# Patient Record
Sex: Female | Born: 1955 | Race: White | Hispanic: No | Marital: Married | Smoking: Former smoker
Health system: Southern US, Community
[De-identification: ages and names within clinical notes are randomized; demographics above are authoritative.]

## PROBLEM LIST (undated history)

## (undated) DIAGNOSIS — E559 Vitamin D deficiency, unspecified: Secondary | ICD-10-CM

## (undated) DIAGNOSIS — E785 Hyperlipidemia, unspecified: Secondary | ICD-10-CM

## (undated) DIAGNOSIS — E0500 Thyrotoxicosis with diffuse goiter without thyrotoxic crisis or storm: Secondary | ICD-10-CM

## (undated) DIAGNOSIS — M7070 Other bursitis of hip, unspecified hip: Secondary | ICD-10-CM

## (undated) DIAGNOSIS — M25562 Pain in left knee: Secondary | ICD-10-CM

## (undated) DIAGNOSIS — I739 Peripheral vascular disease, unspecified: Secondary | ICD-10-CM

## (undated) DIAGNOSIS — I714 Abdominal aortic aneurysm, without rupture, unspecified: Secondary | ICD-10-CM

## (undated) DIAGNOSIS — I809 Phlebitis and thrombophlebitis of unspecified site: Secondary | ICD-10-CM

## (undated) DIAGNOSIS — Z86010 Personal history of colonic polyps: Secondary | ICD-10-CM

## (undated) DIAGNOSIS — E039 Hypothyroidism, unspecified: Secondary | ICD-10-CM

## (undated) DIAGNOSIS — M1990 Unspecified osteoarthritis, unspecified site: Secondary | ICD-10-CM

## (undated) DIAGNOSIS — Z0000 Encounter for general adult medical examination without abnormal findings: Secondary | ICD-10-CM

## (undated) DIAGNOSIS — I10 Essential (primary) hypertension: Secondary | ICD-10-CM

## (undated) DIAGNOSIS — R700 Elevated erythrocyte sedimentation rate: Secondary | ICD-10-CM

## (undated) DIAGNOSIS — I779 Disorder of arteries and arterioles, unspecified: Secondary | ICD-10-CM

## (undated) HISTORY — DX: Pain in left knee: M25.562

## (undated) HISTORY — DX: Encounter for general adult medical examination without abnormal findings: Z00.00

## (undated) HISTORY — DX: Disorder of arteries and arterioles, unspecified: I77.9

## (undated) HISTORY — DX: Thyrotoxicosis with diffuse goiter without thyrotoxic crisis or storm: E05.00

## (undated) HISTORY — DX: Personal history of colonic polyps: Z86.010

## (undated) HISTORY — PX: OOPHORECTOMY: SHX86

## (undated) HISTORY — DX: Abdominal aortic aneurysm, without rupture: I71.4

## (undated) HISTORY — DX: Unspecified osteoarthritis, unspecified site: M19.90

## (undated) HISTORY — DX: Phlebitis and thrombophlebitis of unspecified site: I80.9

## (undated) HISTORY — DX: Elevated erythrocyte sedimentation rate: R70.0

## (undated) HISTORY — DX: Essential (primary) hypertension: I10

## (undated) HISTORY — DX: Vitamin D deficiency, unspecified: E55.9

## (undated) HISTORY — DX: Other bursitis of hip, unspecified hip: M70.70

## (undated) HISTORY — DX: Abdominal aortic aneurysm, without rupture, unspecified: I71.40

## (undated) HISTORY — PX: COLONOSCOPY: SHX174

## (undated) HISTORY — DX: Hyperlipidemia, unspecified: E78.5

## (undated) HISTORY — DX: Peripheral vascular disease, unspecified: I73.9

## (undated) HISTORY — PX: POLYPECTOMY: SHX149

## (undated) HISTORY — DX: Hypothyroidism, unspecified: E03.9

---

## 1998-06-29 ENCOUNTER — Other Ambulatory Visit: Admission: RE | Admit: 1998-06-29 | Discharge: 1998-06-29 | Payer: Self-pay | Admitting: *Deleted

## 1998-11-30 ENCOUNTER — Other Ambulatory Visit: Admission: RE | Admit: 1998-11-30 | Discharge: 1998-11-30 | Payer: Self-pay | Admitting: *Deleted

## 1999-06-26 ENCOUNTER — Other Ambulatory Visit: Admission: RE | Admit: 1999-06-26 | Discharge: 1999-06-26 | Payer: Self-pay | Admitting: *Deleted

## 1999-07-10 ENCOUNTER — Ambulatory Visit (HOSPITAL_COMMUNITY): Admission: RE | Admit: 1999-07-10 | Discharge: 1999-07-10 | Payer: Self-pay | Admitting: *Deleted

## 1999-07-10 ENCOUNTER — Encounter: Payer: Self-pay | Admitting: *Deleted

## 1999-10-02 HISTORY — PX: OTHER SURGICAL HISTORY: SHX169

## 1999-12-13 ENCOUNTER — Other Ambulatory Visit: Admission: RE | Admit: 1999-12-13 | Discharge: 1999-12-13 | Payer: Self-pay | Admitting: *Deleted

## 2000-06-20 ENCOUNTER — Ambulatory Visit (HOSPITAL_BASED_OUTPATIENT_CLINIC_OR_DEPARTMENT_OTHER): Admission: RE | Admit: 2000-06-20 | Discharge: 2000-06-20 | Payer: Self-pay | Admitting: Oral Surgery

## 2000-07-05 ENCOUNTER — Other Ambulatory Visit: Admission: RE | Admit: 2000-07-05 | Discharge: 2000-07-05 | Payer: Self-pay | Admitting: *Deleted

## 2000-11-27 ENCOUNTER — Encounter: Payer: Self-pay | Admitting: *Deleted

## 2000-11-27 ENCOUNTER — Ambulatory Visit (HOSPITAL_COMMUNITY): Admission: RE | Admit: 2000-11-27 | Discharge: 2000-11-27 | Payer: Self-pay | Admitting: *Deleted

## 2001-02-12 ENCOUNTER — Other Ambulatory Visit: Admission: RE | Admit: 2001-02-12 | Discharge: 2001-02-12 | Payer: Self-pay | Admitting: *Deleted

## 2001-07-09 ENCOUNTER — Other Ambulatory Visit: Admission: RE | Admit: 2001-07-09 | Discharge: 2001-07-09 | Payer: Self-pay | Admitting: *Deleted

## 2002-09-03 ENCOUNTER — Other Ambulatory Visit: Admission: RE | Admit: 2002-09-03 | Discharge: 2002-09-03 | Payer: Self-pay | Admitting: *Deleted

## 2002-11-09 ENCOUNTER — Ambulatory Visit: Admission: RE | Admit: 2002-11-09 | Discharge: 2002-11-09 | Payer: Self-pay | Admitting: Internal Medicine

## 2002-11-11 ENCOUNTER — Ambulatory Visit: Admission: RE | Admit: 2002-11-11 | Discharge: 2002-11-11 | Payer: Self-pay | Admitting: Internal Medicine

## 2002-11-11 ENCOUNTER — Encounter: Payer: Self-pay | Admitting: Cardiology

## 2002-11-30 DIAGNOSIS — Z860100 Personal history of colon polyps, unspecified: Secondary | ICD-10-CM

## 2002-11-30 HISTORY — DX: Personal history of colonic polyps: Z86.010

## 2002-11-30 HISTORY — DX: Personal history of colon polyps, unspecified: Z86.0100

## 2003-04-14 ENCOUNTER — Encounter: Payer: Self-pay | Admitting: *Deleted

## 2003-04-14 ENCOUNTER — Encounter: Admission: RE | Admit: 2003-04-14 | Discharge: 2003-04-14 | Payer: Self-pay | Admitting: *Deleted

## 2003-07-05 ENCOUNTER — Ambulatory Visit (HOSPITAL_COMMUNITY): Admission: RE | Admit: 2003-07-05 | Discharge: 2003-07-05 | Payer: Self-pay | Admitting: Obstetrics and Gynecology

## 2003-07-05 ENCOUNTER — Ambulatory Visit (HOSPITAL_BASED_OUTPATIENT_CLINIC_OR_DEPARTMENT_OTHER): Admission: RE | Admit: 2003-07-05 | Discharge: 2003-07-05 | Payer: Self-pay | Admitting: Obstetrics and Gynecology

## 2003-09-16 ENCOUNTER — Other Ambulatory Visit: Admission: RE | Admit: 2003-09-16 | Discharge: 2003-09-16 | Payer: Self-pay | Admitting: *Deleted

## 2003-12-17 ENCOUNTER — Ambulatory Visit (HOSPITAL_COMMUNITY): Admission: RE | Admit: 2003-12-17 | Discharge: 2003-12-17 | Payer: Self-pay | Admitting: *Deleted

## 2004-01-21 ENCOUNTER — Ambulatory Visit (HOSPITAL_COMMUNITY): Admission: RE | Admit: 2004-01-21 | Discharge: 2004-01-21 | Payer: Self-pay | Admitting: Internal Medicine

## 2004-02-01 ENCOUNTER — Emergency Department (HOSPITAL_COMMUNITY): Admission: EM | Admit: 2004-02-01 | Discharge: 2004-02-01 | Payer: Self-pay | Admitting: Emergency Medicine

## 2004-06-13 ENCOUNTER — Ambulatory Visit (HOSPITAL_COMMUNITY): Admission: RE | Admit: 2004-06-13 | Discharge: 2004-06-13 | Payer: Self-pay | Admitting: Internal Medicine

## 2004-07-19 ENCOUNTER — Encounter: Admission: RE | Admit: 2004-07-19 | Discharge: 2004-07-19 | Payer: Self-pay | Admitting: Orthopedic Surgery

## 2004-09-19 ENCOUNTER — Other Ambulatory Visit: Admission: RE | Admit: 2004-09-19 | Discharge: 2004-09-19 | Payer: Self-pay | Admitting: *Deleted

## 2004-10-17 ENCOUNTER — Encounter: Admission: RE | Admit: 2004-10-17 | Discharge: 2004-10-17 | Payer: Self-pay | Admitting: *Deleted

## 2005-05-16 ENCOUNTER — Emergency Department (HOSPITAL_COMMUNITY): Admission: EM | Admit: 2005-05-16 | Discharge: 2005-05-16 | Payer: Self-pay | Admitting: Emergency Medicine

## 2005-09-18 ENCOUNTER — Other Ambulatory Visit: Admission: RE | Admit: 2005-09-18 | Discharge: 2005-09-18 | Payer: Self-pay | Admitting: Obstetrics and Gynecology

## 2005-10-08 ENCOUNTER — Encounter: Admission: RE | Admit: 2005-10-08 | Discharge: 2005-10-08 | Payer: Self-pay | Admitting: Obstetrics and Gynecology

## 2005-11-16 ENCOUNTER — Encounter (INDEPENDENT_AMBULATORY_CARE_PROVIDER_SITE_OTHER): Payer: Self-pay | Admitting: Specialist

## 2005-11-16 ENCOUNTER — Ambulatory Visit (HOSPITAL_COMMUNITY): Admission: RE | Admit: 2005-11-16 | Discharge: 2005-11-16 | Payer: Self-pay | Admitting: Obstetrics and Gynecology

## 2006-01-15 ENCOUNTER — Encounter (INDEPENDENT_AMBULATORY_CARE_PROVIDER_SITE_OTHER): Payer: Self-pay | Admitting: Specialist

## 2006-01-15 ENCOUNTER — Inpatient Hospital Stay (HOSPITAL_COMMUNITY): Admission: RE | Admit: 2006-01-15 | Discharge: 2006-01-16 | Payer: Self-pay | Admitting: Obstetrics and Gynecology

## 2006-03-29 ENCOUNTER — Ambulatory Visit (HOSPITAL_COMMUNITY): Admission: RE | Admit: 2006-03-29 | Discharge: 2006-03-29 | Payer: Self-pay | Admitting: Internal Medicine

## 2006-10-17 ENCOUNTER — Encounter: Admission: RE | Admit: 2006-10-17 | Discharge: 2006-10-17 | Payer: Self-pay | Admitting: Obstetrics and Gynecology

## 2007-09-10 ENCOUNTER — Ambulatory Visit: Payer: Self-pay | Admitting: Vascular Surgery

## 2007-10-02 HISTORY — PX: VAGINAL HYSTERECTOMY: SUR661

## 2007-12-09 ENCOUNTER — Encounter: Admission: RE | Admit: 2007-12-09 | Discharge: 2007-12-09 | Payer: Self-pay | Admitting: Internal Medicine

## 2008-06-22 ENCOUNTER — Ambulatory Visit (HOSPITAL_COMMUNITY): Admission: RE | Admit: 2008-06-22 | Discharge: 2008-06-22 | Payer: Self-pay | Admitting: Internal Medicine

## 2008-06-23 ENCOUNTER — Telehealth: Payer: Self-pay | Admitting: Internal Medicine

## 2008-06-30 ENCOUNTER — Ambulatory Visit: Payer: Self-pay | Admitting: Internal Medicine

## 2008-07-09 ENCOUNTER — Ambulatory Visit: Payer: Self-pay | Admitting: Internal Medicine

## 2008-09-21 ENCOUNTER — Ambulatory Visit: Payer: Self-pay | Admitting: Vascular Surgery

## 2009-01-06 DIAGNOSIS — E039 Hypothyroidism, unspecified: Secondary | ICD-10-CM

## 2009-01-07 ENCOUNTER — Encounter: Payer: Self-pay | Admitting: Cardiovascular Disease

## 2009-01-07 ENCOUNTER — Ambulatory Visit: Payer: Self-pay | Admitting: Cardiovascular Disease

## 2009-01-11 ENCOUNTER — Encounter: Admission: RE | Admit: 2009-01-11 | Discharge: 2009-01-11 | Payer: Self-pay | Admitting: Internal Medicine

## 2009-01-20 ENCOUNTER — Ambulatory Visit: Payer: Self-pay

## 2009-01-27 ENCOUNTER — Telehealth: Payer: Self-pay | Admitting: Cardiovascular Disease

## 2009-04-11 ENCOUNTER — Ambulatory Visit: Payer: Self-pay | Admitting: Cardiovascular Disease

## 2009-04-11 DIAGNOSIS — R0989 Other specified symptoms and signs involving the circulatory and respiratory systems: Secondary | ICD-10-CM | POA: Insufficient documentation

## 2009-04-25 ENCOUNTER — Telehealth (INDEPENDENT_AMBULATORY_CARE_PROVIDER_SITE_OTHER): Payer: Self-pay

## 2009-04-26 ENCOUNTER — Encounter: Payer: Self-pay | Admitting: Cardiology

## 2009-04-26 ENCOUNTER — Encounter: Payer: Self-pay | Admitting: Cardiovascular Disease

## 2009-04-26 ENCOUNTER — Ambulatory Visit: Payer: Self-pay

## 2009-07-26 ENCOUNTER — Ambulatory Visit: Payer: Self-pay | Admitting: Cardiovascular Disease

## 2009-08-16 ENCOUNTER — Telehealth (INDEPENDENT_AMBULATORY_CARE_PROVIDER_SITE_OTHER): Payer: Self-pay | Admitting: *Deleted

## 2009-09-12 ENCOUNTER — Telehealth: Payer: Self-pay | Admitting: Cardiovascular Disease

## 2009-09-28 ENCOUNTER — Ambulatory Visit: Payer: Self-pay

## 2009-09-28 ENCOUNTER — Encounter: Payer: Self-pay | Admitting: Cardiovascular Disease

## 2010-01-12 ENCOUNTER — Ambulatory Visit: Payer: Self-pay | Admitting: Cardiovascular Disease

## 2010-06-06 ENCOUNTER — Encounter: Payer: Self-pay | Admitting: Cardiovascular Disease

## 2010-06-06 ENCOUNTER — Ambulatory Visit: Payer: Self-pay

## 2010-08-05 ENCOUNTER — Ambulatory Visit: Payer: Self-pay | Admitting: Diagnostic Radiology

## 2010-08-05 ENCOUNTER — Emergency Department (HOSPITAL_BASED_OUTPATIENT_CLINIC_OR_DEPARTMENT_OTHER): Admission: EM | Admit: 2010-08-05 | Discharge: 2010-08-05 | Payer: Self-pay | Admitting: Emergency Medicine

## 2010-10-22 ENCOUNTER — Encounter: Payer: Self-pay | Admitting: Internal Medicine

## 2010-10-31 NOTE — Assessment & Plan Note (Signed)
Summary: per check out/sf   Visit Type:  Follow-up  CC:  No cardiac complains.  History of Present Illness: Lindsay Hodges is seen today for F/U of carotid disease , HTN and elevated lipids  She has 40-59% RICA and 60-79% left ICA stenosis and needs a F/U duplex in June.  She has had no TIA symptoms.  The losartan has brought her BP down well.  She thinks her thyroid is low again as he skin is dry and she feels tired.  She just had her labs checked at Dr Hardie Pulley office.  She denies SSCP, palpitations, edema or dyspnea  Current Problems (verified): 1)  Essential Hypertension, Benign  (ICD-401.1) 2)  Carotid Bruit, Left  (ICD-785.9) 3)  Leg Edema  (ICD-782.3) 4)  Chest Pain Unspecified  (ICD-786.50) 5)  Hematometra  (ICD-621.4) 6)  Menorrhagia  (ICD-626.2) 7)  Hypothyroidism  (ICD-244.9)  Current Medications (verified): 1)  Synthroid 75 Mcg Tabs (Levothyroxine Sodium) .Marland Kitchen.. 1 Tab By Mouth Once Daily Except Sat 50 Mcg 2)  Lipitor 10 Mg Tabs (Atorvastatin Calcium) .Marland Kitchen.. 1 Tab By Mouth Once Daily 3)  Estrace 0.5 Mg Tabs (Estradiol) .Marland Kitchen.. 1 Tab By Mouth Once Daily 4)  Aspirin 81 Mg  Tabs (Aspirin) .Marland Kitchen.. 1 Tab By Mouth Once Daily 5)  Losartan Potassium 50 Mg Tabs (Losartan Potassium) .... One Tablet By Mouth Once Daily  Allergies: 1)  ! Penicillin  Past History:  Past Medical History: Last updated: 04/11/2009 HEMATOMETRA (ICD-621.4) MENORRHAGIA (ICD-626.2) HYPOTHYROIDISM (ICD-244.9) Carotid Bruit Edema  Past Surgical History: Last updated: 01/06/2009 Jaw surgery.  Family History: Last updated: 04/11/2009 non-contributory  Social History: Last updated: 04/11/2009 Tobacco Use - Yes.  Alcohol Use - no Drug Use - no Works at [employer]  Review of Systems       Denies fever, malais, weight loss, blurry vision, decreased visual acuity, cough, sputum, SOB, hemoptysis, pleuritic pain, palpitaitons, heartburn, abdominal pain, melena, lower extremity edema, claudication, or  rash.   Vital Signs:  Patient profile:   55 year old female Height:      64 inches Weight:      163.31 pounds BMI:     28.13 Pulse rate:   75 / minute Pulse rhythm:   regular Resp:     18 per minute BP sitting:   146 / 64  (left arm) Cuff size:   large  Vitals Entered By: Kem Parkinson (January 12, 2010 4:43 PM)  Physical Exam  General:  Affect appropriate Healthy:  appears stated age HEENT: normal Neck supple with no adenopathy JVP normal no bruits no thyromegaly Lungs clear with no wheezing and good diaphragmatic motion Heart:  S1/S2 no murmur,rub, gallop or click PMI normal Abdomen: benighn, BS positve, no tenderness, no AAA no bruit.  No HSM or HJR Distal pulses intact with no bruits No edema Neuro non-focal Skin warm and dry    Impression & Recommendations:  Problem # 1:  ESSENTIAL HYPERTENSION, BENIGN (ICD-401.1) Improved on ARB Her updated medication list for this problem includes:    Aspirin 81 Mg Tabs (Aspirin) .Marland Kitchen... 1 tab by mouth once daily    Losartan Potassium 50 Mg Tabs (Losartan potassium) ..... One tablet by mouth once daily  Problem # 2:  CAROTID BRUIT, LEFT (ICD-785.9) F/U duplex in June Continue antiplatlet Rx Orders: Carotid Duplex (Carotid Duplex)  Problem # 3:  HYPOTHYROIDISM (ICD-244.9) F/U Dr Elisabeth Most  Improved TSH will help lipid profile Her updated medication list for this problem includes:    Synthroid  75 Mcg Tabs (Levothyroxine sodium) .Marland Kitchen... 1 tab by mouth once daily except sat 50 mcg  Problem # 4:  MIXED HYPERLIPIDEMIA (ICD-272.2) Continue statin.  Labs per Dr Elisabeth Most.  Target LDL less than 70 Her updated medication list for this problem includes:    Lipitor 10 Mg Tabs (Atorvastatin calcium) .Marland Kitchen... 1 tab by mouth once daily  Patient Instructions: 1)  Your physician recommends that you schedule a follow-up appointment in: ONE YEAR 2)  Your physician has requested that you have a carotid duplex. This test is an ultrasound of  the carotid arteries in your neck. It looks at blood flow through these arteries that supply the brain with blood. Allow one hour for this exam. There are no restrictions or special instructions.

## 2010-12-22 ENCOUNTER — Encounter: Payer: Self-pay | Admitting: *Deleted

## 2011-01-03 ENCOUNTER — Ambulatory Visit (INDEPENDENT_AMBULATORY_CARE_PROVIDER_SITE_OTHER): Payer: BC Managed Care – PPO | Admitting: Cardiovascular Disease

## 2011-01-03 ENCOUNTER — Encounter: Payer: Self-pay | Admitting: Cardiovascular Disease

## 2011-01-03 DIAGNOSIS — E782 Mixed hyperlipidemia: Secondary | ICD-10-CM

## 2011-01-03 DIAGNOSIS — R079 Chest pain, unspecified: Secondary | ICD-10-CM

## 2011-01-03 DIAGNOSIS — R0989 Other specified symptoms and signs involving the circulatory and respiratory systems: Secondary | ICD-10-CM

## 2011-01-03 DIAGNOSIS — I10 Essential (primary) hypertension: Secondary | ICD-10-CM

## 2011-01-03 NOTE — Assessment & Plan Note (Signed)
Labs with new primary Target LDL 100 or less

## 2011-01-03 NOTE — Assessment & Plan Note (Signed)
Continue home monitoring and meds.

## 2011-01-03 NOTE — Progress Notes (Signed)
Lindsay Hodges is seen today for F/U of carotid disease , HTN and elevated lipids  She has 40-59% RICA and 60-79% left ICA stenosis and needs a F/U duplex  now  She has had no TIA symptoms.  The losartan has brought her BP down well.  She thinks her thyroid is low again as he skin is dry and she feels tired.  She just had her labs checked at Dr Hardie Pulley office.  She denies SSCP, palpitations, edema or dyspnea  She has a white coat component but BP's good at home  Instructed on low sodium diet  Now seeing Dr Sherlyn Lick at [facility] as primary.  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  Encouraged her to get lab work with new primary next month  ROS: Denies fever, malais, weight loss, blurry vision, decreased visual acuity, cough, sputum, SOB, hemoptysis, pleuritic pain, palpitaitons, heartburn, abdominal pain, melena, lower extremity edema, claudication, or rash.   General: Affect appropriate Healthy:  appears stated age HEENT: normal Neck supple with no adenopathy JVP normal right  bruits no thyromegaly Lungs clear with no wheezing and good diaphragmatic motion Heart:  S1/S2 no murmur,rub, gallop or click PMI normal Abdomen: benighn, BS positve, no tenderness, no AAA no bruit.  No HSM or HJR Distal pulses intact with no bruits No edema Neuro non-focal Skin warm and dry No muscular weakness   Current Outpatient Prescriptions  Medication Sig Dispense Refill  . aspirin 81 MG tablet Take 81 mg by mouth daily.        Marland Kitchen atorvastatin (LIPITOR) 10 MG tablet Take 10 mg by mouth daily.        Marland Kitchen estradiol (ESTRACE) 0.5 MG tablet Take 1 mg by mouth daily.       Marland Kitchen levothyroxine (SYNTHROID, LEVOTHROID) 75 MCG tablet Take 75 mcg by mouth daily. Except for Saturday take 50 mcg.       . losartan (COZAAR) 50 MG tablet Take 50 mg by mouth daily.          Allergies  Penicillins  Electrocardiogram:  Assessment and Plan

## 2011-01-03 NOTE — Assessment & Plan Note (Signed)
No neuro symptoms  ASA  F/U duplex

## 2011-01-03 NOTE — Patient Instructions (Signed)
Your physician has requested that you have a carotid duplex. This test is an ultrasound of the carotid arteries in your neck. It looks at blood flow through these arteries that supply the brain with blood. Allow one hour for this exam. There are no restrictions or special instructions.  F/U Dr Eden Emms 1 year.

## 2011-01-15 ENCOUNTER — Other Ambulatory Visit: Payer: Self-pay | Admitting: Cardiology

## 2011-01-15 DIAGNOSIS — I6529 Occlusion and stenosis of unspecified carotid artery: Secondary | ICD-10-CM

## 2011-01-16 ENCOUNTER — Encounter (INDEPENDENT_AMBULATORY_CARE_PROVIDER_SITE_OTHER): Payer: BC Managed Care – PPO | Admitting: *Deleted

## 2011-01-16 ENCOUNTER — Other Ambulatory Visit: Payer: Self-pay | Admitting: *Deleted

## 2011-01-16 DIAGNOSIS — I6529 Occlusion and stenosis of unspecified carotid artery: Secondary | ICD-10-CM

## 2011-01-18 ENCOUNTER — Encounter: Payer: Self-pay | Admitting: Cardiovascular Disease

## 2011-02-13 NOTE — Procedures (Signed)
CAROTID DUPLEX EXAM   INDICATION:  Follow-up evaluation of known carotid artery disease.  Previous study performed on September 04, 2006 revealed a 1-39% right ICA  stenosis and a 60-79% left ICA stenosis.   HISTORY:  Diabetes:  No.  Cardiac:  No.  Hypertension:  No.  Smoking:  Quit in 2007.  Previous Surgery:  No.  CV History:  Patient has a history of left eye visual disturbance in  April, 2005.  Recently, she has complained of black spots in her right  eye approximately once a week, which last for an hour at a time.  Amaurosis Fugax No, Paresthesias No, Hemiparesis No                                       RIGHT             LEFT  Brachial systolic pressure:         128               128  Brachial Doppler waveforms:         Triphasic         Triphasic  Vertebral direction of flow:        Antegrade         Antegrade  DUPLEX VELOCITIES (cm/sec)  CCA peak systolic                   111               99  ECA peak systolic                   158               133  ICA peak systolic                   104               235  ICA end diastolic                   30                70  PLAQUE MORPHOLOGY:                  Mixed             Mixed  PLAQUE AMOUNT:                      Mild              Moderate  PLAQUE LOCATION:                    Proximal ICA      Proximal ICA   IMPRESSION:  1. 20-39% right internal carotid artery stenosis.  2. 60-79% left internal carotid artery stenosis.  3. No significant change from previous study performed on September 04, 2006.   ___________________________________________  Di Kindle. Edilia Bo, M.D.   MC/MEDQ  D:  09/10/2007  T:  09/11/2007  Job:  [id number]

## 2011-02-13 NOTE — Procedures (Signed)
CAROTID DUPLEX EXAM   INDICATION:  Follow-up known carotid artery disease.   HISTORY:  Diabetes:  No.  Cardiac:  No.  Hypertension:  No.  Smoking:  Quit.  Previous Surgery:  None.  CV History:  Amaurosis Fugax No, Paresthesias No, Hemiparesis No.                                       RIGHT             LEFT  Brachial systolic pressure:         168               138  Brachial Doppler waveforms:         Biphasic          Biphasic  Vertebral direction of flow:        Antegrade         Antegrade  DUPLEX VELOCITIES (cm/sec)  CCA peak systolic                   100               83  ECA peak systolic                   143               132  ICA peak systolic                   140               270  ICA end diastolic                   44                79  PLAQUE MORPHOLOGY:                  Heterogeneous     Heterogeneous  PLAQUE AMOUNT:                      Moderate          Moderate to severe  PLAQUE LOCATION:                    ICA and ECA       ICA and ECA   IMPRESSION:  1. A 60-79% stenosis noted in the left ICA.  2. A 40-59% stenosis noted in the right ICA.  3. Antegrade bilateral vertebral arteries.   ___________________________________________  Di Kindle. Edilia Bo, M.D.   MG/MEDQ  D:  09/21/2008  T:  09/21/2008  Job:  [id number]   cc:   Di Kindle. Edilia Bo, M.D.

## 2011-02-13 NOTE — Assessment & Plan Note (Signed)
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 Elisabeth Most for left  arm heaviness.  She has known vascular disease.  She has 60-79% left ICA  stenosis that is followed by Dr. Edilia Bo, this has been for about 4  years.  She describes having a vasculitity about 4 years ago with a sed  rate of about 280.  Apparently, she was not placed on prednisone.  I  suspect she had some sort of arteritis, but she says it resolved  spontaneously.  She lost a lot of weight at the time.   It bothers me a little bit, there was no unifying diagnosis.   Her left arm heaviness has been going on for a few months, it seems to  be getting worse.  It is not always exertional.  It can occur with  sitting, it is not particularly positional.  There is an aching  sensation that does radiate to her chest sometimes.  There is no right-  sided radiation.  It is not associated with shortness of breath or  diaphoresis.  Looking back through Dr. Adele Dan records, he does not  indicate any evidence of left subclavian steal.  The patient also had an  MRI and MRA on November 27, 2006, which I reviewed and there was no  evidence of intracerebral flow signal abnormalities.   Her last duplex that I have from Dr. Adele Dan office is 2007 and  vertebral flow was antegrade bilaterally.   REVIEW OF SYSTEMS:  Otherwise negative.   PAST MEDICAL HISTORY:  Remarkable for previous jaw surgery, previous  hysterectomy.  This episode of elevated sed rate, weight loss likely a  vasculitity.  Carotid bruits.  She has no documented coronary artery  disease.   ALLERGIES:  She is allergic to PENICILLIN.   SOCIAL HISTORY:   She tries to walk daily 30-45 minutes and bikes 3 times  a week.  She is happily married with 3 older children including a set of  twins.  She does not smoke or drink.  The patient is indicated as a  Associate Professor.   FAMILY HISTORY:  Father died at age 35, question heart related.  Mother  is still alive.   MEDICATIONS:  1. Synthroid 75 mcg a day.  2. Lipitor 10 a day.  3. Estrace.   PHYSICAL EXAMINATION:  GENERAL:  Remarkable for healthy-appearing female  in no distress.  VITAL SIGNS:  Blood pressure is 120/70 in each arm, pulse is 57 and  regular.  There is no pulse discrepancy, faint left carotid bruit,  lymphadenopathy, or JVP elevation.  LUNGS:  Clear.  Good diaphragmatic motion.  No wheezing.  S1 and S2.  CARDIAC:  Normal heart sounds.  PMI normal.  ABDOMEN:  Benign.  Bowel sounds positive.  No AAA, no tenderness, no  bruit.  No hepatosplenomegaly or hepatojugular reflux EXTREMITIES:  Distal pulses are intact.  No edema.  NEUROLOGIC:  Nonfocal.  SKIN:  Warm and dry.  MUSCULOSKELETAL:  No muscular weakness.   EKG shows sinus rhythm with nonspecific T-wave changes in lead III.   IMPRESSION:  1. Arm heaviness in the patient with known vascular disease,      nonspecific inferior EKG changes.  Followup exercise stress      Myoview.  Would recommend baby aspirin.  2. Hypercholesterolemia in the setting of carotid disease.  Continue      Lipitor.  Lipid and liver profile in 6 months.  LDL goal 100 or      less.  3. Hypothyroidism.  Continue Synthroid.  TSH and T4 per primary care      MD.  4. Postmenopausal.  Continue Estrace.   While the patient is here, we will check upper extremity arterial  Doppler, just to make sure there is no evidence of subclavian steal  progression.     Noralyn Pick. Eden Emms, MD, [facility]  Electronically Signed    PCN/MedQ  DD: 01/07/2009  DT: 01/07/2009  Job #: [id number]   cc:   Lovenia Kim, D.O.

## 2011-02-16 NOTE — Op Note (Signed)
   NAME:  Lindsay Hodges, Lindsay Hodges [facility]                         ACCOUNT NO.:  [id number]   MEDICAL RECORD NO.:  [id number]                   PATIENT TYPE:  AMB   LOCATION:  [facility]                                 FACILITY:  [facility]   PHYSICIAN:  Cynthia P. Romine, M.D.             DATE OF BIRTH:  [birth year]   DATE OF PROCEDURE:  07/05/2003  DATE OF DISCHARGE:                                 OPERATIVE REPORT   PREOPERATIVE DIAGNOSIS:  Menorrhagia.   POSTOPERATIVE DIAGNOSIS:  Menorrhagia.   PROCEDURE:  Endometrial ablation using the HydroThermAblator.   SURGEON:  Cynthia P. Romine, M.D.   ANESTHESIA:  General by LMA.   ESTIMATED BLOOD LOSS:  Minimal.   COMPLICATIONS:  None.   DESCRIPTION OF PROCEDURE:  The patient was taken to the operating room and  after the induction of adequate general anesthesia was placed in the dorsal  lithotomy position and prepped and draped in the usual fashion.  The bladder  was drained with a red rubber catheter.  The cervix was grasped on its  anterior lip with a single-tooth tenaculum.  The uterus sounded to 7 cm.  The cervix was dilated to a #25 Shawnie Pons.  The hysteroscope could get into the  endocervical canal but could not be passed easily.  It was felt due to the  angle of the canal.  A second single-tooth tenaculum was placed on the  posterior lip of the cervix to straighten out the canal, and the scope could  then be introduced easily.  Photographic documentation was taken to document  proper placement of the scope inside the endometrium.  Endometrial ablation  was then carried out using the HydroThermAblator according to the  [facility] specifications.  No complications occurred.  Postoperative  photographic documentation was taken of the endometrium after the ablation.  The instruments were removed from the vagina and the procedure was  terminated.  The patient tolerated it well, went in satisfactory condition  to postanesthesia recovery.                                   Cynthia P. Romine, M.D.    CPR/MEDQ  D:  07/05/2003  T:  07/05/2003  Job:  [id number]

## 2011-02-16 NOTE — Op Note (Signed)
[place]  Patient:    Lindsay Hodges, FORONDA [facility]                      MRN: [id number] Proc. Date: 06/20/00 Adm. Date:  [id number] Attending:  Leonie Man                           Operative Report  PREOPERATIVE DIAGNOSES: 1. 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 2. Mandibular sagittal deficiency with functional deformity.  POSTOPERATIVE DIAGNOSES: 1. 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 2. Mandibular sagittal deficiency with functional deformity.  PROCEDURE: 1. LeFort I maxillary osteotomy with superior repositioning. 2. Bilateral sagittal osteotomy of the mandible.  ANESTHESIA:  General.  SURGEON:  Dora Sims, M.D. and Hinton Dyer, D.D.S.  ESTIMATED BLOOD LOSS:  300 cc.  CONDITION AFTER SURGERY:  Good.  DESCRIPTION OF PROCEDURE:  Following preoperative medication, the patient was brought to the operating room in the supine position, in which she remained throughout the whole procedure.  She was intubated by a right nasal endotracheal tube and then turned 90 degrees to the anesthesia cart.  She was then prepped and draped in the usual fashion for an orthognathic procedure, and the mouth was suctioned out and packed open with a moist open 4 x 4 gauze around the endotracheal tube.  A round bur and copious irrigation were used to do an enameloplasty on two maxillary teeth.  Once this was completed, the split was checked for fit, and was found to fit well.  Xylocaine 2% with 1:100,000 epinephrine was infiltrated in the area of the right and left maxilla, a total of 6 Carpules were used 1.5 cc per Carpule.  A #15 blade was then used to make an incision from the distal of the first molar on the patients left side to the canine area on the right side.  The incision was then tied to the mucobuccal fold.  A full thickness mucoperiosteal flap was then elevated, exposing the lateral maxillary  wall.  Both nasal apertures were exposed and the anterior nasal spine was then fully exposed.  Two bur holes were made 10.0 mm apart in the first molar area on the left side.  Attention was then turned to the right side and a #15 blade was used to make an incision from the distal left first molar to the original incision.  Again a full thickness mucoperiosteal flap was elevated using a periosteal elevator. The incision was carried down to the pterygo-plate area bilaterally.  Then now going 3.0 mm above the roots of the teeth, the 10.0 mm reference points were placed in the first molar area on the right side, and once completed with a retractor in place, a reciprocating saw was used to make a cut from the pterygo-plate area to the nasal aperature on the right side, and then a second cut was made 3.5 mm above the first one, tapering down to the nasal aperture area, so that there was just a thin bur cut in the anterior region.  Attention was then turned to the left side and the same thing was done, doing a 3.5 mm cut above the original cut, tapering down to the saw cut in the anterior region.  Both areas of bone were removed with pickups, and the nasal septum was then cut through using a guarded osteotome.  Both pterygo-plates were then cut using a posterior plate osteotome, and  1.5 cc was infiltrated into the area of each pterygomaxillary canal, a total of 3 cc infiltrated. The soft tissues of the nose were elevated off of the floor of the nose in a lateral area when doing the saw cuts, and this was extended.  Now the maxilla was being pushed in an inferior direction, and was found to easily be brought down, suctioning out the clots as it went down.  The soft tissues of the nose were intact and did not need to be repaired.  Once the maxilla was fully freed up, the maxilla was then trimmed back with a side-cutting rongeur to remove any bony interferences, and any sharp areas.  Once completed,  the maxilla was placed in its predetermined position, and using the measured holes, they were measured so that the maxilla went up 3.5 mm posterior, right and left sides. A #24 gauge stainless steel wire was passed both holes on the right side, and both holes on the left side, and then tacked down, holding the maxilla in place.  In the anterior region, two L-shaped plates were placed bent to fit passively, and then four screws were placed on either side, holding the maxilla in place.  These holes were drilled to either a 5.0 or a 7.0 mm depth. All eight screws were found to be extremely stable, and the maxilla firmly in place.  Prior to downfracturing the maxilla, a split was made that was fabricated to th maxillary arch, placed, and wired to the maxillary arch wire using #26 gauge stainless steel wire.  A second overlay splint went on top of that as a check for the occlusion, and with the occlusion stabilized and the patient in intermaxillary fixation, the above maxillary cuts had been performed.  Now the intermaxillary fixation wires were cut, and the maxilla and mandible were articulated to make sure that the midlines were on, and that the maxilla was in the proper position.  With everything in good position, the soft tissues were closed primarily with #3-0 chromic sutures, beginning on the left side, ending in the midline on both sides.  A V-Y closure was also accomplished prior to doing this.  Attention was then turned to the mandible, and again 3 cc of 2% Xylocaine and 1:100,000 epinephrine was infiltrated on each side of the mandible in the ramus area on the medial aspect, along the ascending ramus.  The #15 blade was used to make an incision along the ascending ramus and a full thickness mucoperiosteal flap was elevated in the buccal lingual direction.  The medial aspect of the mandible was reflected so that the perialveolar nerve could be seen entering the mandible, and with a  Selden retractor protecting the soft tissues, a reciprocating saw was used to make a cut horizontal to the occlusal plane at a 45-degree angle.  Once this cut was complete, the same saw was used  to make a cut down the ascending ramus to the first and second molar region. The dissection was then carried down to allow a channel retractor to go around the inferior border, and again a cut was made from the inferior border up to the original osteotomy site.  Once completed, a spatula osteotome was tapped into position, and the mandible began splaying in a buccal lingual direction. Once the cut was finished using a Smith spread and a Market researcher, the nerve was found to be intact in the segment containing the teeth, now referred to as a distal segment.  The area  was irrigated out and packed off gently, and attention turned to the left side where the same procedure was done.  Again, the #15 blade was used to make an incision over the ascending ramus, and a full thickness mucoperiosteal flap was elevated, exposing the buccal and lingual aspect of the mandible.  Again the medial aspect was reflected so as to show the inferior alveolar nerve, and with the Selden in place, the reciprocating saw was used to make a 45-degree cut parallel to the occlusal plane halfway through the mandible.  Then the reciprocating saw was used to make a cut down the ascending ramus, and then to the area of the first and second molar region.  A vertical osteotomy was created with the same reciprocating saw after the placement of the channel retractor on the inferior border.  Once completed, spatula osteotomies were tapped along the incision until the mandible began splaying.  Again, the [employer] and a Market researcher were used to finish the split, and the nerve was found to be intact in the distal segment.  Once completed, the area was copiously irrigated out bilaterally, and the patients proximal segment was  pushed superior and posterior using a digital pressure extraorally and a gauge directed intraorally.  An Allis clamp was then used to clamp the two segments together on the right side, and now the area was checked for screw placement.  The patient was in intermaxillary fixation during this time using #24 gauge stainless steel wires.  Because a good right angle screw could not be placed, because of the soft tissues, a small stab incision was made on the skin, and blunt dissection was used to carry the dissection down to the osteotomy site. With the bur guard in place, a hole was drilled and then a 13.0 mm screw was screwed into place.  Two other holes were drilled distal to the first one, and all were tested, to make sure that they were stable.  Once the screws were screwed down into position, the attention was then turned to the left side, where a retractor was positioned, and three screws were placed using two 13.0 mm and one 11.0 mm screw into position.  Once these screws were screwed down, all six screws appeared to be firm.  Both osteotomy sites were tested for any mobility, and stability of the screws.  Then the soft tissues were closed with #3-0 chromic sutures.  The intermaxillary fixation wires were cut, and the occlusion was checked for stability.  The patient rotated nicely into her class one occlusion, and then once completed, the patient was extubated on the table, after the removal of the throat pack. A pressure dressing was placed around the patients face, and she was returned to the recovery room in good condition. DD:  06/20/00 TD:  06/20/00 Job: [id number]

## 2011-02-16 NOTE — Op Note (Signed)
NAME:  Lindsay Hodges, PINKUS NO.:  [id number]   MEDICAL RECORD NO.:  [id number]          PATIENT TYPE:  AMB   LOCATION:  SDC                           FACILITY:  [facility]   PHYSICIAN:  Zelphia Cairo, MD    DATE OF BIRTH:  16-Dec-1955   DATE OF PROCEDURE:  11/16/2005  DATE OF DISCHARGE:                                 OPERATIVE REPORT   PREOPERATIVE DIAGNOSES:  1.  Myometria.  2.  Right ovarian cyst.   POSTOPERATIVE DIAGNOSES:  1.  Myometria.  2.  Right ovarian cyst.   OPERATION/PROCEDURE:  1.  Hysteroscopy.  2.  Laparoscopic right salpingo-oophorectomy.   SURGEON:  Zelphia Cairo, M.D.   ANESTHESIA:  General.   SPECIMENS:  Right tube and ovary.   ESTIMATED BLOOD LOSS:  Minimal.   COMPLICATIONS:  Obstructed endometrial cavity most likely by anterior  fibroids.  Unable to perform D&C.   CONDITION:  The patient extubated and taken to the recovery room in stable  condition.   DESCRIPTION OF PROCEDURE:  The patient was taken to the operating room where  general anesthesia was obtained.  She was placed in the dorsal lithotomy  position using Allen stirrups.  She was prepped and draped in the sterile  fashion and the bladder was emptied with a red rubber catheter.  A Graves  speculum was placed in the uterus and a single-tooth tenaculum placed on the  anterior lip of the cervix.  Upon attempt to sound the uterus, it was  evident that there were an obstruction to the endometrial cavity.  I was  unable to sound the uterus.  I was also unable to dilate the cavity.  I  tried using the hysteroscope to enter the endometrial cavity under direct  visualization.  However, due to stenosis was unable to perform the procedure  safely.  The hysteroscope was then removed and the uterine manipulator was  placed in the uterus.  A single-tooth tenaculum and speculum were removed  from the vagina and our attention was then turned to the abdomen.   A small infraumbilical skin  incision was made with the scalpel and a Veress  needle was inserted without difficulty.  Pneumoperitoneum was achieved and  the Veress needle was removed from the pelvis.  The 11 mm trocar was then  inserted into the abdomen and the laparoscope was used to visualize the  pelvis.  Attention was placed just below entry to insure there was an  atraumatic entry and no injuries were noted.  Next, a small suprapubic  incision was made with the scalpel and a 5 mm trocar was placed under direct  visualization.  A blunt probe was used and I removed the bowel from the cul-  de-sac.  The patient was placed in Trendelenburg position.  At the time of  laparoscopy it was noted that the patient had two anterior fibroids.  This  could be the reason for the obstructed cervical canal.  Multiple pictures  were taken.  The left tube and ovary appeared normal.  The right tube and  ovary were visualized.  A small right  ovarian cyst was noted.  Once the  ureter was identified and insured to be out of the field of surgery, the  Gyrus was used to remove the right tube and ovary.  The right utero-ovarian  ligament was cauterized and cut without difficulty.  The Gyrus was then used  to cauterize and cut the along the right mesosalpinx.  Once the right tube  and ovary were free, they were placed in the anterior cul-de-sac.  Excellent  hemostasis was noted at the site.  The 10 mm scope was then removed from the  port and a 5 mm straight scope was inserted into the suprapubic port.  A  EndoCatch bag was placed through the 10 mm port and used to scope up the  right tube and ovary.  The adnexa was then removed without difficulty.  Again, under visualization, there were no areas of bleeding.  Excellent  hemostasis was noted.  All ports were removed from the abdomen and the gas  was released.  The fascia in the infraumbilical skin incision was closed  with 0 Vicryl.  The skin was closed with 3-0 Vicryl.  Marcaine 0.25%  was  used to provide local anesthesia at the point of skin incision.  The patient  uterine manipulator was removed from the cervix.  The patient tolerated the  procedure well.  The patient was extubated and taken to the recovery room in  stable condition.      Zelphia Cairo, MD  Electronically Signed     GA/MEDQ  D:  11/16/2005  T:  11/16/2005  Job:  [id number]

## 2011-02-16 NOTE — H&P (Signed)
NAME:  Lindsay Hodges, Lindsay Hodges NO.:  [id number]   MEDICAL RECORD NO.:  [id number]           PATIENT TYPE:   LOCATION:                                FACILITY:  [facility]   PHYSICIAN:  Zelphia Cairo, MD         DATE OF BIRTH:   DATE OF ADMISSION:  DATE OF DISCHARGE:                                HISTORY & PHYSICAL   HISTORY OF PRESENT ILLNESS:  This is a 55 year old white female who  initially presented for an annual exam in December of 2006, at which time an  ultrasound was performed which showed bilateral fluid collections within the  uterine cavity as well as a 5 x 10 mm complex cyst on the right adnexa.  The  patient was taken to the operating room in January for a hysteroscopy/D&C  and diagnostic laparoscopy with left ovarian cystectomy.  At the time of  surgery the uterine cavity was not able to be cannulized, most likely  secondary to a prior ablation and the hysteroscopy/D&C was not completed.  A  diagnostic laparoscopy was performed which confirmed a right complex ovarian  cyst and a laparoscopic right salpingo-oophorectomy was performed without  difficulty.   On follow-up in the office results and findings at the time of surgery were  discussed with the patient and we discussed about the possibility of  hysterectomy because of the inability to evaluate the endometrial cavity.  Our concern is that the fluid cavities within the endometrial cavity could  represent an abnormality which we are unable to evaluate secondary to her  stenosis and ablated cavity.  Of note at the timing of the diagnostic  laparoscopy two anterior uterine fibroids were noted which could also be the  reason for inability to perform hysteroscopy/D&C.  Risks, benefits, and  alternatives for this plan were discussed with the patient and informed  consent was obtained.   PAST MEDICAL HISTORY:  1.  Hypothyroidism, status post ablation.  2.  Menorrhagia, status post endometrial ablation.  3.  Jaw  surgery.  4.  Hematometra, status post drainage.   SOCIAL HISTORY:  Positive for tobacco use, negative for alcohol and drug  use.   ALLERGIES:  PENICILLIN.   MEDICATIONS:  Synthroid, Lipitor.   OBSTETRICAL HISTORY:  One spontaneous vaginal delivery at term, one preterm  vaginal delivery, and one stillborn infant at 43 weeks.   GYN HISTORY:  Significant for a history of abnormal Pap smears, status post  a LEEP procedure in the past.  Pap smears have been normal since.  Her last  Pap smear was performed in our office in February of 2007 and was  satisfactory and within normal limits.   PHYSICAL EXAMINATION:  HEART:  Regular rate and rhythm.  LUNGS:  Clear bilaterally.  ABDOMEN:  Soft, nontender, nondistended.  PELVIC:  Reveals normal external female genitalia.  Vagina, cervix appear  normal without lesions.  Uterus is mobile and nontender.   ASSESSMENT/PLAN:  Possible hematometra with inability to perform  hysteroscopy/D&C for drainage and evaluate the cavity.  Plan to perform a  laparoscopic assisted vaginal hysterectomy with  possible LSO.  Risks,  benefits, and alternatives were explained with the patient including the  possibility for laparotomy and informed consent was obtained.      Zelphia Cairo, MD  Electronically Signed     GA/MEDQ  D:  01/14/2006  T:  01/14/2006  Job:  [id number]

## 2011-02-16 NOTE — Op Note (Signed)
[facility]  Patient:    Lindsay Hodges, ACORD [facility]                      MRN: [id number] Proc. Date: 07/11/00 Adm. Date:  [id number] Disc. Date: [id number] Attending:  Leonie Man                           Operative Report  PREOPERATIVE DIAGNOSES:  Skeletal deformity, apertognathia, maxillary hyperplasia, mandibular hypoplasia.  POSTOPERATIVE DIAGNOSES:  Skeletal deformity, apertognathia, maxillary hyperplasia, mandibular hypoplasia.  OPERATIVE PROCEDURE:  LeFort I osteotomy impaction, bilateral sagittal split osteotomy, mandibular advancement.  SURGEON:  Simmie Davies, D.D.S., M.D.  ASSISTANT SURGEON:  Felton Clinton, M.D.  ANESTHESIA:  General endotracheal tube anesthesia.  BRIEF HISTORY:  This is a 55 year old female who was referred by her orthodontist for evaluation of orthognathic surgery.  On evaluation it was found that the patient had a skeletal deformity consisting of apertognathia, as well as mandibular hypoplasia.  She was occluding on only 2 posterior molars, decision was made to bring the patient for a LeFort I posterior impaction and advancement as well as bilateral sagittal split osteotomy mandibular advancement.  After appropriate consents had been reviewed with the patient the patient was brought to the operating room.  The patient was maintained n.p.o. the night before surgery and brought to the operating room and placed in the supine position.  All anesthesia monitors were found to be working appropriately.  All pressure points were appropriately padded.  The patient was nasotracheally intubated with minimal difficulty.  Once the tube was secured the patient was prepped and draped in a normal sterile fashion.  A throat pack was placed and the pre surgically fabricated acrylic splint was then wired to the patients maxillary dentition.  This is the final splint. Local anesthetic was then infiltrated into the maxillary vestibule  bilaterally as well as in the anterior and nasal floor.  Once adequate time for vasoconstriction was allowed a 15 bard Parker scalpel was used to make a mucosal incision from the posterior buttress area on one side to the contralateral side.  A 9 periosteal elevator was used to make a full thickness mucoperiosteal flap elevation exposing the anterior maxillae.  The piriform rim was identified and nasal floor and lateral walls were stripped of the nasal mucosa as well.  Once this was done and adequate exposure was made a reciprocating saw was used to make cuts from the pterygoid plate area to the anterior portion at the piriform rim above all tooth roots.  At this time reference bur holes were placed to allow for evaluation of the removal of bone.  Once this was accomplished a 3.5 mm wedge of bone was removed from the posterior maxillae tapering down to 0 mm in the anterior. The reciprocating saw was used to cut this portion of bone and remove it. This was done bilaterally.  The bone was placed in saline and saved later for a bone graft.  The nasal floor was reevaluated and the nasal mucosa was stripped.  A vomer osteotome was then used to disarticulate the nasal septum from the nasal floor.  The maxillae was then downfractured and mobilized. Once this was done interferences were removed from the posterior maxillae removing all sharp and bony areas on the lateral walls as well as at the posterior maxillae.  Once all interferences were removed the interpositional splint was placed on the acrylic splint  and the patient was placed into maxillomandibular fixation.  The patient was hinge articulated at the condyles superiorly, and ensured that no interferences were holding up good bony contact throughout both sides of the osteotomy cut.  Once this was established, bur holes were placed in the posterior maxillae at the buttress area.  Then 24 gauge wires were placed through these holes and held  in place with hemostats.  The maxillae was then evaluated for adequate impaction as well as advancement.  Once this was found to be in good relationship the patients mandible was held into appropriate position and the posterior wires and the buttress area were tightened down.  Two KLS L plates, one placed on either side of the piriform rim were then adapted to the advancement and new architecture at the piriform rim.  Four drill holes were drilled using copious amounts of normal saline irrigation and rigid fixation was accomplished at the anterior piriform rim.  ADDENDUM:  Prior to downfracture of the maxillae, the ptyergoid osteotomes were used to disarticulate the ptyergoid plates from the posterior maxillae and then the maxillae was down fractured.  Once this was done the maxillomandibular fixation was released.  The occlusion was checked and found to be passively articulating into the splint.  Local was injected bilaterally to the mandibular ramus area as well as the buccal cortex area after adequate time for vasoconstriction was allowed.  A 15 bard Jimmey Ralph was used to make a full thickness mucoperiosteal flap exposing the anterior ramus as well as buckle cortex.  The periosteum was elevated on the mesioaspect of the ramus exposing the inferior alveolar nerve which was identified and protected.  A reciprocating saw was then used under copious amounts of normal saline irrigation.  Superior to the inferior alveolar nerve was turned down to make the sagittal cut along the buccal side of the mandibular molars.  It was carried forward to approximately the distal of the first molar, the ______ cut was made in the same fashion with reciprocating saw under copious amounts of normal saline irrigation.  Small osteotomes were used to ensure adequate osteotomies on the superior sagittal and more inferior ______ cut osteotomies.  The [facility] elevator was then placed in to the buccal cortex and Smith  spreaders were placed into the superior aspect of the sagittal osteotomy cut.  The osteotomy was then separated and found to have a favorable fracture.  The nerve was identified and protected.  This area was  then packed with a normal saline soaked gauze and attention was focused on the contralateral side.  A similar process was then done in order to completely mobilize the mandible. Once the mandible was completely mobilized and both nerves had been identified the patient was wired into the final splint in maxillomandibular fixation with the 24 gauge wire.  Once this was done both sites were irrigated with copious normal saline and a stab incision was made on the right side of the patients cheek area for perpendicular access to the 2 mm KLS screws that were placed transcutaneously into the proximal and distal segments once the condyles had been seated into the condylar fossa.  Once three screws were placed on one side, a similar process was accomplished on the contralateral side and this was done transorally, and was also found to be in good bony contact and rigidly fixed.  Once this was done both thighs were irrigated with copious amounts of normal saline irrigation and the wounds were closed with 3-0 chromic suture  in a running fashion.  Once this was done the patient was released from fixation and the occlusion was checked.  There was a slight premature contact in the splint, however, this was deemed to be acceptable. The patient was cleaned, the throat pack was removed and the patient was allowed to wake from her general anesthesia.  Approximately 250 comfort care of blood was lost, none was administered.  No drains were placed and nothing was sent for pathology.  The patient will be maintained on a full liquid diet.  She will be kept at the day surgery center and it was anticipated that she would be discharged that afternoon which she was.  She will be kept at the day surgery  center and it was anticipated that she would be discharged that afternoon which she was. She will be followed in my office until complete healing of her osteotomy sites and will also be placed on p.o. antibiotics.  The patient was not wired shut in maxillomandibular fixation at the time of extubation, however, may be placed in elastics at a future date. DD:  07/11/00 TD:  07/12/00 Job: [id number]

## 2011-02-16 NOTE — H&P (Signed)
NAME:  Lindsay Hodges, Lindsay Hodges NO.:  [id number]   MEDICAL RECORD NO.:  [id number]          PATIENT TYPE:  AMB   LOCATION:                                FACILITY:  [facility]   PHYSICIAN:  Zelphia Cairo, MD    DATE OF BIRTH:  Aug 08, 1956   DATE OF ADMISSION:  11/16/2005  DATE OF DISCHARGE:                                HISTORY & PHYSICAL   A 55 year old white female who presented for annual exam on September 18, 2005. On review of her history, she noted that she had had a history of  hematometra that had been followed up by several D&C procedures for draining  her uterus. 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 She  also of note had a 5 x 10 mm complex cyst on the right adnexa. The left  adnexa was normal on the ultrasound and there was no fluid in the cul-de-  sac. The patient denies any symptoms of pelvic pain. She does note some  perimenopausal symptoms.   PAST MEDICAL HISTORY:  1.  Hypothyroidism status post ablation.  2.  Menorrhagia, status post endometrial ablation.  3.  Jaw surgery.  4.  Hematometra status post drainage.   SOCIAL HISTORY:  Positive for tobacco use.   ALLERGIES:  PENICILLIN.   MEDICATIONS:  Synthroid, Lipitor.   OBSTETRICAL HISTORY:  One spontaneous vaginal delivery, one pre-term vaginal  delivery and one stillborn infant at 25 weeks.   GYNECOLOGIC HISTORY:  Menarche at age 71. She has irregular periods and her  last was October of 2006. She does have a history of abnormal Pap smears and  is status post a LEEP procedure in the past. Pap smears since have been  normal.   PHYSICAL EXAMINATION:  VITAL SIGNS: Height 5 foot 4. Weight 143 pounds.  Blood pressure 110/70.  HEENT: Normal.  HEART: Regular rate and rhythm.  LUNGS: Clear bilaterally.  ABDOMEN: Soft, nontender, and nondistended.  BREASTS: Symmetrical without lesions or probable masses.  PELVIC: Normal external female genitalia. Vagina and cervix appear normal  without lesions. Uterus is small, mobile and nontender.  RECTAL: Negative for nodularity.   Transvaginal ultrasound was repeated in the office and follow-up of the  ovarian cyst and hematometra. 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 There was fluid seen within  the endometrium of both the right and left horns. There are also fibroids  measuring 2.5, 2.2, 1.2 and 1.1 cm. Right ovary continued to have a complex  cystic area measuring 13 x 6 x 12 mm. There was a calcification noted within  the cystic area representing a questionable dermoid. There was no free fluid  within the pelvis. Pap smear was performed which was negative and mammogram  was also benign.   ASSESSMENT/PLAN:  A 55 year old white female with hematometra and right  ovarian cyst. We discussed options to treat the findings on ultrasound and  the patient agreed to proceed with hysteroscopy and D&C to evaluate the  endometrial cavity  given the repeated hematometra despite having an  endometrial ablation in the past. We will also perform a laparoscopic right  salpingo-oophorectomy given the complex cyst on the right ovary which has  not resolved over a six-month period and is noted to have a calcification.  CA125 was checked and was found to be normal at 12.3.      Zelphia Cairo, MD  Electronically Signed     GA/MEDQ  D:  11/15/2005  T:  11/15/2005  Job:  [id number]

## 2011-02-16 NOTE — Op Note (Signed)
NAME:  Lindsay Hodges, BAHE NO.:  [id number]   MEDICAL RECORD NO.:  [id number]          PATIENT TYPE:  INP   LOCATION:  9304                          FACILITY:  [facility]   PHYSICIAN:  Zelphia Cairo, MD    DATE OF BIRTH:  [birth year]   DATE OF PROCEDURE:  01/15/2006  DATE OF DISCHARGE:                                 OPERATIVE REPORT   PREOPERATIVE DIAGNOSIS:  Possible hematometra.   POSTOPERATIVE DIAGNOSES:  1.  Possible hematometra.  2.  Uterine fibroids.   PROCEDURE:  Laparoscopic-assisted vaginal hysterectomy with left salpingo-  oophorectomy.   SURGEON:  Dr. Renaldo Fiddler   ASSISTANT:  Dr. Jennette Kettle.   ESTIMATED BLOOD LOSS:  150 mL.   COMPLICATIONS:  None.   SPECIMEN:  Uterus, cervix, left tube and ovary.   CONDITION:  Stable and extubated to recovery room.   INDICATIONS:  Lindsay Hodges is a 55 year old white female who presented for  her annual exam in December2006, at which time an ultrasound was performed.  Ultrasound revealed bilateral endometrial fluid collections and a 5 x 10 mm  complex cyst in the right adnexa.  An attempted hysteroscopy was performed  in January; however, due to prior ablation or uterine fibroids obstructing  the cavity, we were unable to evacuate the cavity in order to evaluate these  fluid collections.  On follow up examination in the office, we discussed  following with ultrasound versus hysterectomy, and the patient elected to  proceed with hysterectomy.  Risks, benefits, and alternatives were discussed  and consent was obtained.   PROCEDURE:  The patient was taken to the operating room where general  anesthesia was easily obtained.  She was placed in the dorsal lithotomy  position using Allen stirrups.  She was prepped and draped in sterile  fashion.  A red rubber catheter was used to empty the bladder.  A speculum  was placed in the vagina and a single tooth tenaculum was placed on the  anterior lip of the cervix.  Next, a uterine  manipulator was placed in the  uterus without difficulty and the speculum and tenaculum were removed.    Our attention was then turned to the abdomen.  A small infraumbilical skin  incision was made with a scalpel, and a Veress needle was inserted without  difficulty.  A syringe filled with saline was used to aspirate which  revealed clear air bubbles and intraperitoneal placement was confirmed with  a saline drop test.  CO2 gas was then attached to the Veress needle, and the  pelvis was insufflated.  The Veress needle was then removed and a blunt  trocar was used to enter the incision under direct visualization.  The site  directly beneath entry was observed, and no evidence of trauma was noted.  The small suprapubic skin incision was then made with the scalpel, and a 5  mm trocar was inserted under direct visualization.  The uterus was  manipulated up into the patient's right, and the left adnexa was grasped  with a blunt grasper to provide counter-tension.  The gyrus was then  inserted through the operative  port, and the infundibulopelvic ligament was  cauterized and cut.  This was extended down the mesosalpinx directly  adjacent to the fallopian tube and utero-ovarian ligament to the level of  the round ligament.  Excellent hemostasis was noted.  The gas and lights on  the laparoscopic equipment were then turned off, and our attention was then  turned to the vagina.    The uterine manipulator was removed.  A weighted speculum was placed in  the posterior vagina.  A Deaver was placed in the anterior vagina, and the  cervix was grasped with a double tooth tenaculum.  A circumferential  incision was made around the cervix using the Bovie.  The anterior cul-de-  sac was entered sharply using long curved Mayo scissors, and the Deaver  speculum was placed into the anterior cul-de-sac.  The uterosacral ligaments  were cauterized on both sides using the LigaSure, cut with curved Mayo   scissors.  At this point, the posterior cul-de-sac was entered sharply using  curved Mayo scissors.  The remaining uterosacral ligaments were cauterized  bilaterally using the LigaSure and cut using Mayo scissors.  Serial bites  with the LigaSure were taken up the cardinal ligament.  The uterus was then  delivered through the posterior cul-de-sac using a thyroid tenaculum.  The  remaining broad ligament attachments were grasped next to the uterus using a  curved Heaney clamp, cut with Mayo scissors, and suture ligated using  Vicryl.  This was done bilaterally.  Excellent hemostasis was noted, and the  specimen was handed off and sent to pathology.  Next, a wet lap sponge was  placed to help retract the bowel, and the posterior cuff of the vagina was  closed in a running locked fashion.  The moist lap was removed from the  vagina, and the anterior cuff was closed in a vertical fashion using figure-  of-eight sutures.  Hemostasis was noted.  All retractors were removed from  the vagina, and our attention was then returned to the abdomen.   The CO2 gas was used to reinsufflated the abdomen and pelvis, and the cuff  was visualized and noted to be free of any bleeders.  Suction irrigator was  used to irrigate the cuff and the pedicles to observe for any bleeding at  which time the field was noted to be hemostatic.  All trocars and ports were  removed from the abdomen, and the fascia of the infraumbilical skin incision  was closed with 0 Vicryl.  The skin was closed with 3-0 Vicryl and  approximated with Dermabond.  The patient tolerated the procedure well.  Sponge, lap, needle, and instrument counts were correct x2.  She did receive  antibiotics prior to skin incision and was taken to the recovery room  extubated and in stable condition.      Zelphia Cairo, MD  Electronically Signed     GA/MEDQ  D:  01/15/2006  T:  01/15/2006  Job:  [id number]

## 2011-05-07 ENCOUNTER — Ambulatory Visit (INDEPENDENT_AMBULATORY_CARE_PROVIDER_SITE_OTHER): Payer: BC Managed Care – PPO | Admitting: Family Medicine

## 2011-05-07 ENCOUNTER — Encounter: Payer: Self-pay | Admitting: Family Medicine

## 2011-05-07 VITALS — BP 137/78 | HR 85 | Temp 97.5°F | Ht 65.0 in | Wt 158.0 lb

## 2011-05-07 DIAGNOSIS — S99929A Unspecified injury of unspecified foot, initial encounter: Secondary | ICD-10-CM

## 2011-05-07 NOTE — Patient Instructions (Signed)
The treatment for a 3rd toe fracture that is not angulated or malrotated (yours isn't based on your clinical exam) is conservative and does not involve surgery. X-rays are not absolutely necessary for a probable 3rd toe fracture because it's unlikely to change the management which involves: Walking boot or hard soled shoe to help with walking up to 6 weeks. Icing 15 minutes at a time 3-4 times a day. Elevation to help with swelling. Aleve or tylenol as needed for pain.  Start the exercises and stretches for your left heel. If this doesn't continue to improve over the next 4 weeks as expected, return to see me and there are some other things we can discuss.

## 2011-05-08 ENCOUNTER — Encounter: Payer: Self-pay | Admitting: Family Medicine

## 2011-05-08 DIAGNOSIS — S99929A Unspecified injury of unspecified foot, initial encounter: Secondary | ICD-10-CM | POA: Insufficient documentation

## 2011-05-08 NOTE — Progress Notes (Signed)
Subjective:    Patient ID: Lindsay Hodges, female    DOB: 1955/10/04, 55 y.o.   MRN: [id number]  PCP: Thereasa Solo [place]  HPI 55 yo F here for right 3rd toe injury.  Patient reports 6 days ago on 7/31 she accidentally kicked wall with her right foot causing injury to 3rd toe. + swelling and bruising. Difficulty walking following this. Has been taking aleve, elevating. Not icing. Pain has improved since then but still severe under toenail. Has buddy taped to 2nd toe for support.  Past Medical History  Diagnosis Date  . Hematometra   . Excessive or frequent menstruation   . Unspecified hypothyroidism   . Carotid bruit   . Edema   . Hyperlipidemia   . Hypertension     Current Outpatient Prescriptions on File Prior to Visit  Medication Sig Dispense Refill  . aspirin 81 MG tablet Take 81 mg by mouth daily.        Marland Kitchen atorvastatin (LIPITOR) 10 MG tablet Take 10 mg by mouth daily.        Marland Kitchen estradiol (ESTRACE) 0.5 MG tablet Take 1 mg by mouth daily.       Marland Kitchen levothyroxine (SYNTHROID, LEVOTHROID) 75 MCG tablet Take 75 mcg by mouth daily. Except for Saturday take 50 mcg.       . losartan (COZAAR) 50 MG tablet Take 50 mg by mouth daily.          Past Surgical History  Procedure Date  . Mouth surgery     jaw surgery    Allergies  Allergen Reactions  . Penicillins     REACTION: swelling/dyspnea    History   Social History  . Marital Status: Married    Spouse Name: N/A    Number of Children: N/A  . Years of Education: N/A   Occupational History  .      [employer]   Social History Main Topics  . Smoking status: Former Games developer  . Smokeless tobacco: Not on file  . Alcohol Use: No  . Drug Use: No  . Sexually Active: Not on file   Other Topics Concern  . Not on file   Social History Narrative  . No narrative on file    Family History  Problem Relation Age of Onset  . Hypertension Mother   . Diabetes Neg Hx   . Heart attack Neg Hx   .  Hyperlipidemia Neg Hx   . Sudden death Neg Hx     BP 137/78  Pulse 85  Temp(Src) 97.5 F (36.4 C) (Oral)  Ht 5\' 5"  (1.651 m)  Wt 158 lb (71.668 kg)  BMI 26.29 kg/m2  Review of Systems See HPI above.    Objective:   Physical Exam Gen: NAD  R foot: Swelling, bruising 3rd toe throughout.  No redness, breaks in skin, subungual hematoma.  No angulation or malrotation TTP middle phalanx distally, greatest over distal phalanx 3rd digit.  No other TTP about foot or digits. FROM ankle.  Difficulty moving 3rd toe 2/2 pain. Strength 5/5 all ankle motions. Cap refill < 2 sec 3rd digit.  2+ dp pulse.     Assessment & Plan:  1. Probable R 3rd toe fracture - discussed with patient that clinically with this being non-angulated and not malrotated, treatment will not change even if we confirm fracture by x-rays.  The only thing that will change is time to improvement - if bruised or sprained should improve over next couple weeks.  If fractured, can take up to 6 weeks.  Buddy tape, cam walker (has one at home already), icing, elevation, tylenol/nsaids as needed for pain.  Discussed a possible flexor/extensor tendon rupture of fingers or great toe would necessitate either splinting or possible surgery but this is generally not necessary for lesser toes due to decreased necessary function of these digits (though may lead to deformity).  Will continue to monitor.  See instructions for further.

## 2011-05-08 NOTE — Assessment & Plan Note (Signed)
Probable R 3rd toe fracture - discussed with patient that clinically with this being non-angulated and not malrotated, treatment will not change even if we confirm fracture by x-rays.  The only thing that will change is time to improvement - if bruised or sprained should improve over next couple weeks.  If fractured, can take up to 6 weeks.  Buddy tape, cam walker (has one at home already), icing, elevation, tylenol/nsaids as needed for pain.  Discussed a possible flexor/extensor tendon rupture of fingers or great toe would necessitate either splinting or possible surgery but this is generally not necessary for lesser toes due to decreased necessary function of these digits (though may lead to deformity).  Will continue to monitor.  See instructions for further.  I did offer x-rays to patient after discussion about not changing management and she declined.

## 2011-09-08 ENCOUNTER — Other Ambulatory Visit: Payer: Self-pay | Admitting: Cardiovascular Disease

## 2011-10-04 ENCOUNTER — Telehealth: Payer: Self-pay | Admitting: Cardiovascular Disease

## 2011-10-04 NOTE — Telephone Encounter (Signed)
New msg Pt said she has been having pain in the upper left part of the chest for about a week. The pain comes and goes. No sob. Please call her back

## 2011-10-04 NOTE — Telephone Encounter (Signed)
SPOKE WITH PT RE MESSAGE . PT C/O CHEST PAIN ALWAYS THERE, NO WORSE WITH MOVEMENT  NO SOB OR NAUSEA   ALSO C/O LEFT ARM HEAVINESS  NEW S/S. PT NOT SURE OF FAMILY HISTORY ON FATHER'S SIDE APPT  MADE WITH LORI GERHARDT NP FOR 10-09-11 AT 8:15  AM . INSTRUCTED PT IF S/S WORSEN TO GO TO ER FOR EVALUATION AND TREATMENT .VERBALIZED UNDERSTANDING .Zack Seal

## 2011-10-09 ENCOUNTER — Encounter: Payer: Self-pay | Admitting: Nurse Practitioner

## 2011-10-09 ENCOUNTER — Telehealth: Payer: Self-pay | Admitting: Cardiovascular Disease

## 2011-10-09 ENCOUNTER — Ambulatory Visit (INDEPENDENT_AMBULATORY_CARE_PROVIDER_SITE_OTHER): Payer: BC Managed Care – PPO | Admitting: Nurse Practitioner

## 2011-10-09 DIAGNOSIS — E782 Mixed hyperlipidemia: Secondary | ICD-10-CM

## 2011-10-09 DIAGNOSIS — R079 Chest pain, unspecified: Secondary | ICD-10-CM

## 2011-10-09 LAB — CBC WITH DIFFERENTIAL/PLATELET
Basophils Absolute: 0 10*3/uL (ref 0.0–0.1)
Basophils Relative: 0.5 % (ref 0.0–3.0)
Eosinophils Absolute: 0.2 10*3/uL (ref 0.0–0.7)
Eosinophils Relative: 3.2 % (ref 0.0–5.0)
HCT: 37.4 % (ref 36.0–46.0)
Hemoglobin: 12.5 g/dL (ref 12.0–15.0)
Lymphocytes Relative: 24.8 % (ref 12.0–46.0)
Lymphs Abs: 1.5 10*3/uL (ref 0.7–4.0)
MCHC: 33.3 g/dL (ref 30.0–36.0)
MCV: 91.3 fl (ref 78.0–100.0)
Monocytes Absolute: 0.6 10*3/uL (ref 0.1–1.0)
Monocytes Relative: 9.5 % (ref 3.0–12.0)
Neutro Abs: 3.8 10*3/uL (ref 1.4–7.7)
Neutrophils Relative %: 62 % (ref 43.0–77.0)
Platelets: 209 10*3/uL (ref 150.0–400.0)
RBC: 4.1 Mil/uL (ref 3.87–5.11)
RDW: 14.3 % (ref 11.5–14.6)
WBC: 6.1 10*3/uL (ref 4.5–10.5)

## 2011-10-09 LAB — BASIC METABOLIC PANEL
BUN: 15 mg/dL (ref 6–23)
CO2: 30 mEq/L (ref 19–32)
Calcium: 9.1 mg/dL (ref 8.4–10.5)
Chloride: 106 mEq/L (ref 96–112)
Creatinine, Ser: 0.7 mg/dL (ref 0.4–1.2)
GFR: 94.53 mL/min (ref >60.00)
Glucose, Bld: 84 mg/dL (ref 70–99)
Potassium: 4.8 mEq/L (ref 3.5–5.1)
Sodium: 141 mEq/L (ref 135–145)

## 2011-10-09 LAB — TSH: TSH: 2.35 u[IU]/mL (ref 0.35–5.50)

## 2011-10-09 LAB — TROPONIN I: Troponin I: <0.3 ng/mL (ref <0.30)

## 2011-10-09 NOTE — Telephone Encounter (Signed)
LAB REPORTED TROPONIN TO BE LESS THAN 0.30 ./CY

## 2011-10-09 NOTE — Telephone Encounter (Signed)
Fu call °Patient returning your call °

## 2011-10-09 NOTE — Patient Instructions (Signed)
We are going to check some labs today and arrange for a stress test.  Stay on your current medicines.  Call the [facility] office at [phone] if you have any questions, problems or concerns.

## 2011-10-09 NOTE — Telephone Encounter (Signed)
New Msg: [facility] calling to report lab work. Please return call to discuss further.

## 2011-10-09 NOTE — Assessment & Plan Note (Signed)
Presents with chest pain. Has been going on since Christmas. Sounds atypical but has multiple risk factors for CV disease. She has known carotid disease, hyperlipidemia, and HTN. No family history of CAD. Did smoke in the remote past. We will check some labs today and will update her stress test this week. For now, I have left her on her current medical regimen. Patient is agreeable to this plan and will call if any problems develop in the interim.

## 2011-10-09 NOTE — Telephone Encounter (Signed)
PT AWARE OF LAB RESULTS./CY 

## 2011-10-09 NOTE — Progress Notes (Signed)
   Lindsay Hodges Date of Birth: [birth year] Medical Record #[id number]  History of Present Illness: Lindsay Hodges is seen today for a work in visit. She is seen for Dr. Eden Emms. She notes that she had some extensive dental surgery right before Christmas. She was not compliant with her post op antibiotics. About a week later, she began to have this dull feeling in the midsternal chest region. It would just come and go. Never really lasted long. Was not exertional in nature. Over the past week or so has also noted that her left arm felt a little heavier. This also comes and goes. There is nothing she can do to bring the symptoms on or relieve them. She has no other associated symptoms. She has felt more fatigued. Was walking 3 to 5 miles but has really slacked off due to no energy. Last stress test was in July of 2010. She has multiple CV risk factors.   Current Outpatient Prescriptions on File Prior to Visit  Medication Sig Dispense Refill  . aspirin 81 MG tablet Take 81 mg by mouth daily.        Marland Kitchen atorvastatin (LIPITOR) 10 MG tablet Take 10 mg by mouth daily.        Marland Kitchen levothyroxine (SYNTHROID, LEVOTHROID) 75 MCG tablet Take 75 mcg by mouth daily. Except for Saturday take 50 mcg.       . losartan (COZAAR) 50 MG tablet TAKE 1 TABLET EVERY DAY  90 tablet  3    Allergies  Allergen Reactions  . Penicillins     REACTION: swelling/dyspnea    Past Medical History  Diagnosis Date  . Hematometra   . Excessive or frequent menstruation   . Unspecified hypothyroidism   . Carotid bruit   . Edema   . Hyperlipidemia   . Hypertension     Past Surgical History  Procedure Date  . Mouth surgery     jaw surgery    History  Smoking status  . Former Smoker  Smokeless tobacco  . Not on file    History  Alcohol Use No    Family History  Problem Relation Age of Onset  . Hypertension Mother   . Diabetes Neg Hx   . Heart attack Neg Hx   . Hyperlipidemia Neg Hx   . Sudden death Neg Hx      Review of Systems: The review of systems is per the HPI.  Was a little dizzy yesterday that is unusual for her. All other systems were reviewed and are negative.  Physical Exam: Ht 5\' 5"  (1.651 m)  Wt 165 lb 9.6 oz (75.116 kg)  BMI 27.56 kg/m2 Patient is very pleasant and in no acute distress. She is overweight. Skin is warm and dry. Color is normal.  HEENT is unremarkable. Normocephalic/atraumatic. PERRL. Sclera are nonicteric. Neck is supple. No masses. No JVD. Lungs are clear. Cardiac exam shows a regular rate and rhythm. Abdomen is soft. Extremities are without edema. Gait and ROM are intact. No gross neurologic deficits noted.  LABORATORY DATA: EKG today shows sinus rhythm and is normal.    Assessment / Plan:

## 2011-10-09 NOTE — Assessment & Plan Note (Signed)
She reports that her cholesterol levels remain at goal. Labs are checked by her PCP.

## 2011-10-15 ENCOUNTER — Ambulatory Visit (HOSPITAL_COMMUNITY): Payer: BC Managed Care – PPO | Attending: Nurse Practitioner | Admitting: Radiology

## 2011-10-15 DIAGNOSIS — E785 Hyperlipidemia, unspecified: Secondary | ICD-10-CM | POA: Insufficient documentation

## 2011-10-15 DIAGNOSIS — I10 Essential (primary) hypertension: Secondary | ICD-10-CM | POA: Insufficient documentation

## 2011-10-15 DIAGNOSIS — R079 Chest pain, unspecified: Secondary | ICD-10-CM | POA: Insufficient documentation

## 2011-10-15 DIAGNOSIS — R5383 Other fatigue: Secondary | ICD-10-CM | POA: Insufficient documentation

## 2011-10-15 DIAGNOSIS — R42 Dizziness and giddiness: Secondary | ICD-10-CM | POA: Insufficient documentation

## 2011-10-15 DIAGNOSIS — R5381 Other malaise: Secondary | ICD-10-CM | POA: Insufficient documentation

## 2011-10-15 DIAGNOSIS — Z87891 Personal history of nicotine dependence: Secondary | ICD-10-CM | POA: Insufficient documentation

## 2011-10-15 DIAGNOSIS — I779 Disorder of arteries and arterioles, unspecified: Secondary | ICD-10-CM | POA: Insufficient documentation

## 2011-10-15 DIAGNOSIS — R002 Palpitations: Secondary | ICD-10-CM | POA: Insufficient documentation

## 2011-10-15 MED ORDER — TECHNETIUM TC 99M TETROFOSMIN IV KIT
30.0000 | PACK | Freq: Once | INTRAVENOUS | Status: AC | PRN
  Administered 2011-10-15: 30 via INTRAVENOUS

## 2011-10-15 MED ORDER — TECHNETIUM TC 99M TETROFOSMIN IV KIT
10.0000 | PACK | Freq: Once | INTRAVENOUS | Status: AC | PRN
  Administered 2011-10-15: 10 via INTRAVENOUS

## 2011-10-15 NOTE — Progress Notes (Signed)
[facility] SITE 3 NUCLEAR MED [address] [place] [address] [phone]  Cardiology Nuclear Med Study  Lindsay Hodges is a 56 y.o. female [id number] January 18, 1956   Nuclear Med Background Indication for Stress Test:  Evaluation for Ischemia History:  '04 Echo: EF=55-65%, 7/10 Myocardial Perfusion Study:Normal, EF=62% Cardiac Risk Factors: Carotid Disease, History of Smoking, Hypertension and Lipids  Symptoms: Chest pain without exertion ( last occurrence was yesterday),  Dizziness, Fatigue and Palpitations   Nuclear Pre-Procedure Caffeine/Decaff Intake:  None NPO After: 5:00pm   Lungs:  Clear. IV 0.9% NS with Angio Cath:  20g  IV Site: R Antecubital x 1, tolerated well IV Started by:  Irean Hong, RN  Chest Size (in):  36 Cup Size: D  Height: 5\' 5"  (1.651 m)  Weight:  165 lb (74.844 kg)  BMI:  Body mass index is 27.46 kg/(m^2). Tech Comments:  n/a    Nuclear Med Study 1 or 2 day study: 1 day  Stress Test Type:  Stress  Reading MD: Dietrich Pates, MD  Order Authorizing Provider:  Charlton Haws, MD, Norma Fredrickson, NP  Resting Radionuclide: Technetium 22m Tetrofosmin  Resting Radionuclide Dose: 11.0 mCi   Stress Radionuclide:  Technetium 26m Tetrofosmin  Stress Radionuclide Dose: 33.0 mCi           Stress Protocol Rest HR: 61 Stress HR: 166  Rest BP: Sitting 169/74   Standing 177/71 Stress BP: 213/59  Exercise Time (min): 4:00 METS: 5.2   Predicted Max HR: 163 bpm % Max HR: 101.84 bpm Rate Pressure Product: 29518   Dose of Adenosine (mg):  n/a Dose of Lexiscan: n/a mg  Dose of Atropine (mg): n/a Dose of Dobutamine: n/a mcg/kg/min (at max HR)  Stress Test Technologist: Smiley Houseman, CMA-N  Nuclear Technologist:  Doyne Keel, CNMT     Rest Procedure:  Myocardial perfusion imaging was performed at rest 45 minutes following the intravenous administration of Technetium 53m Tetrofosmin.  Rest ECG: No acute changes.  Stress Procedure:  The patient  exercised for four minutes on the treadmill utilizing the Bruce protocol.  The patient stopped due to fatigue.  She denied any chest pain, but did c/o nausea.  There were marked ST-T wave changes with a hypertensive response, 213/59.  Technetium 66m Tetrofosmin was injected at peak exercise and myocardial perfusion imaging was performed after a brief delay.  Stress EKG's and images were reviewed with  Dr. Patty Sermons, DOD, and it is OK to discharge patient.   Stress ECG: 2 mm ST depression in III, AVF, V4 to V6 at near peak exercise.  Near normalized by 1 minute of recovery.  QPS Raw Data Images:  Soft tissue (diaphragm, breast) surround heart. Stress Images:  Normal homogeneous uptake in all areas of the myocardium. Rest Images:  Normal homogeneous uptake in all areas of the myocardium. Subtraction (SDS):  No evidence of ischemia. Transient Ischemic Dilatation (Normal <1.22):  1.04 Lung/Heart Ratio (Normal <0.45):  0.27  Quantitative Gated Spect Images QGS EDV:  82 ml QGS ESV:  35 ml QGS cine images:  NL LV Function; NL Wall Motion QGS EF: 57%  Impression Exercise Capacity:  Poor exercise capacity. BP Response:  Normal blood pressure response. Clinical Symptoms:  No chest pain. ECG Impression:  Significant ST abnormalities consistent with ischemia. Comparison with Prior Nuclear Study: No change in myoview.  EKG changes were also present on previous scan as well.  Overall Impression:  Electrically positive for ischemia (present on previous stress test)  Myoview with normal perfusion. Dietrich Pates

## 2011-10-16 ENCOUNTER — Other Ambulatory Visit: Payer: Self-pay | Admitting: *Deleted

## 2011-10-16 ENCOUNTER — Telehealth: Payer: Self-pay | Admitting: *Deleted

## 2011-10-16 DIAGNOSIS — I6529 Occlusion and stenosis of unspecified carotid artery: Secondary | ICD-10-CM

## 2011-10-16 NOTE — Telephone Encounter (Signed)
CALLED PT  RE MYOVIEW RESULTS ,WHILE  REVIEWING CHART  NOTED PT WAS OVERDUE FOR CAROTID DUPLEX TRANSFERRED  PT TO SCHEDULERS TO SET  UP CAROTID .Zack Seal

## 2011-11-02 ENCOUNTER — Encounter (INDEPENDENT_AMBULATORY_CARE_PROVIDER_SITE_OTHER): Payer: BC Managed Care – PPO | Admitting: *Deleted

## 2011-11-02 DIAGNOSIS — I6529 Occlusion and stenosis of unspecified carotid artery: Secondary | ICD-10-CM

## 2011-12-26 ENCOUNTER — Ambulatory Visit: Payer: BC Managed Care – PPO | Admitting: Internal Medicine

## 2012-01-30 ENCOUNTER — Ambulatory Visit (INDEPENDENT_AMBULATORY_CARE_PROVIDER_SITE_OTHER): Payer: BC Managed Care – PPO | Admitting: Internal Medicine

## 2012-01-30 ENCOUNTER — Encounter: Payer: Self-pay | Admitting: Internal Medicine

## 2012-01-30 ENCOUNTER — Telehealth: Payer: Self-pay | Admitting: Cardiovascular Disease

## 2012-01-30 VITALS — BP 128/70 | HR 85 | Temp 97.0°F | Resp 17 | Ht 65.0 in | Wt 166.0 lb

## 2012-01-30 DIAGNOSIS — M1990 Unspecified osteoarthritis, unspecified site: Secondary | ICD-10-CM | POA: Insufficient documentation

## 2012-01-30 DIAGNOSIS — Z139 Encounter for screening, unspecified: Secondary | ICD-10-CM

## 2012-01-30 DIAGNOSIS — E039 Hypothyroidism, unspecified: Secondary | ICD-10-CM

## 2012-01-30 DIAGNOSIS — N951 Menopausal and female climacteric states: Secondary | ICD-10-CM

## 2012-01-30 DIAGNOSIS — Z1231 Encounter for screening mammogram for malignant neoplasm of breast: Secondary | ICD-10-CM

## 2012-01-30 DIAGNOSIS — E0500 Thyrotoxicosis with diffuse goiter without thyrotoxic crisis or storm: Secondary | ICD-10-CM | POA: Insufficient documentation

## 2012-01-30 DIAGNOSIS — I10 Essential (primary) hypertension: Secondary | ICD-10-CM

## 2012-01-30 DIAGNOSIS — E785 Hyperlipidemia, unspecified: Secondary | ICD-10-CM | POA: Insufficient documentation

## 2012-01-30 LAB — CBC WITH DIFFERENTIAL/PLATELET
Basophils Relative: 1 % (ref 0–1)
Eosinophils Absolute: 0.3 10*3/uL (ref 0.0–0.7)
Eosinophils Relative: 3 % (ref 0–5)
HCT: 41.1 % (ref 36.0–46.0)
Hemoglobin: 13.1 g/dL (ref 12.0–15.0)
Lymphs Abs: 2 10*3/uL (ref 0.7–4.0)
MCH: 28.9 pg (ref 26.0–34.0)
MCHC: 31.9 g/dL (ref 30.0–36.0)
MCV: 90.7 fL (ref 78.0–100.0)
Monocytes Absolute: 0.8 10*3/uL (ref 0.1–1.0)
Monocytes Relative: 10 % (ref 3–12)
Neutrophils Relative %: 60 % (ref 43–77)
RBC: 4.53 MIL/uL (ref 3.87–5.11)

## 2012-01-30 NOTE — Patient Instructions (Signed)
Schedule CPe with me  Labs will be maileed to you

## 2012-01-30 NOTE — Progress Notes (Signed)
Subjective:    Patient ID: Lindsay Hodges, female    DOB: [birth year], 56 y.o.   MRN: [id number]  HPI New pt here for first visit.  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  Former primary care Dr. Marisue [place] and [place].  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  Shakeera also gives history of remote anemia with hgb of around 6 backin 2005.  Extensive workup with GI and rheumatology consultation revealed no etiology that she is aware.     She is S/P Hysterectomy  Sayana is having symptomatic menopausal symptoms.  She has night vasomotor flushes that wake her up.  She reports not too many during the day.  She was on Estrace in the past by her GYn for around 2 years.  Denies vaginal dryness or dyspareunia.    Sister has breast cancer dx age 38, CVA , HTN, and Afib in mother, no other GYN cancers.  No personal or FH of DVT or PE.  No colon CA  Allergies  Allergen Reactions  . Penicillins     REACTION: swelling/dyspnea   Past Medical History  Diagnosis Date  . Excessive or frequent menstruation   . Carotid disease, bilateral   . Edema   . Elevated sed rate     remote history of myelosuppressive disorder  . Hyperlipidemia   . Hypertension   . Grave's disease   . Arthritis    Past Surgical History  Procedure Date  . Mouth surgery     jaw surgery   History   Social History  . Marital Status: Married    Spouse Name: N/A    Number of Children: N/A  . Years of Education: N/A   Occupational History  .      [employer]   Social History Main Topics  . Smoking status: Former Smoker -- 0.5 packs/day for 12 years  . Smokeless tobacco: Not on file  . Alcohol Use: No  . Drug Use: No  . Sexually Active: Not on file   Other Topics Concern  . Not on file   Social History Narrative  . No narrative on file   Family History  Problem Relation Age of Onset  . Hypertension Mother   . Diabetes Neg Hx     . Heart attack Neg Hx   . Hyperlipidemia Neg Hx   . Sudden death Neg Hx    Patient Active Problem List  Diagnoses  . HYPOTHYROIDISM  . Mixed hyperlipidemia  . ESSENTIAL HYPERTENSION, BENIGN  . HEMATOMETRA  . MENORRHAGIA  . LEG EDEMA  . CAROTID BRUIT, LEFT  . CHEST PAIN UNSPECIFIED  . Toe injury  . Hyperlipidemia  . Hypertension  . Grave's disease  . Arthritis  . Carotid disease, bilateral   Current Outpatient Prescriptions on File Prior to Visit  Medication Sig Dispense Refill  . aspirin 81 MG tablet Take 81 mg by mouth daily.        Marland Kitchen atorvastatin (LIPITOR) 10 MG tablet Take 10 mg by mouth daily.        Marland Kitchen levothyroxine (SYNTHROID, LEVOTHROID) 75 MCG tablet Take 75 mcg by mouth daily. Except for Saturday take 50 mcg.       . losartan (COZAAR) 50 MG tablet TAKE 1 TABLET EVERY DAY  90 tablet  3       Review of Systems See HPI    Objective:   Physical Exam Physical Exam  Nursing note  and vitals reviewed.  Constitutional: She is oriented to person, place, and time. She appears well-developed and well-nourished.  HENT:  Head: Normocephalic and atraumatic.  Cardiovascular: Normal rate and regular rhythm. Exam reveals no gallop and no friction rub.  No murmur heard.  Pulmonary/Chest: Breath sounds normal. She has no wheezes. She has no rales.  Neurological: She is alert and oriented to person, place, and time.  Skin: Skin is warm and dry.  Psychiatric: She has a normal mood and affect. Her behavior is normal.  Ext no edema     Assessment & Plan:  1)  Vasomotor flushes.  Extensively reviewed risk/benefit of HT.  Pt  Given risk/benefit sheet to review at home.  Counseled to have sister or pt to get BRCA testing.  Her risk is increased with pre-menopausal breast cancer in her sister.  Pt wishes to think about this.  I counseled my first choice is non-hormone options for her.  Will get UTD mammogram and Cpe with pelvic next visit.  2)  Htn  Well controlled 3)   Hyperlipidemia  Check today 40 Hypothyroidis  Check today  Schedule CPE

## 2012-01-30 NOTE — Telephone Encounter (Signed)
New Problem:     I called the patient and was unable to reach them. I left a message on their voicemail with my name, the reason I called, the name of his physician, and a number to call back to schedule their appointment. 

## 2012-01-31 ENCOUNTER — Telehealth: Payer: Self-pay | Admitting: Internal Medicine

## 2012-01-31 LAB — COMPLETE METABOLIC PANEL WITH GFR
ALT: 13 U/L (ref 0–35)
Alkaline Phosphatase: 88 U/L (ref 39–117)
CO2: 26 mEq/L (ref 19–32)
Creat: 0.62 mg/dL (ref 0.50–1.10)
GFR, Est African American: >89 mL/min
Sodium: 138 mEq/L (ref 135–145)
Total Bilirubin: 0.3 mg/dL (ref 0.3–1.2)
Total Protein: 7.1 g/dL (ref 6.0–8.3)

## 2012-01-31 LAB — VITAMIN D 25 HYDROXY (VIT D DEFICIENCY, FRACTURES): Vit D, 25-Hydroxy: 16 ng/mL — ABNORMAL LOW (ref 30–89)

## 2012-01-31 LAB — TSH: TSH: 2.261 u[IU]/mL (ref 0.350–4.500)

## 2012-01-31 LAB — LIPID PANEL: Total CHOL/HDL Ratio: 2.9 Ratio

## 2012-01-31 MED ORDER — VITAMIN D3 1.25 MG (50000 UT) PO CAPS: 1.0000 | ORAL_CAPSULE | ORAL | Status: DC

## 2012-01-31 NOTE — Telephone Encounter (Signed)
Call pt and let her know her Vitamin D is very low and she needs to take a prescription strength 50,000 units once a week for four weeks then 1000 units fdaily  Will send e-script to her pharmacy

## 2012-02-06 ENCOUNTER — Other Ambulatory Visit (INDEPENDENT_AMBULATORY_CARE_PROVIDER_SITE_OTHER): Payer: BC Managed Care – PPO

## 2012-02-06 DIAGNOSIS — R319 Hematuria, unspecified: Secondary | ICD-10-CM

## 2012-02-06 DIAGNOSIS — R300 Dysuria: Secondary | ICD-10-CM

## 2012-02-06 LAB — POCT URINALYSIS DIPSTICK
Glucose, UA: NEGATIVE
Nitrite, UA: NEGATIVE
Spec Grav, UA: 1.025
Urobilinogen, UA: 0.2

## 2012-02-06 MED ORDER — CIPROFLOXACIN HCL 500 MG PO TABS: 500.0000 mg | ORAL_TABLET | Freq: Two times a day (BID) | ORAL | Status: AC

## 2012-02-06 NOTE — Progress Notes (Signed)
C/o hematuria, pressure, pain with urination. Uses [facility]

## 2012-02-07 ENCOUNTER — Encounter: Payer: Self-pay | Admitting: Family Medicine

## 2012-02-07 ENCOUNTER — Ambulatory Visit (INDEPENDENT_AMBULATORY_CARE_PROVIDER_SITE_OTHER): Payer: BC Managed Care – PPO | Admitting: Family Medicine

## 2012-02-07 ENCOUNTER — Other Ambulatory Visit: Payer: Self-pay | Admitting: Internal Medicine

## 2012-02-07 VITALS — BP 125/75 | HR 69 | Temp 98.1°F | Ht 65.0 in | Wt 163.0 lb

## 2012-02-07 DIAGNOSIS — M25559 Pain in unspecified hip: Secondary | ICD-10-CM

## 2012-02-07 DIAGNOSIS — M25552 Pain in left hip: Secondary | ICD-10-CM

## 2012-02-07 MED ORDER — MELOXICAM 15 MG PO TABS: 15.0000 mg | ORAL_TABLET | Freq: Every day | ORAL | Status: DC

## 2012-02-07 NOTE — Assessment & Plan Note (Signed)
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  Shown home stretches, handout provided.  Hip abduction strengthening exercise shown as well.  Icing, tylenol, meloxicam for pain.  Discussed formal PT with iontophoresis, cortisone injection as possibilities - would like to start with conservative treatment first.  F/u in 1 month.

## 2012-02-07 NOTE — Patient Instructions (Signed)
You have trochanteric bursitis which is related to IT band syndrome Avoid painful activities as much as possible (lying on this side) except when doing home exercises and stretches. Ice over area of pain 3-4 times a day for 15 minutes at a time Hip abduction exercise 3 x 15 once a day - add weights if this becomes too easy. Stretches - pick 2 and hold for 20-30 seconds x 3 - do once or twice a day. Meloxicam 15mg  daily with food for pain and inflammation. If not improving, can consider formal physical therapy and/or steroid injection. Follow up with me in 1 month.

## 2012-02-07 NOTE — Progress Notes (Signed)
Subjective:    Patient ID: Lindsay Hodges, female    DOB: 26-May-1956, 56 y.o.   MRN: [id number]  PCP: Dr Constance Goltz  HPI 56 yo F here for left hip pain.  Patient denies known injury. She states several years ago she had similar pain in left hip that improved with a cortisone injection. Reports over past several months has had worsening lateral left hip pain. Worse when crossing legs and lying on left side. No back pain. No groin pain. No numbness or tingling. No radiation into left leg. Has not tried PT. Has taken occasional aleve.  Past Medical History  Diagnosis Date  . Excessive or frequent menstruation   . Carotid disease, bilateral   . Edema   . Elevated sed rate     remote history of myelosuppressive disorder  . Hyperlipidemia   . Hypertension   . Grave's disease   . Arthritis     Current Outpatient Prescriptions on File Prior to Visit  Medication Sig Dispense Refill  . aspirin 81 MG tablet Take 81 mg by mouth daily.        Marland Kitchen atorvastatin (LIPITOR) 10 MG tablet Take 10 mg by mouth daily.        . Cholecalciferol (VITAMIN D3) 50000 UNITS CAPS Take 1 capsule by mouth once a week.  4 capsule  0  . ciprofloxacin (CIPRO) 500 MG tablet Take 1 tablet (500 mg total) by mouth 2 (two) times daily.  10 tablet  0  . levothyroxine (SYNTHROID, LEVOTHROID) 75 MCG tablet Take 75 mcg by mouth daily. Except for Saturday take 50 mcg.       . losartan (COZAAR) 50 MG tablet TAKE 1 TABLET EVERY DAY  90 tablet  3    Past Surgical History  Procedure Date  . Mouth surgery     jaw surgery    Allergies  Allergen Reactions  . Penicillins     REACTION: swelling/dyspnea    History   Social History  . Marital Status: Married    Spouse Name: N/A    Number of Children: N/A  . Years of Education: N/A   Occupational History  .      [employer]   Social History Main Topics  . Smoking status: Former Smoker -- 0.5 packs/day for 12 years  . Smokeless tobacco: Not on file   . Alcohol Use: No  . Drug Use: No  . Sexually Active: Not on file   Other Topics Concern  . Not on file   Social History Narrative  . No narrative on file    Family History  Problem Relation Age of Onset  . Hypertension Mother   . Diabetes Neg Hx   . Heart attack Neg Hx   . Hyperlipidemia Neg Hx   . Sudden death Neg Hx     BP 125/75  Pulse 69  Temp(Src) 98.1 F (36.7 C) (Oral)  Ht 5\' 5"  (1.651 m)  Wt 163 lb (73.936 kg)  BMI 27.12 kg/m2  Review of Systems See HPI above.    Objective:   Physical Exam Gen: NAD  Back/L hip: No gross deformity, scoliosis. No paraspinal TTP.  No midline or bony TTP.  Tender left greater trochanter and less so posterior to this. FROM. Strength 5-/5 left hip abduction, 5/5 all other BLE muscle groups.   1+ MSRs in patellar and achilles tendons, equal bilaterally. Negative SLRs. Sensation intact to light touch bilaterally. Negative logroll bilateral hips Fabers causes lateral hip pain on  left - no pain at SI joint.  Negative right.  Negative piriformis stretches bilaterally.    Assessment & Plan:  1. 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  Shown home stretches, handout provided.  Hip abduction strengthening exercise shown as well.  Icing, tylenol, meloxicam for pain.  Discussed formal PT with iontophoresis, cortisone injection as possibilities - would like to start with conservative treatment first.  F/u in 1 month.

## 2012-02-09 LAB — URINE CULTURE: Colony Count: >=100000

## 2012-02-12 ENCOUNTER — Ambulatory Visit (HOSPITAL_COMMUNITY): Admission: RE | Admit: 2012-02-12 | Payer: BC Managed Care – PPO | Source: Ambulatory Visit

## 2012-02-14 ENCOUNTER — Encounter: Payer: Self-pay | Admitting: Internal Medicine

## 2012-02-14 ENCOUNTER — Encounter: Payer: BC Managed Care – PPO | Admitting: Internal Medicine

## 2012-02-14 ENCOUNTER — Ambulatory Visit (INDEPENDENT_AMBULATORY_CARE_PROVIDER_SITE_OTHER): Payer: BC Managed Care – PPO | Admitting: Internal Medicine

## 2012-02-14 VITALS — BP 130/66 | HR 73 | Temp 97.3°F | Resp 16 | Ht 65.0 in | Wt 165.0 lb

## 2012-02-14 DIAGNOSIS — E039 Hypothyroidism, unspecified: Secondary | ICD-10-CM

## 2012-02-14 DIAGNOSIS — I10 Essential (primary) hypertension: Secondary | ICD-10-CM

## 2012-02-14 DIAGNOSIS — E559 Vitamin D deficiency, unspecified: Secondary | ICD-10-CM

## 2012-02-14 DIAGNOSIS — Z90710 Acquired absence of both cervix and uterus: Secondary | ICD-10-CM

## 2012-02-14 DIAGNOSIS — E782 Mixed hyperlipidemia: Secondary | ICD-10-CM

## 2012-02-14 DIAGNOSIS — IMO0002 Reserved for concepts with insufficient information to code with codable children: Secondary | ICD-10-CM

## 2012-02-14 DIAGNOSIS — M129 Arthropathy, unspecified: Secondary | ICD-10-CM

## 2012-02-14 DIAGNOSIS — M1990 Unspecified osteoarthritis, unspecified site: Secondary | ICD-10-CM

## 2012-02-14 DIAGNOSIS — Z87440 Personal history of urinary (tract) infections: Secondary | ICD-10-CM

## 2012-02-14 LAB — POCT URINALYSIS DIPSTICK
Protein, UA: NEGATIVE
Spec Grav, UA: 1.015
Urobilinogen, UA: NEGATIVE
pH, UA: 7

## 2012-02-14 NOTE — Progress Notes (Signed)
Subjective:    Patient ID: Lindsay Hodges, female    DOB: Jul 23, 1956, 56 y.o.   MRN: [id number]  HPI Lindsay Hodges is here for comprehensive eval.  Overall doing well.  Occasional arthralgias in hands.  She is taking weekly vitamin D.    See Tg's  She is on Lipitor  Allergies  Allergen Reactions  . Penicillins     REACTION: swelling/dyspnea   Past Medical History  Diagnosis Date  . Excessive or frequent menstruation   . Carotid disease, bilateral   . Edema   . Elevated sed rate     remote history of myelosuppressive disorder  . Hyperlipidemia   . Hypertension   . Grave's disease   . Arthritis    Past Surgical History  Procedure Date  . Mouth surgery     jaw surgery   History   Social History  . Marital Status: Married    Spouse Name: N/A    Number of Children: N/A  . Years of Education: N/A   Occupational History  .      [employer]   Social History Main Topics  . Smoking status: Former Smoker -- 0.5 packs/day for 12 years  . Smokeless tobacco: Not on file  . Alcohol Use: No  . Drug Use: No  . Sexually Active: Not on file   Other Topics Concern  . Not on file   Social History Narrative  . No narrative on file   Family History  Problem Relation Age of Onset  . Hypertension Mother   . Diabetes Neg Hx   . Heart attack Neg Hx   . Hyperlipidemia Neg Hx   . Sudden death Neg Hx    Patient Active Problem List  Diagnoses  . HYPOTHYROIDISM  . Mixed hyperlipidemia  . ESSENTIAL HYPERTENSION, BENIGN  . HEMATOMETRA  . MENORRHAGIA  . LEG EDEMA  . CAROTID BRUIT, LEFT  . CHEST PAIN UNSPECIFIED  . Toe injury  . Hyperlipidemia  . Hypertension  . Grave's disease  . Arthritis  . Carotid disease, bilateral  . Left hip pain  . Vitamin d deficiency   Current Outpatient Prescriptions on File Prior to Visit  Medication Sig Dispense Refill  . aspirin 81 MG tablet Take 81 mg by mouth daily.        Marland Kitchen atorvastatin (LIPITOR) 10 MG tablet Take 10 mg by mouth  daily.        . Cholecalciferol (VITAMIN D3) 50000 UNITS CAPS Take 1 capsule by mouth once a week.  4 capsule  0  . ciprofloxacin (CIPRO) 500 MG tablet Take 1 tablet (500 mg total) by mouth 2 (two) times daily.  10 tablet  0  . levothyroxine (SYNTHROID, LEVOTHROID) 75 MCG tablet Take 75 mcg by mouth daily. Except for Saturday take 50 mcg.       . losartan (COZAAR) 50 MG tablet TAKE 1 TABLET EVERY DAY  90 tablet  3  . meloxicam (MOBIC) 15 MG tablet Take 1 tablet (15 mg total) by mouth daily. With food.  30 tablet  1       Review of Systems  Constitutional: Negative.   HENT: Negative.   Eyes: Negative.   Respiratory: Negative.   Cardiovascular: Negative.   Gastrointestinal: Negative.   Genitourinary: Negative.   Musculoskeletal: Positive for arthralgias.  Skin: Negative.   Neurological: Negative.   Hematological: Negative.   Psychiatric/Behavioral: Negative.       Objective:   Physical Exam Physical Exam  Nursing note  and vitals reviewed.  Constitutional: She is oriented to person, place, and time. She appears well-developed and well-nourished.  HENT:  Head: Normocephalic and atraumatic.  Right Ear: Tympanic membrane and ear canal normal. No drainage. Tympanic membrane is not injected and not erythematous.  Left Ear: Tympanic membrane and ear canal normal. No drainage. Tympanic membrane is not injected and not erythematous.  Nose: Nose normal. Right sinus exhibits no maxillary sinus tenderness and no frontal sinus tenderness. Left sinus exhibits no maxillary sinus tenderness and no frontal sinus tenderness.  Mouth/Throat: Oropharynx is clear and moist. No oral lesions. No oropharyngeal exudate.  Eyes: Conjunctivae and EOM are normal. Pupils are equal, round, and reactive to light.  Neck: Normal range of motion. Neck supple. No JVD present. Carotid bruit is not present. No mass and no thyromegaly present.  Cardiovascular: Normal rate, regular rhythm, S1 normal, S2 normal and  intact distal pulses. Exam reveals no gallop and no friction rub.  No murmur heard.  Pulses:  Carotid pulses are 2+ on the right side, and 2+ on the left side.  Dorsalis pedis pulses are 2+ on the right side, and 2+ on the left side.  No carotid bruit. No LE edema  Pulmonary/Chest: Breath sounds normal. She has no wheezes. She has no rales. She exhibits no tenderness.  Abdominal: Soft. Bowel sounds are normal. She exhibits no distension and no mass. There is no hepatosplenomegaly. There is no tenderness. There is no CVA tenderness.  Musculoskeletal: Normal range of motion.  No active synovitis to joints.  Lymphadenopathy:  She has no cervical adenopathy.  She has no axillary adenopathy.  Right: No inguinal and no supraclavicular adenopathy present.  Left: No inguinal and no supraclavicular adenopathy present.  Neurological: She is alert and oriented to person, place, and time. She has normal strength and normal reflexes. She displays no tremor. No cranial nerve deficit or sensory deficit. Coordination and gait normal.  Skin: Skin is warm and dry. No rash noted. No cyanosis. Nails show no clubbing.  Psychiatric: She has a normal mood and affect. Her speech is normal and behavior is normal. Cognition and memory are normal.        Assessment & Plan:  1)  Health Maintenance  See scanned HM sheet  Mm is pending for June 2)  Hypertriglyceridemia  Ok to take fish oil 2 gms daily  She is on a statin  See me for fasting levels in 6 months 3)  HTN well controlled 4)  DJD 5)  Vit D deficiency  50,000 units for 4 weeks then counseled 2000 units daily 6)  Hyporthyroidism  TSH at goal

## 2012-02-14 NOTE — Patient Instructions (Signed)
See me in 6 months   Call if needed

## 2012-03-03 ENCOUNTER — Ambulatory Visit (INDEPENDENT_AMBULATORY_CARE_PROVIDER_SITE_OTHER): Payer: Self-pay | Admitting: Family Medicine

## 2012-03-03 ENCOUNTER — Encounter: Payer: Self-pay | Admitting: Family Medicine

## 2012-03-03 VITALS — BP 132/78 | HR 81 | Temp 97.7°F | Ht 65.0 in | Wt 162.0 lb

## 2012-03-03 DIAGNOSIS — M25559 Pain in unspecified hip: Secondary | ICD-10-CM

## 2012-03-03 DIAGNOSIS — M25552 Pain in left hip: Secondary | ICD-10-CM

## 2012-03-04 ENCOUNTER — Encounter: Payer: Self-pay | Admitting: Family Medicine

## 2012-03-04 NOTE — Progress Notes (Signed)
Subjective:    Patient ID: Lindsay Hodges, female    DOB: Apr 13, 1956, 56 y.o.   MRN: [id number]  PCP: Dr Constance Goltz  Hip Pain    56 yo F here for f/u left hip pain.  5/8: Patient denies known injury. She states several years ago she had similar pain in left hip that improved with a cortisone injection. Reports over past several months has had worsening lateral left hip pain. Worse when crossing legs and lying on left side. No back pain. No groin pain. No numbness or tingling. No radiation into left leg. Has not tried PT. Has taken occasional aleve.  6/3: Patient reports her pain has persisted lateral left hip. She has been compliant with home stretches and exercises. Using aleve instead of meloxicam. Icing some. No new complaints and no changes from prior visit. Would like to try an injection.  Past Medical History  Diagnosis Date  . Excessive or frequent menstruation   . Carotid disease, bilateral   . Edema   . Elevated sed rate     remote history of myelosuppressive disorder  . Hyperlipidemia   . Hypertension   . Grave's disease   . Arthritis     Current Outpatient Prescriptions on File Prior to Visit  Medication Sig Dispense Refill  . aspirin 81 MG tablet Take 81 mg by mouth daily.        Marland Kitchen atorvastatin (LIPITOR) 10 MG tablet Take 10 mg by mouth daily.        . Cholecalciferol (VITAMIN D3) 50000 UNITS CAPS Take 1 capsule by mouth once a week.  4 capsule  0  . levothyroxine (SYNTHROID, LEVOTHROID) 75 MCG tablet Take 75 mcg by mouth daily. Except for Saturday take 50 mcg.       . losartan (COZAAR) 50 MG tablet TAKE 1 TABLET EVERY DAY  90 tablet  3  . meloxicam (MOBIC) 15 MG tablet Take 1 tablet (15 mg total) by mouth daily. With food.  30 tablet  1    Past Surgical History  Procedure Date  . Mouth surgery     jaw surgery    Allergies  Allergen Reactions  . Penicillins     REACTION: swelling/dyspnea    History   Social History  . Marital Status:  Married    Spouse Name: N/A    Number of Children: N/A  . Years of Education: N/A   Occupational History  .      [employer]   Social History Main Topics  . Smoking status: Former Smoker -- 0.5 packs/day for 12 years  . Smokeless tobacco: Not on file  . Alcohol Use: No  . Drug Use: No  . Sexually Active: Not on file   Other Topics Concern  . Not on file   Social History Narrative  . No narrative on file    Family History  Problem Relation Age of Onset  . Hypertension Mother   . Diabetes Neg Hx   . Heart attack Neg Hx   . Hyperlipidemia Neg Hx   . Sudden death Neg Hx     BP 132/78  Pulse 81  Temp(Src) 97.7 F (36.5 C) (Oral)  Ht 5\' 5"  (1.651 m)  Wt 162 lb (73.483 kg)  BMI 26.96 kg/m2  Review of Systems  See HPI above.    Objective:   Physical Exam  Gen: NAD  Back/L hip: No gross deformity, scoliosis. No paraspinal TTP.  No midline or bony TTP.  Tender left  greater trochanter. FROM. Negative SLRs. Sensation intact to light touch bilaterally.     Assessment & Plan:  1. 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  Given trochanteric bursa injection today.  Declined PT for now.  Continue home stretches and exercises.  Icing, tylenol, aleve.  F/u in 1 month.  If not improving consider repeat injection +/- formal PT.  After informed written consent patient was lying on right side on exam table.  Area of maximal pain over greater trochanter located.  Area prepped with alcohol swab then greater trochanteric bursa injected with 6:2 marcaine: depomedrol.  Patient tolerated procedure well without immediate complications.

## 2012-03-04 NOTE — Assessment & Plan Note (Signed)
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  Given trochanteric bursa injection today.  Declined PT for now.  Continue home stretches and exercises.  Icing, tylenol, aleve.  F/u in 1 month.  If not improving consider repeat injection +/- formal PT.  After informed written consent patient was lying on right side on exam table.  Area of maximal pain over greater trochanter located.  Area prepped with alcohol swab then greater trochanteric bursa injected with 6:2 marcaine: depomedrol.  Patient tolerated procedure well without immediate complications.

## 2012-03-11 ENCOUNTER — Other Ambulatory Visit: Payer: Self-pay | Admitting: Internal Medicine

## 2012-03-11 ENCOUNTER — Ambulatory Visit (HOSPITAL_COMMUNITY): Admission: RE | Admit: 2012-03-11 | Payer: BC Managed Care – PPO | Source: Ambulatory Visit

## 2012-03-11 ENCOUNTER — Ambulatory Visit (HOSPITAL_COMMUNITY)
Admission: RE | Admit: 2012-03-11 | Discharge: 2012-03-11 | Disposition: A | Discharge status: Home / Self Care | Payer: BC Managed Care – PPO | Source: Ambulatory Visit | Attending: Internal Medicine | Admitting: Internal Medicine

## 2012-03-11 DIAGNOSIS — Z1231 Encounter for screening mammogram for malignant neoplasm of breast: Secondary | ICD-10-CM

## 2012-03-13 ENCOUNTER — Ambulatory Visit (INDEPENDENT_AMBULATORY_CARE_PROVIDER_SITE_OTHER): Payer: BC Managed Care – PPO | Admitting: Cardiovascular Disease

## 2012-03-13 ENCOUNTER — Telehealth: Payer: Self-pay | Admitting: *Deleted

## 2012-03-13 ENCOUNTER — Encounter: Payer: Self-pay | Admitting: Cardiovascular Disease

## 2012-03-13 VITALS — BP 136/65 | HR 78 | Ht 65.0 in | Wt 163.0 lb

## 2012-03-13 DIAGNOSIS — E782 Mixed hyperlipidemia: Secondary | ICD-10-CM

## 2012-03-13 DIAGNOSIS — R0989 Other specified symptoms and signs involving the circulatory and respiratory systems: Secondary | ICD-10-CM

## 2012-03-13 DIAGNOSIS — I10 Essential (primary) hypertension: Secondary | ICD-10-CM

## 2012-03-13 DIAGNOSIS — R079 Chest pain, unspecified: Secondary | ICD-10-CM

## 2012-03-13 NOTE — Patient Instructions (Addendum)
Your physician wants you to follow-up in: YEAR WITH DR Haywood Filler will receive a reminder letter in the mail two months in advance. If you don't receive a letter, please call our office to schedule the follow-up appointment. Your physician recommends that you continue on your current medications as directed. Please refer to the Current Medication list given to you today. Your physician has requested that you have a carotid duplex. This test is an ultrasound of the carotid arteries in your neck. It looks at blood flow through these arteries that supply the brain with blood. Allow one hour for this exam. There are no restrictions or special instructions. 11-15-11  DX BRUIT

## 2012-03-13 NOTE — Telephone Encounter (Signed)
LEFT MESSAGE FOR PT TO CALL BACK  HAVE AN EARLIER APPT FOR TODAY DID NOT KNOW IF COULD COME IN AT  2:00 PM INSTEAD OF 4:30 PM .Zack Seal

## 2012-03-13 NOTE — Progress Notes (Signed)
Patient ID: Lindsay Hodges, female   DOB: [birth year], 56 y.o.   MRN: [id number] F/U for carotid bruit and chest pain.   She notes that she had some extensive dental surgery right before Christmas. She was not compliant with her post op antibiotics. About a week later, she began to have this dull feeling in the midsternal chest region. It would just come and go. Never really lasted long. Was not exertional in nature.  Also noted that her left arm felt a little heavier. This also comes and goes. There is nothing she can do to bring the symptoms on or relieve them. She has no other associated symptoms. She has felt more fatigued. Was walking 3 to 5 miles but has really slacked off due to no energy.   She had a stress myouve in 2010 with positive ECG and normal images F/U myovue 10/15/11 with positive ECG but normal images also  Known right carotid bruit.  2/13 had 60-79% LICA and 40-59% RICA stable over last two years No TIA symptoms and on ASA  ROS: Denies fever, malais, weight loss, blurry vision, decreased visual acuity, cough, sputum, SOB, hemoptysis, pleuritic pain, palpitaitons, heartburn, abdominal pain, melena, lower extremity edema, claudication, or rash.  All other systems reviewed and negative  General: Affect appropriate Healthy:  appears stated age HEENT: normal Neck supple with no adenopathy JVP normal right  bruits no thyromegaly Lungs clear with no wheezing and good diaphragmatic motion Heart:  S1/S2 no murmur, no rub, gallop or click PMI normal Abdomen: benighn, BS positve, no tenderness, no AAA no bruit.  No HSM or HJR Distal pulses intact with no bruits No edema Neuro non-focal Skin warm and dry No muscular weakness   Current Outpatient Prescriptions  Medication Sig Dispense Refill  . aspirin 81 MG tablet Take 81 mg by mouth daily.        Marland Kitchen atorvastatin (LIPITOR) 10 MG tablet Take 10 mg by mouth daily.        Marland Kitchen levothyroxine (SYNTHROID, LEVOTHROID) 75 MCG tablet Take 75  mcg by mouth daily. Except for Saturday take 50 mcg.       . losartan (COZAAR) 50 MG tablet TAKE 1 TABLET EVERY DAY  90 tablet  3    Allergies  Penicillins  Electrocardiogram: 10/09/11 NSR rate 65 normal ECG  Assessment and Plan

## 2012-03-13 NOTE — Assessment & Plan Note (Signed)
F/U duplex 2/14 stable moderate bilateral disease 60-79% on left  ASA

## 2012-03-13 NOTE — Assessment & Plan Note (Signed)
Cholesterol is at goal.  Continue current dose of statin and diet Rx.  No myalgias or side effects.  F/U  LFT's in 6 months. Lab Results  Component Value Date   LDLCALC Comment:   Not calculated due to Triglyceride >400. Suggest ordering Direct LDL (Unit Code: 16109).   Total Cholesterol/HDL Ratio:CHD Risk                        Coronary Heart Disease Risk Table                                        Men       Women          1/2 Average Risk              3.4        3.3              Average Risk              5.0        4.4           2X Average Risk              9.6        7.1           3X Average Risk             23.4       11.0 Use the calculated Patient Ratio above and the CHD Risk table  to determine the patient's CHD Risk. ATP III Classification (LDL):       < 100        mg/dL         Optimal      604 - 129     mg/dL         Near or Above Optimal      130 - 159     mg/dL         Borderline High      160 - 189     mg/dL         High       > 540        mg/dL         Very High   06/08/1190

## 2012-03-13 NOTE — Assessment & Plan Note (Signed)
Resolved Known false positive ECG;s Normal nuclear images 1/13.  Would consdier cardiac CT in future if symptoms return since she has vascular disease in carotids

## 2012-03-13 NOTE — Assessment & Plan Note (Signed)
Well controlled.  Continue current medications and low sodium Dash type diet.    

## 2012-06-06 ENCOUNTER — Telehealth: Payer: Self-pay | Admitting: Cardiovascular Disease

## 2012-06-06 NOTE — Telephone Encounter (Signed)
NEW MESSAGE:  PT GOT A LETTER ABOUT SCHEDULING THE CAROTID, BUT IS UNSURE IF SHE NEEDS TO HAVE IT BECAUSE DR Eden Emms DID NOT MENTION IT AT THE VISIT.  PLEASE CHECK AND CALL PT. BACK AND ADVISE.  [phone]

## 2012-06-09 NOTE — Telephone Encounter (Signed)
LAST CAROTID  REPORT   STATED  PT  NEEDS REPEAT IN 6 MONTHS  CAROTID SCHEDULED FOR  06-17-12 AT 3:30 PM .Lindsay Hodges

## 2012-06-17 ENCOUNTER — Encounter (INDEPENDENT_AMBULATORY_CARE_PROVIDER_SITE_OTHER): Payer: BC Managed Care – PPO

## 2012-06-17 DIAGNOSIS — I6529 Occlusion and stenosis of unspecified carotid artery: Secondary | ICD-10-CM

## 2012-06-17 DIAGNOSIS — R0989 Other specified symptoms and signs involving the circulatory and respiratory systems: Secondary | ICD-10-CM

## 2012-08-11 ENCOUNTER — Ambulatory Visit: Payer: BC Managed Care – PPO | Admitting: *Deleted

## 2012-08-11 DIAGNOSIS — I10 Essential (primary) hypertension: Secondary | ICD-10-CM

## 2012-08-11 DIAGNOSIS — M1990 Unspecified osteoarthritis, unspecified site: Secondary | ICD-10-CM

## 2012-08-11 DIAGNOSIS — E039 Hypothyroidism, unspecified: Secondary | ICD-10-CM

## 2012-08-11 DIAGNOSIS — E782 Mixed hyperlipidemia: Secondary | ICD-10-CM

## 2012-08-11 DIAGNOSIS — Z139 Encounter for screening, unspecified: Secondary | ICD-10-CM

## 2012-08-11 LAB — CBC WITH DIFFERENTIAL/PLATELET
Basophils Absolute: 0 10*3/uL (ref 0.0–0.1)
Eosinophils Relative: 4 % (ref 0–5)
Lymphocytes Relative: 27 % (ref 12–46)
Lymphs Abs: 1.9 10*3/uL (ref 0.7–4.0)
MCV: 89.1 fL (ref 78.0–100.0)
Neutro Abs: 4.1 10*3/uL (ref 1.7–7.7)
Neutrophils Relative %: 58 % (ref 43–77)
Platelets: 232 10*3/uL (ref 150–400)
RBC: 4.48 MIL/uL (ref 3.87–5.11)
WBC: 6.9 10*3/uL (ref 4.0–10.5)

## 2012-08-11 NOTE — Patient Instructions (Addendum)
Check mychart for labs

## 2012-08-12 LAB — COMPREHENSIVE METABOLIC PANEL
ALT: 15 U/L (ref 0–35)
AST: 19 U/L (ref 0–37)
Albumin: 4.7 g/dL (ref 3.5–5.2)
CO2: 25 mEq/L (ref 19–32)
Calcium: 9.4 mg/dL (ref 8.4–10.5)
Chloride: 101 mEq/L (ref 96–112)
Potassium: 4.5 mEq/L (ref 3.5–5.3)
Sodium: 138 mEq/L (ref 135–145)
Total Protein: 7.4 g/dL (ref 6.0–8.3)

## 2012-08-12 LAB — LIPID PANEL
Cholesterol: 157 mg/dL (ref 0–200)
VLDL: 21 mg/dL (ref 0–40)

## 2012-08-12 LAB — ANA: Anti Nuclear Antibody(ANA): NEGATIVE

## 2012-08-12 LAB — VITAMIN D 25 HYDROXY (VIT D DEFICIENCY, FRACTURES): Vit D, 25-Hydroxy: 30 ng/mL (ref 30–89)

## 2012-08-21 ENCOUNTER — Ambulatory Visit (INDEPENDENT_AMBULATORY_CARE_PROVIDER_SITE_OTHER): Payer: BC Managed Care – PPO | Admitting: Internal Medicine

## 2012-08-21 ENCOUNTER — Encounter: Payer: Self-pay | Admitting: Internal Medicine

## 2012-08-21 VITALS — BP 123/62 | HR 69 | Temp 96.7°F | Resp 16 | Wt 163.3 lb

## 2012-08-21 DIAGNOSIS — M129 Arthropathy, unspecified: Secondary | ICD-10-CM

## 2012-08-21 DIAGNOSIS — E559 Vitamin D deficiency, unspecified: Secondary | ICD-10-CM

## 2012-08-21 DIAGNOSIS — I779 Disorder of arteries and arterioles, unspecified: Secondary | ICD-10-CM

## 2012-08-21 DIAGNOSIS — M1990 Unspecified osteoarthritis, unspecified site: Secondary | ICD-10-CM

## 2012-08-21 DIAGNOSIS — R42 Dizziness and giddiness: Secondary | ICD-10-CM

## 2012-08-21 MED ORDER — MECLIZINE HCL 25 MG PO TABS: 25.0000 mg | ORAL_TABLET | Freq: Three times a day (TID) | ORAL | Status: DC | PRN

## 2012-08-21 MED ORDER — DICLOFENAC SODIUM 1 % TD GEL: 2.0000 g | Freq: Two times a day (BID) | TRANSDERMAL | Status: DC

## 2012-08-21 NOTE — Patient Instructions (Addendum)
Call if any worsening

## 2012-08-21 NOTE — Progress Notes (Signed)
Subjective:    Patient ID: Lindsay Hodges, female    DOB: 17-May-1956, 56 y.o.   MRN: [id number]  HPI Lindsay Hodges is here to follow up on her arthralgias.  Primarily she has tenderness along both elbows, medial and lateral compartments.  She also has arthralgias in feet at times.  No swelling or redness to any joints  See labs  ANA, RF neg  She also has had dizziness in the last 6 weeks.  Described as spinning sensationl  Lasts seconds to minutes.  No nausea.  No symptoms when turning head.  She worries as she has 50-79% stenosis in L carotid that is followed q6-12 months by Dr. Eden Emms.  No visual, speech changes.  No paresthesias or motor weakness.    Allergies  Allergen Reactions  . Penicillins     REACTION: swelling/dyspnea   Past Medical History  Diagnosis Date  . Excessive or frequent menstruation   . Carotid disease, bilateral   . Edema   . Elevated sed rate     remote history of myelosuppressive disorder  . Hyperlipidemia   . Hypertension   . Grave's disease   . Arthritis    Past Surgical History  Procedure Date  . Mouth surgery     jaw surgery   History   Social History  . Marital Status: Married    Spouse Name: N/A    Number of Children: N/A  . Years of Education: N/A   Occupational History  .      [employer]   Social History Main Topics  . Smoking status: Former Smoker -- 0.5 packs/day for 12 years  . Smokeless tobacco: Not on file  . Alcohol Use: No  . Drug Use: No  . Sexually Active: Not on file   Other Topics Concern  . Not on file   Social History Narrative  . No narrative on file   Family History  Problem Relation Age of Onset  . Hypertension Mother   . Diabetes Neg Hx   . Heart attack Neg Hx   . Hyperlipidemia Neg Hx   . Sudden death Neg Hx    Patient Active Problem List  Diagnosis  . HYPOTHYROIDISM  . Mixed hyperlipidemia  . ESSENTIAL HYPERTENSION, BENIGN  . HEMATOMETRA  . MENORRHAGIA  . LEG EDEMA  . CAROTID BRUIT, LEFT    . CHEST PAIN UNSPECIFIED  . Toe injury  . Hyperlipidemia  . Hypertension  . Grave's disease  . Arthritis  . Carotid disease, bilateral  . Left hip pain  . Vitamin D deficiency  . H/O hysterectomy with oophorectomy  . Dizziness   Current Outpatient Prescriptions on File Prior to Visit  Medication Sig Dispense Refill  . aspirin 81 MG tablet Take 81 mg by mouth daily.        Marland Kitchen atorvastatin (LIPITOR) 10 MG tablet Take 10 mg by mouth daily.        Marland Kitchen levothyroxine (SYNTHROID, LEVOTHROID) 75 MCG tablet Take 75 mcg by mouth daily. Except for Saturday take 50 mcg.       . losartan (COZAAR) 50 MG tablet TAKE 1 TABLET EVERY DAY  90 tablet  3        Review of Systems See HPI    Objective:   Physical Exam Physical Exam  Nursing note and vitals reviewed.  Constitutional: She is oriented to person, place, and time. She appears well-developed and well-nourished.  HENT:  Tm's  Canals clear  Serous effusions bilaterally.  Head: Normocephalic and atraumatic.  Cardiovascular: Normal rate and regular rhythm. Exam reveals no gallop and no friction rub.  No murmur heard.  Pulmonary/Chest: Breath sounds normal. She has no wheezes. She has no rales.  Neurological: She is alert and oriented to person, place, and time.  Skin: Skin is warm and dry.  Psychiatric: She has a normal mood and affect. Her behavior is normal.  Neurologic  CN II-Xii intact       Assessment & Plan:  Arthralgias:   Ok to try Voltaren gel  b-tid to small joints.  May be tendinitis of elbows.    Dizziness:  May be  Vestibulitis   Will try Antivert 25 mg tid  I offered xray imaging but pt wishes to wait for now.  If persistant over next 4 weeks or any worsening,  She is to call and will get imaging.  Declines PT referral now for vestibular rehab

## 2012-08-26 ENCOUNTER — Other Ambulatory Visit: Payer: Self-pay | Admitting: Internal Medicine

## 2012-08-26 MED ORDER — LEVOTHYROXINE SODIUM 75 MCG PO TABS: 75.0000 ug | ORAL_TABLET | Freq: Every day | ORAL | Status: DC

## 2012-08-26 NOTE — Telephone Encounter (Signed)
Needs refill

## 2012-08-26 NOTE — Telephone Encounter (Signed)
Pt would like refill on Synthroid 50 mcg.  [facility] on [address].  [phone].

## 2012-08-29 ENCOUNTER — Other Ambulatory Visit: Payer: Self-pay | Admitting: *Deleted

## 2012-08-29 MED ORDER — LOSARTAN POTASSIUM 50 MG PO TABS: 50.0000 mg | ORAL_TABLET | Freq: Every day | ORAL | Status: DC

## 2012-09-02 ENCOUNTER — Other Ambulatory Visit: Payer: Self-pay | Admitting: *Deleted

## 2012-09-05 ENCOUNTER — Other Ambulatory Visit: Payer: Self-pay | Admitting: Internal Medicine

## 2012-09-08 ENCOUNTER — Other Ambulatory Visit: Payer: Self-pay | Admitting: *Deleted

## 2012-09-08 NOTE — Telephone Encounter (Signed)
Men=dication refilled on 11/26 will call pharmacy

## 2012-09-09 ENCOUNTER — Other Ambulatory Visit: Payer: Self-pay | Admitting: *Deleted

## 2012-09-09 NOTE — Telephone Encounter (Signed)
Pt states that she also uses 50 mcg of synthroid and her recent refill was for . Pt would like [place] to send a new rx for 50 mcg

## 2012-09-10 MED ORDER — LEVOTHYROXINE SODIUM 50 MCG PO TABS: ORAL_TABLET | ORAL | Status: DC

## 2012-10-10 ENCOUNTER — Other Ambulatory Visit: Payer: Self-pay | Admitting: *Deleted

## 2012-10-10 NOTE — Telephone Encounter (Signed)
Refill request

## 2012-10-12 MED ORDER — LEVOTHYROXINE SODIUM 75 MCG PO TABS: 75.0000 ug | ORAL_TABLET | Freq: Every day | ORAL | Status: DC

## 2012-11-05 ENCOUNTER — Other Ambulatory Visit: Payer: Self-pay | Admitting: Internal Medicine

## 2012-11-06 NOTE — Telephone Encounter (Signed)
Refill request

## 2012-11-15 ENCOUNTER — Other Ambulatory Visit: Payer: Self-pay

## 2012-11-25 ENCOUNTER — Ambulatory Visit (INDEPENDENT_AMBULATORY_CARE_PROVIDER_SITE_OTHER): Payer: BC Managed Care – PPO | Admitting: Internal Medicine

## 2012-11-25 ENCOUNTER — Encounter: Payer: Self-pay | Admitting: Internal Medicine

## 2012-11-25 VITALS — BP 118/82 | HR 64 | Temp 97.5°F | Ht 65.0 in | Wt 165.4 lb

## 2012-11-25 DIAGNOSIS — I10 Essential (primary) hypertension: Secondary | ICD-10-CM

## 2012-11-25 DIAGNOSIS — I779 Disorder of arteries and arterioles, unspecified: Secondary | ICD-10-CM

## 2012-11-25 DIAGNOSIS — R4589 Other symptoms and signs involving emotional state: Secondary | ICD-10-CM

## 2012-11-25 DIAGNOSIS — E039 Hypothyroidism, unspecified: Secondary | ICD-10-CM

## 2012-11-25 DIAGNOSIS — F603 Borderline personality disorder: Secondary | ICD-10-CM

## 2012-11-25 DIAGNOSIS — F338 Other recurrent depressive disorders: Secondary | ICD-10-CM | POA: Insufficient documentation

## 2012-11-25 DIAGNOSIS — E785 Hyperlipidemia, unspecified: Secondary | ICD-10-CM

## 2012-11-25 MED ORDER — PAROXETINE HCL 10 MG PO TABS: 10.0000 mg | ORAL_TABLET | ORAL | Status: DC

## 2012-11-25 MED ORDER — COENZYME Q10 30 MG PO CAPS: 30.0000 mg | ORAL_CAPSULE | Freq: Two times a day (BID) | ORAL | Status: DC

## 2012-11-25 NOTE — Patient Instructions (Addendum)
It was good to see you today. We have reviewed your prior records including labs and tests today Start low dose generic Paxil for mood as discussed - Your prescription(s) have been submitted to your pharmacy. Please take as directed and contact our office if you believe you are having problem(s) with the medication(s). Other medications reviewed and updated - ok to try CoQ 10 as dicussed, no other changes recommended  Please schedule followup in 6-8 weeks for mood and medication recheck, call sooner if problems Depression, Adult Depression refers to feeling sad, low, down in the dumps, blue, gloomy, or empty. In general, there are two kinds of depression: 1. Depression that we all experience from time to time because of upsetting life experiences, including the loss of a job or the ending of a relationship (normal sadness or normal grief). This kind of depression is considered normal, is short lived, and resolves within a few days to 2 weeks. (Depression experienced after the loss of a loved one is called bereavement. Bereavement often lasts longer than 2 weeks but normally gets better with time.) 2. Clinical depression, which lasts longer than normal sadness or normal grief or interferes with your ability to function at home, at work, and in school. It also interferes with your personal relationships. It affects almost every aspect of your life. Clinical depression is an illness. Symptoms of depression also can be caused by conditions other than normal sadness and grief or clinical depression. Examples of these conditions are listed as follows:  Physical illness Some physical illnesses, including underactive thyroid gland (hypothyroidism), severe anemia, specific types of cancer, diabetes, uncontrolled seizures, heart and lung problems, strokes, and chronic pain are commonly associated with symptoms of depression.  Side effects of some prescription medicine In some people, certain types of prescription  medicine can cause symptoms of depression.  Substance abuse Abuse of alcohol and illicit drugs can cause symptoms of depression. SYMPTOMS Symptoms of normal sadness and normal grief include the following:  Feeling sad or crying for short periods of time.  Not caring about anything (apathy).  Difficulty sleeping or sleeping too much.  No longer able to enjoy the things you used to enjoy.  Desire to be by oneself all the time (social isolation).  Lack of energy or motivation.  Difficulty concentrating or remembering.  Change in appetite or weight.  Restlessness or agitation. Symptoms of clinical depression include the same symptoms of normal sadness or normal grief and also the following symptoms:  Feeling sad or crying all the time.  Feelings of guilt or worthlessness.  Feelings of hopelessness or helplessness.  Thoughts of suicide or the desire to harm yourself (suicidal ideation).  Loss of touch with reality (psychotic symptoms). Seeing or hearing things that are not real (hallucinations) or having false beliefs about your life or the people around you (delusions and paranoia). DIAGNOSIS  The diagnosis of clinical depression usually is based on the severity and duration of the symptoms. Your caregiver also will ask you questions about your medical history and substance use to find out if physical illness, use of prescription medicine, or substance abuse is causing your depression. Your caregiver also may order blood tests. TREATMENT  Typically, normal sadness and normal grief do not require treatment. However, sometimes antidepressant medicine is prescribed for bereavement to ease the depressive symptoms until they resolve. The treatment for clinical depression depends on the severity of your symptoms but typically includes antidepressant medicine, counseling with a mental health professional, or a combination  of both. Your caregiver will help to determine what treatment is  best for you. Depression caused by physical illness usually goes away with appropriate medical treatment of the illness. If prescription medicine is causing depression, talk with your caregiver about stopping the medicine, decreasing the dose, or substituting another medicine. Depression caused by abuse of alcohol or illicit drugs abuse goes away with abstinence from these substances. Some adults need professional help in order to stop drinking or using drugs. SEEK IMMEDIATE CARE IF:  You have thoughts about hurting yourself or others.  You lose touch with reality (have psychotic symptoms).  You are taking medicine for depression and have a serious side effect. FOR MORE INFORMATION National Alliance on Mental Illness: www.nami.[employer]: http://www.maynard.net/ Document Released: 09/14/2000 Document Revised: 03/18/2012 Document Reviewed: 12/17/2011 [facility] Patient Information 2013 [place], [place].

## 2012-11-25 NOTE — Assessment & Plan Note (Signed)
BP Readings from Last 3 Encounters:  11/25/12 118/82  08/21/12 123/62  03/13/12 136/65   The current medical regimen is effective;  continue present plan and medications.

## 2012-11-25 NOTE — Assessment & Plan Note (Signed)
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 Symptoms ongoing greater than 6 weeks with history of same required counseling.  Reviewed recent medical screening labs for fatigue. Education provided on constitutional symptoms of dysthymia and depression.  Patient agrees to medication trial for this and overlapping potential of postmenopausal syndrome.  Low-dose Generic Paxil recommended. Patient educated on potential benefit versus side effect of same and understands/agrees.  Patient will call if side effects or other problems,also followup in 6-8 weeks for review of symptoms and titration as needed

## 2012-11-25 NOTE — Assessment & Plan Note (Signed)
On low-dose atorvastatin for same Recommend trial of coenzyme Q10 for counterbalanced of myalgia side effects If ineffective symptom relief or intolerable, consider alternate statin

## 2012-11-25 NOTE — Assessment & Plan Note (Signed)
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 Lab Results  Component Value Date   TSH 2.865 08/11/2012

## 2012-11-25 NOTE — Assessment & Plan Note (Signed)
Carotid ultrasounds September 2013, stable disease: Left 60-79%, right 40-59% Continue medical management of same: Aspirin 81 mg, blood pressure and statin

## 2012-11-25 NOTE — Progress Notes (Signed)
Subjective:    Patient ID: Lindsay Hodges, female    DOB: [birth year], 57 y.o.   MRN: [id number]  HPI New patient to me, here today to establish a primary care relationship Reviewed chronic medical issues:  Dyslipidemia. On statin for same for several years because of associated bilateral carotid disease. No history of TIA or stroke, follows for carotid ultrasound annually and stable over past few years. Complains of myalgias and arthralgias, but mild and intermittent symptoms. Reports 100% compliance with statin as prescribed  Hypertension. On ARB for same. the patient reports compliance with medication(s) as prescribed. Denies adverse side effects.  Hypothyroid. History of ablation of Graves' disease mid 1990s. the patient reports compliance with medication(s) as prescribed. Denies adverse side effects.  Complains of fatigue. Associated with irritability of mood and feeling down. Cries easily and without specific reason. Denies prior history of clinical depression but in counseling for same remotely. Denies current relationship or employment stressors, "I have no reason to be depressed"  Past Medical History  Diagnosis Date  . Carotid disease, bilateral     [place] 9/13: R 40-59%, L 60-79%, stable  . Elevated sed rate     remote history of myelosuppressive disorder  . Hyperlipidemia   . Hypertension   . Grave's disease     s/p I-131 ablation 1990s  . Arthritis   . HYPOTHYROIDISM   . Vitamin D deficiency    Family History  Problem Relation Age of Onset  . Hypertension Mother   . Arthritis Mother   . Diabetes Neg Hx   . Heart attack Neg Hx   . Arthritis Father   . Breast cancer Other   . Hyperlipidemia Mother   . Stroke Mother 31  . Atrial fibrillation Mother    History   Social History  . Marital Status: Married    Spouse Name: N/A    Number of Children: N/A  . Years of Education: N/A   Occupational History  .      [employer]   Social History Main Topics   . Smoking status: Former Smoker -- 0.50 packs/day for 12 years  . Smokeless tobacco: Not on file  . Alcohol Use: No  . Drug Use: No  . Sexually Active: Not on file   Other Topics Concern  . Not on file   Social History Narrative   pharmacy tech III    Review of Systems Constitutional: Negative for fever or weight change.  Respiratory: Negative for cough and shortness of breath.   Cardiovascular: Negative for chest pain or palpitations.  Gastrointestinal: Negative for abdominal pain, no bowel changes.  Musculoskeletal: Negative for gait problem or joint swelling.  Skin: Negative for rash.  Neurological: Negative for dizziness or headache.  No other specific complaints in a complete review of systems (except as listed in HPI above).     Objective:   Physical Exam BP 118/82  Pulse 64  Temp(Src) 97.5 F (36.4 C) (Oral)  Ht 5\' 5"  (1.651 m)  Wt 165 lb 6.4 oz (75.025 kg)  BMI 27.52 kg/m2  SpO2 96% Wt Readings from Last 3 Encounters:  11/25/12 165 lb 6.4 oz (75.025 kg)  08/21/12 163 lb 4.8 oz (74.072 kg)  03/13/12 163 lb (73.936 kg)   Constitutional: She appears well-developed and well-nourished. No distress.  HENT: Head: Normocephalic and atraumatic. Ears: B TMs ok, no erythema or effusion; Nose: Nose normal. Mouth/Throat: Oropharynx is clear and moist. No oropharyngeal exudate.  Eyes: Conjunctivae and EOM are  normal. Pupils are equal, round, and reactive to light. No scleral icterus.  Neck: Normal range of motion. Neck supple. No JVD present. No thyromegaly present.  Cardiovascular: Normal rate, regular rhythm and normal heart sounds.  No murmur heard. No BLE edema. Pulmonary/Chest: Effort normal and breath sounds normal. No respiratory distress. She has no wheezes.  Abdominal: Soft. Bowel sounds are normal. She exhibits no distension. There is no tenderness. no masses Musculoskeletal: Normal range of motion, no joint effusions. No gross deformities Neurological: She is  alert and oriented to person, place, and time. No cranial nerve deficit. Coordination normal.  Skin: Skin is warm and dry. No rash noted. No erythema.  Psychiatric: She has a mildly dysthymic mood and occasionally tearful affect. Her behavior is normal. Judgment and thought content normal.   Lab Results  Component Value Date   WBC 6.9 08/11/2012   HGB 13.2 08/11/2012   HCT 39.9 08/11/2012   PLT 232 08/11/2012   GLUCOSE 57* 08/11/2012   CHOL 157 08/11/2012   TRIG 104 08/11/2012   HDL 60 08/11/2012   LDLCALC 76 08/11/2012   ALT 15 08/11/2012   AST 19 08/11/2012   NA 138 08/11/2012   K 4.5 08/11/2012   CL 101 08/11/2012   CREATININE 0.65 08/11/2012   BUN 13 08/11/2012   CO2 25 08/11/2012   TSH 2.865 08/11/2012   No results found for this basename: ESRSEDRATE, SEDRATE, POCTSEDRATE       Assessment & Plan:   See problem list. Medications and labs reviewed today.  Time spent with pt today 45 minutes, greater than 50% time spent counseling patient on lipids, suspected depression and medication review. Also review of prior records

## 2012-12-01 NOTE — Telephone Encounter (Signed)
Lindsay Hodges  I note in chart that pt saw Dr. Felicity Coyer to establish primary care.  Please call pt to validate and if she now sees Dr. Felicity Coyer,  RX needs to be filled by new MD

## 2012-12-01 NOTE — Telephone Encounter (Signed)
Refill request

## 2013-01-01 ENCOUNTER — Ambulatory Visit (INDEPENDENT_AMBULATORY_CARE_PROVIDER_SITE_OTHER): Payer: BC Managed Care – PPO | Admitting: Internal Medicine

## 2013-01-01 ENCOUNTER — Encounter: Payer: Self-pay | Admitting: Internal Medicine

## 2013-01-01 VITALS — BP 140/72 | HR 68 | Temp 97.0°F | Ht 65.0 in | Wt 160.0 lb

## 2013-01-01 DIAGNOSIS — R319 Hematuria, unspecified: Secondary | ICD-10-CM

## 2013-01-01 DIAGNOSIS — R350 Frequency of micturition: Secondary | ICD-10-CM

## 2013-01-01 LAB — POCT URINALYSIS DIPSTICK
Ketones, UA: NEGATIVE
Spec Grav, UA: 1.025
Urobilinogen, UA: 0.2

## 2013-01-01 MED ORDER — NITROFURANTOIN MONOHYD MACRO 100 MG PO CAPS: 100.0000 mg | ORAL_CAPSULE | Freq: Two times a day (BID) | ORAL | Status: DC

## 2013-01-01 NOTE — Progress Notes (Signed)
HPI  Pt presents to the clinic today with c/o urinary urgency, frequency, dysuria and blood in her urine. This started 2 days ago. She has had a UTI in the past and reports this feels the same. She has not taken anything OTC for this. She denies fever, chills or body aches.   Review of Systems  Past Medical History  Diagnosis Date  . Carotid disease, bilateral     [place] 9/13: R 40-59%, L 60-79%, stable  . Elevated sed rate     remote history of myelosuppressive disorder  . Hyperlipidemia   . Hypertension   . Grave's disease     s/p I-131 ablation 1990s  . Arthritis   . HYPOTHYROIDISM   . Vitamin D deficiency     Family History  Problem Relation Age of Onset  . Hypertension Mother   . Arthritis Mother   . Diabetes Neg Hx   . Heart attack Neg Hx   . Arthritis Father   . Breast cancer Other   . Hyperlipidemia Mother   . Stroke Mother 91  . Atrial fibrillation Mother     History   Social History  . Marital Status: Married    Spouse Name: N/A    Number of Children: N/A  . Years of Education: N/A   Occupational History  .      [employer]   Social History Main Topics  . Smoking status: Former Smoker -- 0.50 packs/day for 12 years  . Smokeless tobacco: Not on file  . Alcohol Use: No  . Drug Use: No  . Sexually Active: Not on file   Other Topics Concern  . Not on file   Social History Narrative   pharmacy tech III    Allergies  Allergen Reactions  . Penicillins     REACTION: swelling/dyspnea    Constitutional: Denies fever, malaise, fatigue, headache or abrupt weight changes.   GU: Pt reports urgency, frequency, blood in urine and pain with urination. Denies burning sensation, odor or discharge. Skin: Denies redness, rashes, lesions or ulcercations.   No other specific complaints in a complete review of systems (except as listed in HPI above).    Objective:   Physical Exam  BP 140/72  Pulse 68  Temp(Src) 97 F (36.1 C) (Oral)  Ht 5\' 5"   (1.651 m)  Wt 160 lb (72.576 kg)  BMI 26.63 kg/m2  SpO2 96% Wt Readings from Last 3 Encounters:  01/01/13 160 lb (72.576 kg)  11/25/12 165 lb 6.4 oz (75.025 kg)  08/21/12 163 lb 4.8 oz (74.072 kg)    General: Appears her stated age, well developed, well nourished in NAD. Cardiovascular: Normal rate and rhythm. S1,S2 noted.  No murmur, rubs or gallops noted. No JVD or BLE edema. No carotid bruits noted. Pulmonary/Chest: Normal effort and positive vesicular breath sounds. No respiratory distress. No wheezes, rales or ronchi noted.  Abdomen: Soft and nontender. Normal bowel sounds, no bruits noted. No distention or masses noted. Liver, spleen and kidneys non palpable. Tender to palpation over the bladder area. No CVA tenderness.      Assessment & Plan:   Urgency, Frequency, Dysuria, related to UTI:  Urinalysis eRx sent if for Macrobid 100 mg BID x 5 days Drink plenty of fluids  RTC as needed or if symptoms persist.

## 2013-01-01 NOTE — Patient Instructions (Signed)
Urinary Tract Infection Urinary tract infections (UTIs) can develop anywhere along your urinary tract. Your urinary tract is your body's drainage system for removing wastes and extra water. Your urinary tract includes two kidneys, two ureters, a bladder, and a urethra. Your kidneys are a pair of bean-shaped organs. Each kidney is about the size of your fist. They are located below your ribs, one on each side of your spine. CAUSES Infections are caused by microbes, which are microscopic organisms, including fungi, viruses, and bacteria. These organisms are so small that they can only be seen through a microscope. Bacteria are the microbes that most commonly cause UTIs. SYMPTOMS  Symptoms of UTIs may vary by age and gender of the patient and by the location of the infection. Symptoms in young women typically include a frequent and intense urge to urinate and a painful, burning feeling in the bladder or urethra during urination. Older women and men are more likely to be tired, shaky, and weak and have muscle aches and abdominal pain. A fever may mean the infection is in your kidneys. Other symptoms of a kidney infection include pain in your back or sides below the ribs, nausea, and vomiting. DIAGNOSIS To diagnose a UTI, your caregiver will ask you about your symptoms. Your caregiver also will ask to provide a urine sample. The urine sample will be tested for bacteria and white blood cells. White blood cells are made by your body to help fight infection. TREATMENT  Typically, UTIs can be treated with medication. Because most UTIs are caused by a bacterial infection, they usually can be treated with the use of antibiotics. The choice of antibiotic and length of treatment depend on your symptoms and the type of bacteria causing your infection. HOME CARE INSTRUCTIONS  If you were prescribed antibiotics, take them exactly as your caregiver instructs you. Finish the medication even if you feel better after you  have only taken some of the medication.  Drink enough water and fluids to keep your urine clear or pale yellow.  Avoid caffeine, tea, and carbonated beverages. They tend to irritate your bladder.  Empty your bladder often. Avoid holding urine for long periods of time.  Empty your bladder before and after sexual intercourse.  After a bowel movement, women should cleanse from front to back. Use each tissue only once. SEEK MEDICAL CARE IF:   You have back pain.  You develop a fever.  Your symptoms do not begin to resolve within 3 days. SEEK IMMEDIATE MEDICAL CARE IF:   You have severe back pain or lower abdominal pain.  You develop chills.  You have nausea or vomiting.  You have continued burning or discomfort with urination. MAKE SURE YOU:   Understand these instructions.  Will watch your condition.  Will get help right away if you are not doing well or get worse. Document Released: 06/27/2005 Document Revised: 03/18/2012 Document Reviewed: 10/26/2011 [employer] Patient Information 2013 [employer].  

## 2013-01-07 ENCOUNTER — Ambulatory Visit: Payer: BC Managed Care – PPO | Admitting: Internal Medicine

## 2013-01-19 ENCOUNTER — Encounter: Payer: Self-pay | Admitting: Internal Medicine

## 2013-01-19 ENCOUNTER — Ambulatory Visit (INDEPENDENT_AMBULATORY_CARE_PROVIDER_SITE_OTHER): Payer: BC Managed Care – PPO | Admitting: Internal Medicine

## 2013-01-19 VITALS — BP 128/62 | HR 79 | Temp 97.8°F

## 2013-01-19 DIAGNOSIS — M129 Arthropathy, unspecified: Secondary | ICD-10-CM

## 2013-01-19 DIAGNOSIS — Z124 Encounter for screening for malignant neoplasm of cervix: Secondary | ICD-10-CM

## 2013-01-19 DIAGNOSIS — R4589 Other symptoms and signs involving emotional state: Secondary | ICD-10-CM

## 2013-01-19 DIAGNOSIS — E039 Hypothyroidism, unspecified: Secondary | ICD-10-CM

## 2013-01-19 DIAGNOSIS — M1990 Unspecified osteoarthritis, unspecified site: Secondary | ICD-10-CM

## 2013-01-19 DIAGNOSIS — F603 Borderline personality disorder: Secondary | ICD-10-CM

## 2013-01-19 MED ORDER — NAPROXEN SODIUM 220 MG PO TABS: 220.0000 mg | ORAL_TABLET | Freq: Two times a day (BID) | ORAL | Status: DC | PRN

## 2013-01-19 NOTE — Addendum Note (Signed)
Addended by: Rene Paci A on: 01/19/2013 04:27 PM   Modules accepted: Orders

## 2013-01-19 NOTE — Progress Notes (Signed)
  Subjective:    Patient ID: Lindsay Hodges, female    DOB: May 29, 1956, 57 y.o.   MRN: [id number]  HPI  Here for follow up - reviewed chronic medical issues:  Dyslipidemia. On statin for same for several years because of associated bilateral carotid disease. No history of TIA or stroke, follows for carotid ultrasound annually and stable over past few years. Complains of myalgias and arthralgias, but mild and intermittent symptoms. Reports 100% compliance with statin as prescribed  Hypertension. On ARB for same. the patient reports compliance with medication(s) as prescribed. Denies adverse side effects.  Hypothyroid. History of ablation of Graves' disease mid 1990s. the patient reports compliance with medication(s) as prescribed. Denies adverse side effects.    Past Medical History  Diagnosis Date  . Carotid disease, bilateral     [place] 9/13: R 40-59%, L 60-79%, stable  . Elevated sed rate     remote history of myelosuppressive disorder  . Hyperlipidemia   . Hypertension   . Grave's disease     s/p I-131 ablation 1990s  . Arthritis   . HYPOTHYROIDISM   . Vitamin D deficiency     Review of Systems  Respiratory: Negative for cough and shortness of breath.   Cardiovascular: Negative for chest pain or palpitations.      Objective:   Physical Exam  BP 128/62  Pulse 79  Temp(Src) 97.8 F (36.6 C) (Oral)  SpO2 95% Wt Readings from Last 3 Encounters:  01/01/13 160 lb (72.576 kg)  11/25/12 165 lb 6.4 oz (75.025 kg)  08/21/12 163 lb 4.8 oz (74.072 kg)   Constitutional: She appears well-developed and well-nourished. No distress.   Neck: Normal range of motion. Neck supple. No JVD present. No thyromegaly present.  Cardiovascular: Normal rate, regular rhythm and normal heart sounds.  No murmur heard. No BLE edema. Pulmonary/Chest: Effort normal and breath sounds normal. No respiratory distress. She has no wheezes.  MSkel: R index finger with PIP>DIP mild swelling - FROM and  ligamenotu fx intact - nonpainful ROM and palpation Psychiatric: She has a mildly dysthymic mood and occasionally tearful affect. Her behavior is normal. Judgment and thought content normal.   Lab Results  Component Value Date   WBC 6.9 08/11/2012   HGB 13.2 08/11/2012   HCT 39.9 08/11/2012   PLT 232 08/11/2012   GLUCOSE 57* 08/11/2012   CHOL 157 08/11/2012   TRIG 104 08/11/2012   HDL 60 08/11/2012   LDLCALC 76 08/11/2012   ALT 15 08/11/2012   AST 19 08/11/2012   NA 138 08/11/2012   K 4.5 08/11/2012   CL 101 08/11/2012   CREATININE 0.65 08/11/2012   BUN 13 08/11/2012   CO2 25 08/11/2012   TSH 2.865 08/11/2012   No results found for this basename: ESRSEDRATE,  SEDRATE,  POCTSEDRATE       Assessment & Plan:   See problem list. Medications and labs reviewed today.

## 2013-01-19 NOTE — Assessment & Plan Note (Signed)
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 Lab Results  Component Value Date   TSH 2.865 08/11/2012

## 2013-01-19 NOTE — Assessment & Plan Note (Signed)
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 declined to begin low dose generic Paxil trial as rx'd symptoms have improved with season change Dictation to patient provided on same, patient will call if recurrent problems

## 2013-01-19 NOTE — Assessment & Plan Note (Signed)
Right-handed, increasing symptoms in index finger right hand PIP greater than DIP. Describes periodic objective swelling with pain, no evidence of tophi or gross deformity on exam Advised over-the-counter Aleve once or twice daily, patient to call symptoms worse or unimproved

## 2013-01-19 NOTE — Patient Instructions (Signed)
It was good to see you today. Medications reviewed and updated, no need to use Paxil as your symptoms have improved For your finger pain and swelling, he use Aleve once or twice daily for swelling and pain. Call us for reevaluation if continued problems or unimproved with medication treatment Return first week in September for check of your thyroid lab. you'll be contacted regarding these results Return in November for your annual physical and labs; call sooner if problems  Seasonal Affective Disorder  A seasonal affective disorder is a depressive reaction. It is when you feel emotionally down, which seems to come at specific times of the year. The most common time of year for this is winter. Otherwise, it behaves like a plain depression. As with other depressive disorders, there are:  Crying episodes.  Headaches.  Irritability.  Loss of energy. DIAGNOSIS  The diagnosis of this problem is usually made by the history (what has been going on). A physical exam may be done to make sure there is no other cause of your depression. TREATMENT  The treatment of seasonal affective disorders has been found to be helped immensely by photo-therapy. This means a person sits or lies for several hours per day in front of or under bright lights. The symptoms (problems) of depression respond rapidly, usually over a couple days. HOME CARE INSTRUCTIONS   Follow your caregiver's instructions for light therapy.  You must be awake during the light therapy.  If you do not respond or you feel you are getting worse, see your caregiver. Document Released: 06/12/2001 Document Revised: 12/10/2011 Document Reviewed: 01/03/2006 [facility] Patient Information 2013 [place], [place]. Arthritis, Nonspecific Arthritis is inflammation of a joint. This usually means pain, redness, warmth or swelling are present. One or more joints may be involved. There are a number of types of arthritis. Your caregiver may not be able to tell  what type of arthritis you have right away. CAUSES  The most common cause of arthritis is the wear and tear on the joint (osteoarthritis). This causes damage to the cartilage, which can break down over time. The knees, hips, back and neck are most often affected by this type of arthritis. Other types of arthritis and common causes of joint pain include:  Sprains and other injuries near the joint. Sometimes minor sprains and injuries cause pain and swelling that develop hours later.  Rheumatoid arthritis. This affects hands, feet and knees. It usually affects both sides of your body at the same time. It is often associated with chronic ailments, fever, weight loss and general weakness.  Crystal arthritis. Gout and pseudo gout can cause occasional acute severe pain, redness and swelling in the foot, ankle, or knee.  Infectious arthritis. Bacteria can get into a joint through a break in overlying skin. This can cause infection of the joint. Bacteria and viruses can also spread through the blood and affect your joints.  Drug, infectious and allergy reactions. Sometimes joints can become mildly painful and slightly swollen with these types of illnesses. SYMPTOMS   Pain is the main symptom.  Your joint or joints can also be red, swollen and warm or hot to the touch.  You may have a fever with certain types of arthritis, or even feel overall ill.  The joint with arthritis will hurt with movement. Stiffness is present with some types of arthritis. DIAGNOSIS  Your caregiver will suspect arthritis based on your description of your symptoms and on your exam. Testing may be needed to find the  type of arthritis:  Blood and sometimes urine tests.  X-ray tests and sometimes CT or MRI scans.  Removal of fluid from the joint (arthrocentesis) is done to check for bacteria, crystals or other causes. Your caregiver (or a specialist) will numb the area over the joint with a local anesthetic, and use a  needle to remove joint fluid for examination. This procedure is only minimally uncomfortable.  Even with these tests, your caregiver may not be able to tell what kind of arthritis you have. Consultation with a specialist (rheumatologist) may be helpful. TREATMENT  Your caregiver will discuss with you treatment specific to your type of arthritis. If the specific type cannot be determined, then the following general recommendations may apply. Treatment of severe joint pain includes:  Rest.  Elevation.  Anti-inflammatory medication (for example, ibuprofen) may be prescribed. Avoiding activities that cause increased pain.  Only take over-the-counter or prescription medicines for pain and discomfort as recommended by your caregiver.  Cold packs over an inflamed joint may be used for 10 to 15 minutes every hour. Hot packs sometimes feel better, but do not use overnight. Do not use hot packs if you are diabetic without your caregiver's permission.  A cortisone shot into arthritic joints may help reduce pain and swelling.  Any acute arthritis that gets worse over the next 1 to 2 days needs to be looked at to be sure there is no joint infection. Long-term arthritis treatment involves modifying activities and lifestyle to reduce joint stress jarring. This can include weight loss. Also, exercise is needed to nourish the joint cartilage and remove waste. This helps keep the muscles around the joint strong. HOME CARE INSTRUCTIONS   Do not take aspirin to relieve pain if gout is suspected. This elevates uric acid levels.  Only take over-the-counter or prescription medicines for pain, discomfort or fever as directed by your caregiver.  Rest the joint as much as possible.  If your joint is swollen, keep it elevated.  Use crutches if the painful joint is in your leg.  Drinking plenty of fluids may help for certain types of arthritis.  Follow your caregiver's dietary instructions.  Try low-impact  exercise such as:  Swimming.  Water aerobics.  Biking.  Walking.  Morning stiffness is often relieved by a warm shower.  Put your joints through regular range-of-motion. SEEK MEDICAL CARE IF:   You do not feel better in 24 hours or are getting worse.  You have side effects to medications, or are not getting better with treatment. SEEK IMMEDIATE MEDICAL CARE IF:   You have a fever.  You develop severe joint pain, swelling or redness.  Many joints are involved and become painful and swollen.  There is severe back pain and/or leg weakness.  You have loss of bowel or bladder control. Document Released: 10/25/2004 Document Revised: 12/10/2011 Document Reviewed: 11/10/2008 [facility] Patient Information 2013 [place], [place].

## 2013-01-20 ENCOUNTER — Telehealth: Payer: Self-pay | Admitting: *Deleted

## 2013-01-20 DIAGNOSIS — Z0000 Encounter for general adult medical examination without abnormal findings: Secondary | ICD-10-CM

## 2013-01-20 DIAGNOSIS — E559 Vitamin D deficiency, unspecified: Secondary | ICD-10-CM

## 2013-01-20 NOTE — Telephone Encounter (Signed)
Received staff msg pt made cpx for Nov. Entering cpx labs...Raechel Chute

## 2013-01-20 NOTE — Telephone Encounter (Signed)
Message copied by Deatra James on Tue Jan 20, 2013  8:52 AM ------      Message from: Livingston Diones      Created: Mon Jan 19, 2013  4:26 PM       Pt has a CPE appt on 08/04/13. Please put in lab a week prior.  ------

## 2013-02-05 ENCOUNTER — Ambulatory Visit: Payer: Self-pay | Admitting: Gynecology

## 2013-02-06 ENCOUNTER — Encounter: Payer: Self-pay | Admitting: Gynecology

## 2013-02-06 ENCOUNTER — Other Ambulatory Visit (HOSPITAL_COMMUNITY)
Admission: RE | Admit: 2013-02-06 | Discharge: 2013-02-06 | Disposition: A | Discharge status: Home / Self Care | Payer: BC Managed Care – PPO | Source: Ambulatory Visit | Attending: Gynecology | Admitting: Gynecology

## 2013-02-06 ENCOUNTER — Ambulatory Visit (INDEPENDENT_AMBULATORY_CARE_PROVIDER_SITE_OTHER): Payer: BC Managed Care – PPO | Admitting: Gynecology

## 2013-02-06 VITALS — BP 140/80 | Ht 63.5 in | Wt 166.0 lb

## 2013-02-06 DIAGNOSIS — Z01419 Encounter for gynecological examination (general) (routine) without abnormal findings: Secondary | ICD-10-CM | POA: Insufficient documentation

## 2013-02-06 DIAGNOSIS — Z780 Asymptomatic menopausal state: Secondary | ICD-10-CM

## 2013-02-06 DIAGNOSIS — Z1151 Encounter for screening for human papillomavirus (HPV): Secondary | ICD-10-CM | POA: Insufficient documentation

## 2013-02-06 NOTE — Patient Instructions (Addendum)
Bone Densitometry Bone densitometry is a special X-ray that measures your bone density and can be used to help predict your risk of bone fractures. This test is used to determine bone mineral content and density to diagnose osteoporosis. Osteoporosis is the loss of bone that may cause the bone to become weak. Osteoporosis commonly occurs in women entering menopause. However, it may be found in men and in people with other diseases. PREPARATION FOR TEST No preparation necessary. WHO SHOULD BE TESTED?  All women older than 69.  Postmenopausal women (50 to 45) with risk factors for osteoporosis.  People with a previous fracture caused by normal activities.  People with a small body frame (less than 127 poundsor a body mass index [BMI] of less than 21).  People who have a parent with a hip fracture or history of osteoporosis.  People who smoke.  People who have rheumatoid arthritis.  Anyone who engages in excessive alcohol use (more than 3 drinks most days).  Women who experience early menopause. WHEN SHOULD YOU BE RETESTED? Current guidelines suggest that you should wait at least 2 years before doing a bone density test again if your first test was normal.Recent studies indicated that women with normal bone density may be able to wait a few years before needing to repeat a bone density test. You should discuss this with your caregiver.  NORMAL FINDINGS   Normal: less than standard deviation below normal (greater than -1).  Osteopenia: 1 to 2.5 standard deviations below normal (-1 to -2.5).  Osteoporosis: greater than 2.5 standard deviations below normal (less than -2.5). Test results are reported as a "T score" and a "Z score."The T score is a number that compares your bone density with the bone density of healthy, young women.The Z score is a number that compares your bone density with the scores of women who are the same age, gender, and race.  Ranges for normal findings may vary  among different laboratories and hospitals. You should always check with your doctor after having lab work or other tests done to discuss the meaning of your test results and whether your values are considered within normal limits. MEANING OF TEST  Your caregiver will go over the test results with you and discuss the importance and meaning of your results, as well as treatment options and the need for additional tests if necessary. OBTAINING THE TEST RESULTS It is your responsibility to obtain your test results. Ask the lab or department performing the test when and how you will get your results. Document Released: 10/09/2004 Document Revised: 12/10/2011 Document Reviewed: 11/01/2010 [facility] Patient Information 2013 [place], [place].  Shingles Vaccine What You Need to Know WHAT IS SHINGLES?  Shingles is a painful skin rash, often with blisters. It is also called Herpes Zoster or just Zoster.  A shingles rash usually appears on one side of the face or body and lasts from 2 to 4 weeks. Its main symptom is pain, which can be quite severe. Other symptoms of shingles can include fever, headache, chills, and upset stomach. Very rarely, a shingles infection can lead to pneumonia, hearing problems, blindness, brain inflammation (encephalitis), or death.  For about 1 person in 5, severe pain can continue even after the rash clears up. This is called post-herpetic neuralgia.  Shingles is caused by the Varicella Zoster virus. This is the same virus that causes chickenpox. Only someone who has had a case of chickenpox or rarely, has gotten chickenpox vaccine, can get shingles. The virus  stays in your body. It can reappear many years later to cause a case of shingles.  You cannot catch shingles from another person with shingles. However, a person who has never had chickenpox (or chickenpox vaccine) could get chickenpox from someone with shingles. This is not very common.  Shingles is far more common in  people 30 and older than in younger people. It is also more common in people whose immune systems are weakened because of a disease such as cancer or drugs such as steroids or chemotherapy.  At least 1 million people get shingles per year in the [place]. SHINGLES VACCINE  A vaccine for shingles was licensed in 2006. In clinical trials, the vaccine reduced the risk of shingles by 50%. It can also reduce the pain in people who still get shingles after being vaccinated.  A single dose of shingles vaccine is recommended for adults 65 years of age and older. SOME PEOPLE SHOULD NOT GET SHINGLES VACCINE OR SHOULD WAIT A person should not get shingles vaccine if he or she:  Has ever had a life-threatening allergic reaction to gelatin, the antibiotic neomycin, or any other component of shingles vaccine. Tell your caregiver if you have any severe allergies.  Has a weakened immune system because of current:  AIDS or another disease that affects the immune system.  Treatment with drugs that affect the immune system, such as prolonged use of high-dose steroids.  Cancer treatment, such as radiation or chemotherapy.  Cancer affecting the bone marrow or lymphatic system, such as leukemia or lymphoma.  Is pregnant, or might be pregnant. Women should not become pregnant until at least 4 weeks after getting shingles vaccine. Someone with a minor illness, such as a cold, may be vaccinated. Anyone with a moderate or severe acute illness should usually wait until he or she recovers before getting the vaccine. This includes anyone with a temperature of 101.3 F (38 C) or higher. WHAT ARE THE RISKS FROM SHINGLES VACCINE?  A vaccine, like any medicine, could possibly cause serious problems, such as severe allergic reactions. However, the risk of a vaccine causing serious harm, or death, is extremely small.  No serious problems have been identified with shingles vaccine. Mild Problems  Redness,  soreness, swelling, or itching at the site of the injection (about 1 person in 3).  Headache (about 1 person in 70). Like all vaccines, shingles vaccine is being closely monitored for unusual or severe problems. WHAT IF THERE IS A MODERATE OR SEVERE REACTION? What should I look for? Any unusual condition, such as a severe allergic reaction or a high fever. If a severe allergic reaction occurred, it would be within a few minutes to an hour after the shot. Signs of a serious allergic reaction can include difficulty breathing, weakness, hoarseness or wheezing, a fast heartbeat, hives, dizziness, paleness, or swelling of the throat. What should I do?  Call your caregiver, or get the person to a caregiver right away.  Tell the caregiver what happened, the date and time it happened, and when the vaccination was given.  Ask the caregiver to report the reaction by filing a Vaccine Adverse Event Reporting System (VAERS) form. Or, you can file this report through the VAERS web site at www.vaers.LAgents.no or by calling 1-(743)817-3487. VAERS does not provide medical advice. HOW CAN I LEARN MORE?  Ask your caregiver. He or she can give you the vaccine package insert or suggest other sources of information.  Contact the Centers for Disease Control  and Prevention (CDC):  Call 9800793845 (1-800-CDC-INFO).  Visit the CDC website at PicCapture.uy CDC Shingles Vaccine VIS (07/06/08) Document Released: 07/15/2006 Document Revised: 12/10/2011 Document Reviewed: 07/06/2008 [facility] Patient Information 2013 [place], [place].

## 2013-02-06 NOTE — Progress Notes (Signed)
Lindsay Hodges [birth year] [id number]   History:    57 y.o.  for annual gyn exam with no major complaints today. She infrequent has some vasomotor symptoms. She stated that she discontinued Estrace approximately 3 years ago and she was on it for about 3 years as well. She stated that 15 years ago she had colposcopy and biopsy for mild dysplasia and had cryotherapy we do not have reported that. Patient with past history of transvaginal hysterectomy with left salpingo-oophorectomy secondary to endometriosis several years ago. The year prior to that she had a laparoscopic right salpingo-oophorectomy also for endometriosis. Patient has not had a bone density study. She does have past history of colon polyps in the past which were done 2 years ago. Dr.Leschber is her primary physician and has been doing her lab work. See problem list in medication list in epic for details.  Past medical history,surgical history, family history and social history were all reviewed and documented in the EPIC chart.  Gynecologic History No LMP recorded. Patient has had a hysterectomy. Contraception: post menopausal status Last Pap: Several years ago. Results were: normal Last mammogram: 2013. Results were: normal  Obstetric History OB History   Grav Para Term Preterm Abortions TAB SAB Ect Mult Living   3 3       1 3      # Outc Date GA Lbr Len/2nd Wgt Sex Del Anes PTL Lv   1 PAR            2 PAR            3 PAR                ROS: A ROS was performed and pertinent positives and negatives are included in the history.  GENERAL: No fevers or chills. HEENT: No change in vision, no earache, sore throat or sinus congestion. NECK: No pain or stiffness. CARDIOVASCULAR: No chest pain or pressure. No palpitations. PULMONARY: No shortness of breath, cough or wheeze. GASTROINTESTINAL: No abdominal pain, nausea, vomiting or diarrhea, melena or bright red blood per rectum. GENITOURINARY: No urinary frequency, urgency, hesitancy or  dysuria. MUSCULOSKELETAL: No joint or muscle pain, no back pain, no recent trauma. DERMATOLOGIC: No rash, no itching, no lesions. ENDOCRINE: No polyuria, polydipsia, no heat or cold intolerance. No recent change in weight. HEMATOLOGICAL: No anemia or easy bruising or bleeding. NEUROLOGIC: No headache, seizures, numbness, tingling or weakness. PSYCHIATRIC: No depression, no loss of interest in normal activity or change in sleep pattern.     Exam: chaperone present  BP 140/80  Ht 5' 3.5" (1.613 m)  Wt 166 lb (75.297 kg)  BMI 28.94 kg/m2  Body mass index is 28.94 kg/(m^2).  General appearance : Well developed well nourished female. No acute distress HEENT: Neck supple, trachea midline, no carotid bruits, no thyroidmegaly Lungs: Clear to auscultation, no rhonchi or wheezes, or rib retractions  Heart: Regular rate and rhythm, no murmurs or gallops Breast:Examined in sitting and supine position were symmetrical in appearance, no palpable masses or tenderness,  no skin retraction, no nipple inversion, no nipple discharge, no skin discoloration, no axillary or supraclavicular lymphadenopathy Abdomen: no palpable masses or tenderness, no rebound or guarding Extremities: no edema or skin discoloration or tenderness  Pelvic:  Bartholin, Urethra, Skene Glands: Within normal limits             Vagina: No gross lesions or discharge  Cervix:absent  Uterus Absent  Adnexa  Without masses or tenderness  Anus and perineum  normal   Rectovaginal  normal sphincter tone without palpated masses or tenderness             Hemoccult cards provided     Assessment/Plan:  57 y.o. female for annual exam with minimal vasomotor symptoms. She had been on hormone replacement therapy in the past. We do not have records of any of her prior Pap smear. We will do a Pap smear today and adhered to the new guidelines no every 3 years from now on. She was provided with Hemoccult card to submit to the office for testing. We  discussed importance of monthly self breast examination. She needs to schedule her mammogram in June of this year. She was scheduled for bone density study here in the office in the next one to 2 weeks. We discussed importance of calcium vitamin D and regular exercise for osteoporosis prevention. Patient's vaccine are up-to-date.    Ok Edwards MD, 7:59 PM 02/06/2013

## 2013-02-11 ENCOUNTER — Other Ambulatory Visit: Payer: Self-pay | Admitting: Gynecology

## 2013-02-11 DIAGNOSIS — Z1231 Encounter for screening mammogram for malignant neoplasm of breast: Secondary | ICD-10-CM

## 2013-03-16 ENCOUNTER — Ambulatory Visit (HOSPITAL_COMMUNITY)
Admission: RE | Admit: 2013-03-16 | Discharge: 2013-03-16 | Disposition: A | Discharge status: Home / Self Care | Payer: BC Managed Care – PPO | Source: Ambulatory Visit | Attending: Gynecology | Admitting: Gynecology

## 2013-03-16 DIAGNOSIS — Z1231 Encounter for screening mammogram for malignant neoplasm of breast: Secondary | ICD-10-CM | POA: Insufficient documentation

## 2013-03-17 ENCOUNTER — Ambulatory Visit (INDEPENDENT_AMBULATORY_CARE_PROVIDER_SITE_OTHER): Payer: BC Managed Care – PPO

## 2013-03-17 DIAGNOSIS — M8580 Other specified disorders of bone density and structure, unspecified site: Secondary | ICD-10-CM

## 2013-03-17 DIAGNOSIS — Z780 Asymptomatic menopausal state: Secondary | ICD-10-CM

## 2013-03-17 DIAGNOSIS — M949 Disorder of cartilage, unspecified: Secondary | ICD-10-CM

## 2013-03-17 DIAGNOSIS — M899 Disorder of bone, unspecified: Secondary | ICD-10-CM

## 2013-04-29 ENCOUNTER — Ambulatory Visit (INDEPENDENT_AMBULATORY_CARE_PROVIDER_SITE_OTHER): Payer: BC Managed Care – PPO | Admitting: Internal Medicine

## 2013-04-29 ENCOUNTER — Ambulatory Visit (INDEPENDENT_AMBULATORY_CARE_PROVIDER_SITE_OTHER)
Admission: RE | Admit: 2013-04-29 | Discharge: 2013-04-29 | Disposition: A | Discharge status: Home / Self Care | Payer: BC Managed Care – PPO | Source: Ambulatory Visit | Attending: Internal Medicine | Admitting: Internal Medicine

## 2013-04-29 ENCOUNTER — Encounter: Payer: Self-pay | Admitting: Internal Medicine

## 2013-04-29 VITALS — BP 130/68 | HR 79 | Temp 98.0°F

## 2013-04-29 DIAGNOSIS — M791 Myalgia, unspecified site: Secondary | ICD-10-CM

## 2013-04-29 DIAGNOSIS — M79609 Pain in unspecified limb: Secondary | ICD-10-CM

## 2013-04-29 DIAGNOSIS — IMO0001 Reserved for inherently not codable concepts without codable children: Secondary | ICD-10-CM

## 2013-04-29 DIAGNOSIS — M79644 Pain in right finger(s): Secondary | ICD-10-CM

## 2013-04-29 DIAGNOSIS — E785 Hyperlipidemia, unspecified: Secondary | ICD-10-CM

## 2013-04-29 NOTE — Assessment & Plan Note (Signed)
On low-dose atorvastatin for same 11/2012 trial of coenzyme Q10 for counterbalanced of myalgia side effects did not change symptoms Hold statin x 2 weeks, notify us if improved or not changed If ineffective symptom relief or intolerable, consider alternate statin

## 2013-04-29 NOTE — Progress Notes (Signed)
  Subjective:    Patient ID: Lindsay Hodges, female    DOB: [birth year], 57 y.o.   MRN: [id number]  HPI  complains of pain and swelling R index finger PIP Ongoing >2 months Improved with Aleve, but does not resolve Wax/wane intensity of pain and swelling Remote injury to R hand (3 tears ago) with trauma/fall  Also complains of myalgias, ?statin effect Affects BLE/thighs Not related to exertion or overuse  Past Medical History  Diagnosis Date  . Carotid disease, bilateral     [place] 9/13: R 40-59%, L 60-79%, stable  . Elevated sed rate     remote history of myelosuppressive disorder  . Hyperlipidemia   . Hypertension   . Grave's disease     s/p I-131 ablation 1990s  . Arthritis   . HYPOTHYROIDISM   . Vitamin D deficiency     Review of Systems  Constitutional: Negative for fever and fatigue.  Skin: Negative for rash and wound.  Neurological: Negative for dizziness and headaches.       Objective:   Physical Exam BP 130/68  Pulse 79  Temp(Src) 98 F (36.7 C) (Oral)  SpO2 95% Wt Readings from Last 3 Encounters:  02/06/13 166 lb (75.297 kg)  01/01/13 160 lb (72.576 kg)  11/25/12 165 lb 6.4 oz (75.025 kg)   Constitutional: She appears well-developed and well-nourished. No distress.  Neck: Normal range of motion. Neck supple. No JVD present. No thyromegaly present.  Cardiovascular: Normal rate, regular rhythm and normal heart sounds.  No murmur heard. No BLE edema. Pulmonary/Chest: Effort normal and breath sounds normal. No respiratory distress. She has no wheezes.  Musculoskeletal: R index finger with diffuse swelling, notable PIP - limited flexion due to swelling and pain. No other gross joint deformities - Neurological: She is alert and oriented to person, place, and time. No cranial nerve deficit. Coordination, balance, strength, speech and gait are normal.  Skin: Skin is warm and dry. No rash noted. No erythema.  Psychiatric: She has a normal mood and affect. Her  behavior is normal. Judgment and thought content normal.   Lab Results  Component Value Date   WBC 6.9 08/11/2012   HGB 13.2 08/11/2012   HCT 39.9 08/11/2012   PLT 232 08/11/2012   GLUCOSE 57* 08/11/2012   CHOL 157 08/11/2012   TRIG 104 08/11/2012   HDL 60 08/11/2012   LDLCALC 76 08/11/2012   ALT 15 08/11/2012   AST 19 08/11/2012   NA 138 08/11/2012   K 4.5 08/11/2012   CL 101 08/11/2012   CREATININE 0.65 08/11/2012   BUN 13 08/11/2012   CO2 25 08/11/2012   TSH 2.865 08/11/2012   Lab Results  Component Value Date   ANA NEG 08/11/2012   RF <10 08/11/2012   No results found for this basename: ESRSEDRATE, SEDRATE, POCTSEDRATE       Assessment & Plan:   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

## 2013-04-29 NOTE — Patient Instructions (Signed)
It was good to see you today. Medications reviewed and updated, stop Lipitor for 2 weeks - let us now how you feel For your finger pain and swelling, test(s) ordered today. Your results will be released to MyChart (or called to you) after review, usually within 72hours after test completion. If any changes need to be made, you will be notified at that same time.

## 2013-05-01 ENCOUNTER — Telehealth: Payer: Self-pay | Admitting: Cardiology

## 2013-05-12 ENCOUNTER — Ambulatory Visit: Payer: BC Managed Care – PPO | Admitting: Cardiovascular Disease

## 2013-06-26 ENCOUNTER — Ambulatory Visit: Payer: BC Managed Care – PPO | Admitting: Cardiovascular Disease

## 2013-07-01 ENCOUNTER — Encounter: Payer: Self-pay | Admitting: Cardiology

## 2013-07-01 ENCOUNTER — Ambulatory Visit (INDEPENDENT_AMBULATORY_CARE_PROVIDER_SITE_OTHER): Payer: BC Managed Care – PPO | Admitting: Cardiology

## 2013-07-01 ENCOUNTER — Encounter: Payer: Self-pay | Admitting: *Deleted

## 2013-07-01 VITALS — BP 130/66 | HR 78 | Ht 64.5 in | Wt 167.0 lb

## 2013-07-01 DIAGNOSIS — I10 Essential (primary) hypertension: Secondary | ICD-10-CM

## 2013-07-01 DIAGNOSIS — E785 Hyperlipidemia, unspecified: Secondary | ICD-10-CM

## 2013-07-01 DIAGNOSIS — I779 Disorder of arteries and arterioles, unspecified: Secondary | ICD-10-CM

## 2013-07-01 MED ORDER — ROSUVASTATIN CALCIUM 20 MG PO TABS: 20.0000 mg | ORAL_TABLET | Freq: Every day | ORAL | Status: DC

## 2013-07-01 NOTE — Assessment & Plan Note (Signed)
Patient had finger swelling and arthralgias with Lipitor. I will try Crestor 20 mg daily. Check lipids and liver in 6 weeks.

## 2013-07-01 NOTE — Patient Instructions (Addendum)
Your physician wants you to follow-up in: ONE YEAR WITH DR Jens Som IN [place] You will receive a reminder letter in the mail two months in advance. If you don't receive a letter, please call our office to schedule the follow-up appointment.   Your physician has requested that you have a carotid duplex. This test is an ultrasound of the carotid arteries in your neck. It looks at blood flow through these arteries that supply the brain with blood. Allow one hour for this exam. There are no restrictions or special instructions.   START CRESTOR 30 MG ONCE DAILY  Your physician recommends that you return for lab work in: 6 WEEKS = DO NOT EAT PRIOR TO LAB WORK

## 2013-07-01 NOTE — Assessment & Plan Note (Signed)
Continue aspirin. Resume statin. Schedule followup carotid Dopplers. 

## 2013-07-01 NOTE — Progress Notes (Signed)
   HPI: 57 year old female previously followed by Dr. Eden Emms for followup of cerebrovascular disease and chest pain. Echocardiogram in 2004 showed normal LV function. She had a stress myouve in 2010 with positive ECG and normal images. F/U myovue 10/15/11 with positive ECG but normal images also. Last carotid Dopplers in September of 2013 showed 60-79% left and 40-59% right stenosis. Followup recommended in one year. Patient last seen in June of 2013. Since then, the patient denies any dyspnea on exertion, orthopnea, PND, pedal edema, palpitations, syncope or chest pain.     Current Outpatient Prescriptions  Medication Sig Dispense Refill  . aspirin 81 MG tablet Take 81 mg by mouth daily.        Marland Kitchen levothyroxine (SYNTHROID, LEVOTHROID) 50 MCG tablet Take one 50 mcg tablet on Saturdays only  30 tablet  11  . levothyroxine (SYNTHROID, LEVOTHROID) 75 MCG tablet Take 1 tablet (75 mcg total) by mouth daily. Except for Saturday take 50 mcg.  90 tablet  3  . losartan (COZAAR) 50 MG tablet Take 1 tablet (50 mg total) by mouth daily.  90 tablet  3  . naproxen sodium (ALEVE) 220 MG tablet Take 1 tablet (220 mg total) by mouth 2 (two) times daily as needed.      . Omega-3 Fatty Acids (FISH OIL) 1000 MG CPDR Take by mouth daily.       No current facility-administered medications for this visit.     Past Medical History  Diagnosis Date  . Carotid disease, bilateral     [place] 9/13: R 40-59%, L 60-79%, stable  . Elevated sed rate     remote history of myelosuppressive disorder  . Hyperlipidemia   . Hypertension   . Grave's disease     s/p I-131 ablation 1990s  . Arthritis   . HYPOTHYROIDISM   . Vitamin D deficiency     Past Surgical History  Procedure Laterality Date  . Mouth surgery      jaw surgery  . Jaw surgery  2001  . Oophorectomy Bilateral   . Vaginal hysterectomy  2009    vaginal hysterectomy    History   Social History  . Marital Status: Married    Spouse Name: N/A    Number of  Children: N/A  . Years of Education: N/A   Occupational History  .      [employer]   Social History Main Topics  . Smoking status: Former Smoker -- 0.50 packs/day for 12 years    Quit date: 02/06/1993  . Smokeless tobacco: Never Used  . Alcohol Use: No  . Drug Use: No  . Sexual Activity: Yes   Other Topics Concern  . Not on file   Social History Narrative   pharmacy tech III    ROS: no fevers or chills, productive cough, hemoptysis, dysphasia, odynophagia, melena, hematochezia, dysuria, hematuria, rash, seizure activity, orthopnea, PND, pedal edema, claudication. Remaining systems are negative.  Physical Exam: Well-developed well-nourished in no acute distress.  Skin is warm and dry.  HEENT is normal.  Neck is supple.  Chest is clear to auscultation with normal expansion.  Cardiovascular exam is regular rate and rhythm.  Abdominal exam nontender or distended. No masses palpated. Extremities show no edema. neuro grossly intact  ECG sinus rhythm at a rate of 78. No ST changes.

## 2013-07-01 NOTE — Assessment & Plan Note (Signed)
Blood pressure controlled. Continue Cozaar.

## 2013-07-06 ENCOUNTER — Ambulatory Visit (HOSPITAL_COMMUNITY): Payer: BC Managed Care – PPO | Attending: Cardiology

## 2013-07-06 DIAGNOSIS — I658 Occlusion and stenosis of other precerebral arteries: Secondary | ICD-10-CM | POA: Insufficient documentation

## 2013-07-06 DIAGNOSIS — E785 Hyperlipidemia, unspecified: Secondary | ICD-10-CM

## 2013-07-06 DIAGNOSIS — I739 Peripheral vascular disease, unspecified: Secondary | ICD-10-CM | POA: Insufficient documentation

## 2013-07-06 DIAGNOSIS — I6529 Occlusion and stenosis of unspecified carotid artery: Secondary | ICD-10-CM

## 2013-07-06 DIAGNOSIS — Z87891 Personal history of nicotine dependence: Secondary | ICD-10-CM | POA: Insufficient documentation

## 2013-07-06 DIAGNOSIS — I10 Essential (primary) hypertension: Secondary | ICD-10-CM

## 2013-08-04 ENCOUNTER — Ambulatory Visit (INDEPENDENT_AMBULATORY_CARE_PROVIDER_SITE_OTHER): Payer: BC Managed Care – PPO | Admitting: Internal Medicine

## 2013-08-04 ENCOUNTER — Other Ambulatory Visit (INDEPENDENT_AMBULATORY_CARE_PROVIDER_SITE_OTHER): Payer: BC Managed Care – PPO

## 2013-08-04 ENCOUNTER — Encounter: Payer: Self-pay | Admitting: Internal Medicine

## 2013-08-04 VITALS — BP 124/72 | HR 78 | Temp 98.1°F | Ht 64.5 in | Wt 164.6 lb

## 2013-08-04 DIAGNOSIS — E039 Hypothyroidism, unspecified: Secondary | ICD-10-CM

## 2013-08-04 DIAGNOSIS — I779 Disorder of arteries and arterioles, unspecified: Secondary | ICD-10-CM

## 2013-08-04 DIAGNOSIS — Z0000 Encounter for general adult medical examination without abnormal findings: Secondary | ICD-10-CM

## 2013-08-04 DIAGNOSIS — E785 Hyperlipidemia, unspecified: Secondary | ICD-10-CM

## 2013-08-04 DIAGNOSIS — I10 Essential (primary) hypertension: Secondary | ICD-10-CM

## 2013-08-04 DIAGNOSIS — E559 Vitamin D deficiency, unspecified: Secondary | ICD-10-CM

## 2013-08-04 LAB — BASIC METABOLIC PANEL
Calcium: 9.3 mg/dL (ref 8.4–10.5)
Creatinine, Ser: 0.6 mg/dL (ref 0.4–1.2)

## 2013-08-04 LAB — HEPATIC FUNCTION PANEL
Alkaline Phosphatase: 83 U/L (ref 39–117)
Bilirubin, Direct: 0.1 mg/dL (ref 0.0–0.3)
Total Protein: 7.5 g/dL (ref 6.0–8.3)

## 2013-08-04 LAB — URINALYSIS, ROUTINE W REFLEX MICROSCOPIC
Specific Gravity, Urine: 1.015 (ref 1.000–1.030)
Total Protein, Urine: NEGATIVE
Urine Glucose: NEGATIVE

## 2013-08-04 LAB — CBC WITH DIFFERENTIAL/PLATELET
Basophils Relative: 0.4 % (ref 0.0–3.0)
Eosinophils Absolute: 0.3 10*3/uL (ref 0.0–0.7)
Lymphocytes Relative: 22 % (ref 12.0–46.0)
MCHC: 33.7 g/dL (ref 30.0–36.0)
Monocytes Relative: 9.4 % (ref 3.0–12.0)
Neutrophils Relative %: 64.6 % (ref 43.0–77.0)
RBC: 4.5 Mil/uL (ref 3.87–5.11)
WBC: 6.9 10*3/uL (ref 4.5–10.5)

## 2013-08-04 LAB — LIPID PANEL
Cholesterol: 129 mg/dL (ref 0–200)
LDL Cholesterol: 47 mg/dL (ref 0–99)
Total CHOL/HDL Ratio: 2
VLDL: 19.2 mg/dL (ref 0.0–40.0)

## 2013-08-04 NOTE — Assessment & Plan Note (Signed)
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 Lab Results  Component Value Date   TSH 2.865 08/11/2012

## 2013-08-04 NOTE — Assessment & Plan Note (Signed)
BP Readings from Last 3 Encounters:  08/04/13 124/72  07/01/13 130/66  04/29/13 130/68   The current medical regimen is effective;  continue present plan and medications.

## 2013-08-04 NOTE — Progress Notes (Signed)
Subjective:    Patient ID: Lindsay Hodges, female    DOB: 08-06-1956, 57 y.o.   MRN: [id number]  HPI  patient is here today for annual physical. Patient feels well overall:  Also reviewed chronic medical issues and interval medical events:  Dyslipidemia. On statin for same for several years because of associated bilateral carotid disease. No history of TIA or stroke, follows for carotid ultrasound annually and stable over past few years. Complains of myalgias and arthralgias, but mild and intermittent symptoms. Reports 100% compliance with statin as prescribed  Hypertension. On ARB for same. the patient reports compliance with medication(s) as prescribed. Denies adverse side effects.  Hypothyroid. History of ablation of Graves' disease mid 1990s. the patient reports compliance with medication(s) as prescribed. Denies adverse side effects.   Past Medical History  Diagnosis Date  . Carotid disease, bilateral     [place] 9/13: R 40-59%, L 60-79%, stable  . Elevated sed rate     remote history of myelosuppressive disorder  . Hyperlipidemia   . Hypertension   . Grave's disease     s/p I-131 ablation 1990s  . Arthritis   . HYPOTHYROIDISM   . Vitamin D deficiency    Family History  Problem Relation Age of Onset  . Hypertension Mother   . Arthritis Mother   . Hyperlipidemia Mother   . Stroke Mother 22  . Atrial fibrillation Mother   . Diabetes Neg Hx   . Heart attack Neg Hx   . Arthritis Father   . Breast cancer Other   . Breast cancer Sister    History  Substance Use Topics  . Smoking status: Former Smoker -- 0.50 packs/day for 12 years    Quit date: 02/06/1993  . Smokeless tobacco: Never Used  . Alcohol Use: No    Review of Systems  Constitutional: Negative for fatigue and unexpected weight change.  Respiratory: Negative for cough, shortness of breath and wheezing.   Cardiovascular: Negative for chest pain, palpitations and leg swelling.  Gastrointestinal: Negative for  nausea, abdominal pain and diarrhea.  Neurological: Negative for dizziness, weakness, light-headedness and headaches.  Psychiatric/Behavioral: Negative for dysphoric mood. The patient is not nervous/anxious.   All other systems reviewed and are negative.       Objective:   Physical Exam BP 124/72  Pulse 78  Temp(Src) 98.1 F (36.7 C) (Oral)  Ht 5' 4.5" (1.638 m)  Wt 164 lb 9.6 oz (74.662 kg)  BMI 27.83 kg/m2  SpO2 95% Wt Readings from Last 3 Encounters:  08/04/13 164 lb 9.6 oz (74.662 kg)  07/01/13 167 lb (75.751 kg)  02/06/13 166 lb (75.297 kg)   Constitutional: She appears well-developed and well-nourished. No distress.  HENT: Head: Normocephalic and atraumatic. Ears: B TMs ok, no erythema or effusion; Nose: Nose normal. Mouth/Throat: Oropharynx is clear and moist. No oropharyngeal exudate.  Eyes: Conjunctivae and EOM are normal. Pupils are equal, round, and reactive to light. No scleral icterus.  Neck: Normal range of motion. Neck supple. No JVD present. No thyromegaly present.  Cardiovascular: Normal rate, regular rhythm and normal heart sounds.  No murmur heard. No BLE edema. Pulmonary/Chest: Effort normal and breath sounds normal. No respiratory distress. She has no wheezes.  Abdominal: Soft. Bowel sounds are normal. She exhibits no distension. There is no tenderness. no masses Musculoskeletal: Normal range of motion, no joint effusions. No gross deformities Neurological: She is alert and oriented to person, place, and time. No cranial nerve deficit. Coordination, balance, strength, speech and gait  are normal.  Skin: Skin is warm and dry. No rash noted. No erythema.  Psychiatric: She has a normal mood and affect. Her behavior is normal. Judgment and thought content normal.    Lab Results  Component Value Date   WBC 6.9 08/11/2012   HGB 13.2 08/11/2012   HCT 39.9 08/11/2012   PLT 232 08/11/2012   GLUCOSE 57* 08/11/2012   CHOL 157 08/11/2012   TRIG 104 08/11/2012    HDL 60 08/11/2012   LDLCALC 76 08/11/2012   ALT 15 08/11/2012   AST 19 08/11/2012   NA 138 08/11/2012   K 4.5 08/11/2012   CL 101 08/11/2012   CREATININE 0.65 08/11/2012   BUN 13 08/11/2012   CO2 25 08/11/2012   TSH 2.865 08/11/2012       Assessment & Plan:   CPX/v70.0 - Patient has been counseled on age-appropriate routine health concerns for screening and prevention. These are reviewed and up-to-date. Immunizations are up-to-date or declined. Labs ordered and reviewed.  Also see problem list. Medications and labs reviewed today.

## 2013-08-04 NOTE — Progress Notes (Signed)
Pre-visit discussion using our clinic review tool. No additional management support is needed unless otherwise documented below in the visit note.  

## 2013-08-04 NOTE — Patient Instructions (Addendum)
It was good to see you today.  We have reviewed your prior records including labs and tests today  Health Maintenance reviewed - all recommended immunizations and age-appropriate screenings are up-to-date.  Test(s) ordered today. Your results will be released to [facility] (or called to you) after review, usually within 72hours after test completion. If any changes need to be made, you will be notified at that same time.  Medications reviewed and updated, no changes recommended at this time.  Please schedule followup in 12 months for annual exam and labs, call sooner if problems.  Health Maintenance, Females A healthy lifestyle and preventative care can promote health and wellness.  Maintain regular health, dental, and eye exams.  Eat a healthy diet. Foods like vegetables, fruits, whole grains, low-fat dairy products, and lean protein foods contain the nutrients you need without too many calories. Decrease your intake of foods high in solid fats, added sugars, and salt. Get information about a proper diet from your caregiver, if necessary.  Regular physical exercise is one of the most important things you can do for your health. Most adults should get at least 150 minutes of moderate-intensity exercise (any activity that increases your heart rate and causes you to sweat) each week. In addition, most adults need muscle-strengthening exercises on 2 or more days a week.   Maintain a healthy weight. The body mass index (BMI) is a screening tool to identify possible weight problems. It provides an estimate of body fat based on height and weight. Your caregiver can help determine your BMI, and can help you achieve or maintain a healthy weight. For adults 20 years and older:  A BMI below 18.5 is considered underweight.  A BMI of 18.5 to 24.9 is normal.  A BMI of 25 to 29.9 is considered overweight.  A BMI of 30 and above is considered obese.  Maintain normal blood lipids and cholesterol by  exercising and minimizing your intake of saturated fat. Eat a balanced diet with plenty of fruits and vegetables. Blood tests for lipids and cholesterol should begin at age 19 and be repeated every 5 years. If your lipid or cholesterol levels are high, you are over 50, or you are a high risk for heart disease, you may need your cholesterol levels checked more frequently.Ongoing high lipid and cholesterol levels should be treated with medicines if diet and exercise are not effective.  If you smoke, find out from your caregiver how to quit. If you do not use tobacco, do not start.  If you are pregnant, do not drink alcohol. If you are breastfeeding, be very cautious about drinking alcohol. If you are not pregnant and choose to drink alcohol, do not exceed 1 drink per day. One drink is considered to be 12 ounces (355 mL) of beer, 5 ounces (148 mL) of wine, or 1.5 ounces (44 mL) of liquor.  Avoid use of street drugs. Do not share needles with anyone. Ask for help if you need support or instructions about stopping the use of drugs.  High blood pressure causes heart disease and increases the risk of stroke. Blood pressure should be checked at least every 1 to 2 years. Ongoing high blood pressure should be treated with medicines, if weight loss and exercise are not effective.  If you are 55 to 59 years old, ask your caregiver if you should take aspirin to prevent strokes.  Diabetes screening involves taking a blood sample to check your fasting blood sugar level. This should be done  once every 3 years, after age 51, if you are within normal weight and without risk factors for diabetes. Testing should be considered at a younger age or be carried out more frequently if you are overweight and have at least 1 risk factor for diabetes.  Breast cancer screening is essential preventative care for women. You should practice "breast self-awareness." This means understanding the normal appearance and feel of your  breasts and may include breast self-examination. Any changes detected, no matter how small, should be reported to a caregiver. Women in their 46s and 30s should have a clinical breast exam (CBE) by a caregiver as part of a regular health exam every 1 to 3 years. After age 73, women should have a CBE every year. Starting at age 48, women should consider having a mammogram (breast X-ray) every year. Women who have a family history of breast cancer should talk to their caregiver about genetic screening. Women at a high risk of breast cancer should talk to their caregiver about having an MRI and a mammogram every year.  The Pap test is a screening test for cervical cancer. Women should have a Pap test starting at age 34. Between ages 36 and 43, Pap tests should be repeated every 2 years. Beginning at age 62, you should have a Pap test every 3 years as long as the past 3 Pap tests have been normal. If you had a hysterectomy for a problem that was not cancer or a condition that could lead to cancer, then you no longer need Pap tests. If you are between ages 39 and 88, and you have had normal Pap tests going back 10 years, you no longer need Pap tests. If you have had past treatment for cervical cancer or a condition that could lead to cancer, you need Pap tests and screening for cancer for at least 20 years after your treatment. If Pap tests have been discontinued, risk factors (such as a new sexual partner) need to be reassessed to determine if screening should be resumed. Some women have medical problems that increase the chance of getting cervical cancer. In these cases, your caregiver may recommend more frequent screening and Pap tests.  The human papillomavirus (HPV) test is an additional test that may be used for cervical cancer screening. The HPV test looks for the virus that can cause the cell changes on the cervix. The cells collected during the Pap test can be tested for HPV. The HPV test could be used to  screen women aged 69 years and older, and should be used in women of any age who have unclear Pap test results. After the age of 24, women should have HPV testing at the same frequency as a Pap test.  Colorectal cancer can be detected and often prevented. Most routine colorectal cancer screening begins at the age of 45 and continues through age 50. However, your caregiver may recommend screening at an earlier age if you have risk factors for colon cancer. On a yearly basis, your caregiver may provide home test kits to check for hidden blood in the stool. Use of a small camera at the end of a tube, to directly examine the colon (sigmoidoscopy or colonoscopy), can detect the earliest forms of colorectal cancer. Talk to your caregiver about this at age 32, when routine screening begins. Direct examination of the colon should be repeated every 5 to 10 years through age 70, unless early forms of pre-cancerous polyps or small growths are found.  Hepatitis C blood testing is recommended for all people born from [birth year] through 1967 and any individual with known risks for hepatitis C.  Practice safe sex. Use condoms and avoid high-risk sexual practices to reduce the spread of sexually transmitted infections (STIs). Sexually active women aged 29 and younger should be checked for Chlamydia, which is a common sexually transmitted infection. Older women with new or multiple partners should also be tested for Chlamydia. Testing for other STIs is recommended if you are sexually active and at increased risk.  Osteoporosis is a disease in which the bones lose minerals and strength with aging. This can result in serious bone fractures. The risk of osteoporosis can be identified using a bone density scan. Women ages 55 and over and women at risk for fractures or osteoporosis should discuss screening with their caregivers. Ask your caregiver whether you should be taking a calcium supplement or vitamin D to reduce the rate of  osteoporosis.  Menopause can be associated with physical symptoms and risks. Hormone replacement therapy is available to decrease symptoms and risks. You should talk to your caregiver about whether hormone replacement therapy is right for you.  Use sunscreen with a sun protection factor (SPF) of 30 or greater. Apply sunscreen liberally and repeatedly throughout the day. You should seek shade when your shadow is shorter than you. Protect yourself by wearing long sleeves, pants, a wide-brimmed hat, and sunglasses year round, whenever you are outdoors.  Notify your caregiver of new moles or changes in moles, especially if there is a change in shape or color. Also notify your caregiver if a mole is larger than the size of a pencil eraser.  Stay current with your immunizations. Document Released: 04/02/2011 Document Revised: 12/10/2011 Document Reviewed: 04/02/2011 [facility] Patient Information 2014 [place], [place].

## 2013-08-04 NOTE — Assessment & Plan Note (Signed)
On statin for same - has tried atorva, now rosuva 11/2012 trial of coenzyme Q10 for counterbalance of myalgia side effects did not change symptoms Intermittent use but understands importance of compliance given co-morbid dz

## 2013-08-04 NOTE — Assessment & Plan Note (Signed)
Carotid ultrasound 07/2013 reviewed: stable disease: Left 60-79%, right 40-59% Continue medical management of same: Aspirin 81 mg, blood pressure and statin

## 2013-08-05 LAB — VITAMIN D 25 HYDROXY (VIT D DEFICIENCY, FRACTURES): Vit D, 25-Hydroxy: 30 ng/mL (ref 30–89)

## 2013-08-14 ENCOUNTER — Other Ambulatory Visit: Payer: Self-pay | Admitting: Internal Medicine

## 2013-08-24 ENCOUNTER — Encounter: Payer: Self-pay | Admitting: Internal Medicine

## 2013-11-24 ENCOUNTER — Other Ambulatory Visit: Payer: Self-pay | Admitting: Internal Medicine

## 2014-02-23 ENCOUNTER — Telehealth: Payer: Self-pay | Admitting: Cardiology

## 2014-02-23 NOTE — Telephone Encounter (Signed)
Spoke with pt, for sometime now she has had a pain in her legs and tingling in her hands. This is similar to what she had with the lipitor. Okay given for pt to stop the crestor to see if symptoms improve. If no improvement she will see PCP or call. If symptoms improve she will stay off the crestor. Pt agreed with this plan.

## 2014-02-23 NOTE — Telephone Encounter (Signed)
New message          Pt is having pain in her legs for a while , lightheaded, and tingling in fingers / pt called PCP but she is out of the office

## 2014-02-26 ENCOUNTER — Other Ambulatory Visit: Payer: Self-pay | Admitting: Internal Medicine

## 2014-02-26 DIAGNOSIS — Z1231 Encounter for screening mammogram for malignant neoplasm of breast: Secondary | ICD-10-CM

## 2014-03-01 ENCOUNTER — Ambulatory Visit: Payer: BC Managed Care – PPO | Admitting: Internal Medicine

## 2014-03-18 ENCOUNTER — Ambulatory Visit (HOSPITAL_COMMUNITY): Payer: BC Managed Care – PPO

## 2014-03-25 ENCOUNTER — Ambulatory Visit (HOSPITAL_COMMUNITY): Payer: BC Managed Care – PPO

## 2014-03-26 ENCOUNTER — Ambulatory Visit (INDEPENDENT_AMBULATORY_CARE_PROVIDER_SITE_OTHER): Payer: BC Managed Care – PPO | Admitting: Internal Medicine

## 2014-03-26 ENCOUNTER — Encounter: Payer: Self-pay | Admitting: Internal Medicine

## 2014-03-26 VITALS — BP 132/82 | HR 83 | Temp 98.2°F | Ht 65.0 in | Wt 166.0 lb

## 2014-03-26 DIAGNOSIS — M25561 Pain in right knee: Secondary | ICD-10-CM

## 2014-03-26 DIAGNOSIS — M25562 Pain in left knee: Secondary | ICD-10-CM | POA: Insufficient documentation

## 2014-03-26 DIAGNOSIS — I10 Essential (primary) hypertension: Secondary | ICD-10-CM

## 2014-03-26 DIAGNOSIS — M25569 Pain in unspecified knee: Secondary | ICD-10-CM

## 2014-03-26 HISTORY — DX: Pain in left knee: M25.562

## 2014-03-26 NOTE — Progress Notes (Signed)
Subjective:    Patient ID: Lindsay Hodges, female    DOB: July 22, 1956, 58 y.o.   MRN: [id number]  HPI  Here to fu after fall 3 [place] ago at park, slipped on moss on rock, actually fell more to the left, hit left shoulder and left chest with soreness only, and also apparently likely twisted the right knee as she fell (no trauma otherwise); had immed pain and swelling, and bruised area she noted just yesterday to the post medial knee, was able to walk after until today, no giveways or falls. No fever or other trauma.  Pt denies other chest pain, increased sob or doe, wheezing, orthopnea, PND, increased LE swelling, palpitations, dizziness or syncope. Past Medical History  Diagnosis Date  . Carotid disease, bilateral     [place] 10/14: R 40-59%, L 60-79%, stable  . Elevated sed rate     remote history of myelosuppressive disorder  . Hyperlipidemia   . Hypertension   . Grave's disease     s/p I-131 ablation 1990s  . Arthritis   . HYPOTHYROIDISM   . Vitamin D deficiency   . Personal history of colonic adenoma 11/30/2002   Past Surgical History  Procedure Laterality Date  . Mouth surgery      jaw surgery  . Jaw surgery  2001  . Oophorectomy Bilateral   . Vaginal hysterectomy  2009    vaginal hysterectomy    reports that she quit smoking about 21 years ago. She has never used smokeless tobacco. She reports that she does not drink alcohol or use illicit drugs. family history includes Arthritis in her father and mother; Atrial fibrillation in her mother; Breast cancer in her other and sister; Hyperlipidemia in her mother; Hypertension in her mother; Stroke in her mother. There is no history of Diabetes or Heart attack. Allergies  Allergen Reactions  . Penicillins     REACTION: swelling/dyspnea   Current Outpatient Prescriptions on File Prior to Visit  Medication Sig Dispense Refill  . aspirin 81 MG tablet Take 81 mg by mouth daily.        Marland Kitchen losartan (COZAAR) 50 MG tablet TAKE 1 TABLET (50 MG  TOTAL) BY MOUTH DAILY.  90 tablet  3  . naproxen sodium (ALEVE) 220 MG tablet Take 1 tablet (220 mg total) by mouth 2 (two) times daily as needed.      . Omega-3 Fatty Acids (FISH OIL) 1000 MG CPDR Take by mouth daily.      . rosuvastatin (CRESTOR) 20 MG tablet Take 1 tablet (20 mg total) by mouth daily.  90 tablet  3  . SYNTHROID 50 MCG tablet TAKE ONE 50 MCG TABLET ON SATURDAYS ONLY  30 tablet  5  . SYNTHROID 75 MCG tablet TAKE 1 TABLET BY MOUTH DAILY. EXCEPT FOR SATURDAY TAKE 50 MG.  90 tablet  2   No current facility-administered medications on file prior to visit.   Review of Systems  Constitutional: Negative for unusual diaphoresis or other sweats  HENT: Negative for ringing in ear Eyes: Negative for double vision or worsening visual disturbance.  Respiratory: Negative for choking and stridor.   Gastrointestinal: Negative for vomiting or other signifcant bowel change Genitourinary: Negative for hematuria or decreased urine volume.  Musculoskeletal: Negative for other MSK pain or swelling Skin: Negative for color change and worsening wound.  Neurological: Negative for tremors and numbness other than noted  Psychiatric/Behavioral: Negative for decreased concentration or agitation other than above       Objective:  Physical Exam BP 132/82  Pulse 83  Temp(Src) 98.2 F (36.8 C) (Oral)  Ht 5\' 5"  (1.651 m)  Wt 166 lb (75.297 kg)  BMI 27.62 kg/m2  SpO2 93% VS noted,  Constitutional: Pt appears well-developed, well-nourished.  HENT: Head: NCAT.  Right Ear: External ear normal.  Left Ear: External ear normal.  Eyes: . Pupils are equal, round, and reactive to light. Conjunctivae and EOM are normal Neck: Normal range of motion. Neck supple.  Cardiovascular: Normal rate and regular rhythm.   Pulmonary/Chest: Effort normal and breath sounds normal.  Abd:  Soft, NT, ND, + BS Neurological: Pt is alert. Not confused , motor grossly intact Skin: Skin is warm. No rash Psychiatric: Pt  behavior is normal. No agitation.  Right knee with 1+ effusion, medial 3 cm area bruising, dereased ROM, diffuse mild tender    Assessment & Plan:

## 2014-03-26 NOTE — Patient Instructions (Signed)
Please continue all other medications as before, and refills have been done if requested.  Please have the pharmacy call with any other refills you may need.  Please continue your efforts at being more active, low cholesterol diet, and weight control.  Please keep your appointments with your specialists as you may have planned  You may wish to make an appt with Dr Smith/sport medicine if not improved in 1-2 wks

## 2014-03-26 NOTE — Progress Notes (Signed)
Pre visit review using our clinic review tool, if applicable. No additional management support is needed unless otherwise documented below in the visit note. 

## 2014-03-28 NOTE — Assessment & Plan Note (Signed)
stable overall by history and exam, recent data reviewed with pt, and pt to continue medical treatment as before,  to f/u any worsening symptoms or concerns BP Readings from Last 3 Encounters:  03/26/14 132/82  08/04/13 124/72  07/01/13 130/66

## 2014-03-28 NOTE — Assessment & Plan Note (Signed)
Exam c/w post traumatic arthritis, cont advil or tylenol prn, does not need crutches or film at this time, to cont to monitor for improvement, consider ortho fu or seeing Dr Smith/sport med in 4-6 days if not improved

## 2014-03-30 ENCOUNTER — Encounter: Payer: Self-pay | Admitting: Family Medicine

## 2014-03-30 ENCOUNTER — Ambulatory Visit (INDEPENDENT_AMBULATORY_CARE_PROVIDER_SITE_OTHER): Payer: BC Managed Care – PPO | Admitting: Family Medicine

## 2014-03-30 ENCOUNTER — Ambulatory Visit: Payer: BC Managed Care – PPO | Admitting: Family Medicine

## 2014-03-30 VITALS — BP 139/70 | HR 72 | Ht 65.0 in

## 2014-03-30 DIAGNOSIS — M25569 Pain in unspecified knee: Secondary | ICD-10-CM

## 2014-03-30 DIAGNOSIS — M25561 Pain in right knee: Secondary | ICD-10-CM

## 2014-03-30 NOTE — Patient Instructions (Signed)
You have strained your calf, hamstring, and flared up the arthritis in your knee. Take tylenol 500mg  1-2 tabs three times a day for pain. Ibuprofen 800mg  three times a day with food for pain and inflammation. Elevate above the level of your heart as much as possible. Icing 15 minutes at a time 3-4 times a day. Calf raises, hamstring curls, hamstring swings 3 sets of 10 once a day. Add ankle weights if these become too easy. ACE wrap for compression and support. Follow up with me in 4 weeks for reevaluation.

## 2014-04-01 ENCOUNTER — Encounter: Payer: Self-pay | Admitting: Family Medicine

## 2014-04-01 NOTE — Assessment & Plan Note (Signed)
consistent with calf and hamstring strains, flare of underlying DJD.  Start with home exercises which were reviewed today, icing, tylenol, nsaids, elevation.  ACE wrap for compression.  F/u in 4 weeks.

## 2014-04-01 NOTE — Progress Notes (Signed)
Patient ID: Lindsay Hodges, female   DOB: October 10, 1955, 58 y.o.   MRN: [id number]  PCP: Gwendolyn Grant, MD  Subjective:   HPI: Patient is a 58 y.o. female here for right leg pain.  Patient reports on 6/23 she accidentally slipped on a rock and fell to her left side. Unsure how right knee/leg was injured. + swelling and bruising. Has improved since then but pain still 7/10 level. Has been using ACE wrap, icing, elevating, taking ibuprofen. Feels like like wants to turn inwards. No catching, locking.  Past Medical History  Diagnosis Date  . Carotid disease, bilateral     [place] 10/14: R 40-59%, L 60-79%, stable  . Elevated sed rate     remote history of myelosuppressive disorder  . Hyperlipidemia   . Hypertension   . Grave's disease     s/p I-131 ablation 1990s  . Arthritis   . HYPOTHYROIDISM   . Vitamin D deficiency   . Personal history of colonic adenoma 11/30/2002    Current Outpatient Prescriptions on File Prior to Visit  Medication Sig Dispense Refill  . aspirin 81 MG tablet Take 81 mg by mouth daily.        Marland Kitchen losartan (COZAAR) 50 MG tablet TAKE 1 TABLET (50 MG TOTAL) BY MOUTH DAILY.  90 tablet  3  . naproxen sodium (ALEVE) 220 MG tablet Take 1 tablet (220 mg total) by mouth 2 (two) times daily as needed.      . Omega-3 Fatty Acids (FISH OIL) 1000 MG CPDR Take by mouth daily.      . rosuvastatin (CRESTOR) 20 MG tablet Take 1 tablet (20 mg total) by mouth daily.  90 tablet  3  . SYNTHROID 50 MCG tablet TAKE ONE 50 MCG TABLET ON SATURDAYS ONLY  30 tablet  5  . SYNTHROID 75 MCG tablet TAKE 1 TABLET BY MOUTH DAILY. EXCEPT FOR SATURDAY TAKE 50 MG.  90 tablet  2   No current facility-administered medications on file prior to visit.    Past Surgical History  Procedure Laterality Date  . Mouth surgery      jaw surgery  . Jaw surgery  2001  . Oophorectomy Bilateral   . Vaginal hysterectomy  2009    vaginal hysterectomy    Allergies  Allergen Reactions  . Penicillins    REACTION: swelling/dyspnea    History   Social History  . Marital Status: Married    Spouse Name: N/A    Number of Children: N/A  . Years of Education: N/A   Occupational History  .      [employer]   Social History Main Topics  . Smoking status: Former Smoker -- 0.50 packs/day for 12 years    Quit date: 02/06/1993  . Smokeless tobacco: Never Used  . Alcohol Use: No  . Drug Use: No  . Sexual Activity: Yes   Other Topics Concern  . Not on file   Social History Narrative   pharmacy tech III    Family History  Problem Relation Age of Onset  . Hypertension Mother   . Arthritis Mother   . Hyperlipidemia Mother   . Stroke Mother     1st age 22, then recurrent 46  . Atrial fibrillation Mother   . Diabetes Neg Hx   . Heart attack Neg Hx   . Arthritis Father   . Breast cancer Other   . Breast cancer Sister     BP 139/70  Pulse 72  Ht 5'  5" (1.651 m)  Review of Systems: See HPI above.    Objective:  Physical Exam:  Gen: NAD  Right knee/leg: Bruising, swelling posterior knee and calf.  No anterior swelling/bruising, effusion. TTP medial hamstring, gastroc, medial joint line.  No other tenderness. FROM with pain on full flexion.  Pain with calf raise, resisted knee flexion. Negative ant/post drawers. Negative valgus/varus testing. Negative lachmanns. Negative mcmurrays, apleys, patellar apprehension. NV intact distally.    Assessment & Plan:  1. Right leg injury - consistent with calf and hamstring strains, flare of underlying DJD.  Start with home exercises which were reviewed today, icing, tylenol, nsaids, elevation.  ACE wrap for compression.  F/u in 4 weeks.

## 2014-05-03 ENCOUNTER — Ambulatory Visit (INDEPENDENT_AMBULATORY_CARE_PROVIDER_SITE_OTHER): Payer: BC Managed Care – PPO | Admitting: Family Medicine

## 2014-05-03 ENCOUNTER — Encounter: Payer: Self-pay | Admitting: Family Medicine

## 2014-05-03 VITALS — BP 124/73 | HR 73 | Ht 65.0 in

## 2014-05-03 DIAGNOSIS — M25569 Pain in unspecified knee: Secondary | ICD-10-CM

## 2014-05-03 DIAGNOSIS — M25561 Pain in right knee: Secondary | ICD-10-CM

## 2014-05-03 NOTE — Patient Instructions (Signed)
Let me know if you want to do a cortisone shot. Other options include imaging (MRI) - I don't think this is necessary at this point - or physical therapy.

## 2014-05-04 ENCOUNTER — Encounter: Payer: Self-pay | Admitting: Family Medicine

## 2014-05-04 NOTE — Progress Notes (Signed)
Patient ID: Lindsay Hodges, female   DOB: 1956/05/22, 58 y.o.   MRN: [id number]  PCP: Gwendolyn Grant, MD  Subjective:   HPI: Patient is a 58 y.o. female here for right leg pain.  6/30: Patient reports on 6/23 she accidentally slipped on a rock and fell to her left side. Unsure how right knee/leg was injured. + swelling and bruising. Has improved since then but pain still 7/10 level. Has been using ACE wrap, icing, elevating, taking ibuprofen. Feels like like wants to turn inwards. No catching, locking.  8/3: Patient reports she has improved since last visit. Doing home exercises. Uses ACE wrap but when not at work. Takes ibuprofen as needed. Worse when sitting a long time. Difficulty getting comfortable at times at night. No catching, locking, giving out.  Past Medical History  Diagnosis Date  . Carotid disease, bilateral     [place] 10/14: R 40-59%, L 60-79%, stable  . Elevated sed rate     remote history of myelosuppressive disorder  . Hyperlipidemia   . Hypertension   . Grave's disease     s/p I-131 ablation 1990s  . Arthritis   . HYPOTHYROIDISM   . Vitamin D deficiency   . Personal history of colonic adenoma 11/30/2002    Current Outpatient Prescriptions on File Prior to Visit  Medication Sig Dispense Refill  . aspirin 81 MG tablet Take 81 mg by mouth daily.        Marland Kitchen losartan (COZAAR) 50 MG tablet TAKE 1 TABLET (50 MG TOTAL) BY MOUTH DAILY.  90 tablet  3  . naproxen sodium (ALEVE) 220 MG tablet Take 1 tablet (220 mg total) by mouth 2 (two) times daily as needed.      . Omega-3 Fatty Acids (FISH OIL) 1000 MG CPDR Take by mouth daily.      . rosuvastatin (CRESTOR) 20 MG tablet Take 1 tablet (20 mg total) by mouth daily.  90 tablet  3  . SYNTHROID 50 MCG tablet TAKE ONE 50 MCG TABLET ON SATURDAYS ONLY  30 tablet  5  . SYNTHROID 75 MCG tablet TAKE 1 TABLET BY MOUTH DAILY. EXCEPT FOR SATURDAY TAKE 50 MG.  90 tablet  2   No current facility-administered medications on file  prior to visit.    Past Surgical History  Procedure Laterality Date  . Mouth surgery      jaw surgery  . Jaw surgery  2001  . Oophorectomy Bilateral   . Vaginal hysterectomy  2009    vaginal hysterectomy    Allergies  Allergen Reactions  . Penicillins     REACTION: swelling/dyspnea    History   Social History  . Marital Status: Married    Spouse Name: N/A    Number of Children: N/A  . Years of Education: N/A   Occupational History  .      [employer]   Social History Main Topics  . Smoking status: Former Smoker -- 0.50 packs/day for 12 years    Quit date: 02/06/1993  . Smokeless tobacco: Never Used  . Alcohol Use: No  . Drug Use: No  . Sexual Activity: Yes   Other Topics Concern  . Not on file   Social History Narrative   pharmacy tech III    Family History  Problem Relation Age of Onset  . Hypertension Mother   . Arthritis Mother   . Hyperlipidemia Mother   . Stroke Mother     1st age 50, then recurrent 66  .  Atrial fibrillation Mother   . Diabetes Neg Hx   . Heart attack Neg Hx   . Arthritis Father   . Breast cancer Other   . Breast cancer Sister     BP 124/73  Pulse 73  Ht 5\' 5"  (1.651 m)  Review of Systems: See HPI above.    Objective:  Physical Exam:  Gen: NAD  Right knee/leg: No longer with bruising, swelling posterior knee and calf.  No effusion. TTP medial joint line, less proximal medial gastroc.  No other tenderness. FROM without pain. Negative ant/post drawers. Negative valgus/varus testing. Negative lachmanns. Negative mcmurrays, apleys, patellar apprehension. NV intact distally.    Assessment & Plan:  1. Right leg injury - Still with some calf pain.  Hamstring no longer tender.  Most pain at medial joint line consistent with flare of underlying DJD.  Continue home exercises.  Discussed icing, tylenol, nsaids, ACE wrap for compression.  Consider cortisone injection, PT, MRI if not improving.

## 2014-05-04 NOTE — Assessment & Plan Note (Signed)
Still with some calf pain.  Hamstring no longer tender.  Most pain at medial joint line consistent with flare of underlying DJD.  Continue home exercises.  Discussed icing, tylenol, nsaids, ACE wrap for compression.  Consider cortisone injection, PT, MRI if not improving.

## 2014-05-07 ENCOUNTER — Encounter: Payer: Self-pay | Admitting: Family Medicine

## 2014-05-07 ENCOUNTER — Ambulatory Visit (INDEPENDENT_AMBULATORY_CARE_PROVIDER_SITE_OTHER): Payer: BC Managed Care – PPO | Admitting: Family Medicine

## 2014-05-07 VITALS — BP 139/73 | HR 73 | Ht 65.0 in

## 2014-05-07 DIAGNOSIS — M25569 Pain in unspecified knee: Secondary | ICD-10-CM

## 2014-05-07 DIAGNOSIS — M25561 Pain in right knee: Secondary | ICD-10-CM

## 2014-05-07 MED ORDER — METHYLPREDNISOLONE ACETATE 40 MG/ML IJ SUSP
40.0000 mg | Freq: Once | INTRAMUSCULAR | Status: AC
  Administered 2014-05-07: 40 mg via INTRA_ARTICULAR

## 2014-05-07 NOTE — Progress Notes (Signed)
Patient ID: Lindsay Hodges, female   DOB: Apr 04, 1956, 58 y.o.   MRN: [id number]  PCP: Gwendolyn Grant, MD  Subjective:   HPI: Patient is a 58 y.o. female here for right leg pain.  6/30: Patient reports on 6/23 she accidentally slipped on a rock and fell to her left side. Unsure how right knee/leg was injured. + swelling and bruising. Has improved since then but pain still 7/10 level. Has been using ACE wrap, icing, elevating, taking ibuprofen. Feels like like wants to turn inwards. No catching, locking.  8/3: Patient reports she has improved since last visit. Doing home exercises. Uses ACE wrap but when not at work. Takes ibuprofen as needed. Worse when sitting a long time. Difficulty getting comfortable at times at night. No catching, locking, giving out.  8/7: Patient returns for right knee injection.  Past Medical History  Diagnosis Date  . Carotid disease, bilateral     [place] 10/14: R 40-59%, L 60-79%, stable  . Elevated sed rate     remote history of myelosuppressive disorder  . Hyperlipidemia   . Hypertension   . Grave's disease     s/p I-131 ablation 1990s  . Arthritis   . HYPOTHYROIDISM   . Vitamin D deficiency   . Personal history of colonic adenoma 11/30/2002    Current Outpatient Prescriptions on File Prior to Visit  Medication Sig Dispense Refill  . aspirin 81 MG tablet Take 81 mg by mouth daily.        Marland Kitchen losartan (COZAAR) 50 MG tablet TAKE 1 TABLET (50 MG TOTAL) BY MOUTH DAILY.  90 tablet  3  . naproxen sodium (ALEVE) 220 MG tablet Take 1 tablet (220 mg total) by mouth 2 (two) times daily as needed.      . Omega-3 Fatty Acids (FISH OIL) 1000 MG CPDR Take by mouth daily.      . rosuvastatin (CRESTOR) 20 MG tablet Take 1 tablet (20 mg total) by mouth daily.  90 tablet  3  . SYNTHROID 50 MCG tablet TAKE ONE 50 MCG TABLET ON SATURDAYS ONLY  30 tablet  5  . SYNTHROID 75 MCG tablet TAKE 1 TABLET BY MOUTH DAILY. EXCEPT FOR SATURDAY TAKE 50 MG.  90 tablet  2    No current facility-administered medications on file prior to visit.    Past Surgical History  Procedure Laterality Date  . Mouth surgery      jaw surgery  . Jaw surgery  2001  . Oophorectomy Bilateral   . Vaginal hysterectomy  2009    vaginal hysterectomy    Allergies  Allergen Reactions  . Penicillins     REACTION: swelling/dyspnea    History   Social History  . Marital Status: Married    Spouse Name: N/A    Number of Children: N/A  . Years of Education: N/A   Occupational History  .      [employer]   Social History Main Topics  . Smoking status: Former Smoker -- 0.50 packs/day for 12 years    Quit date: 02/06/1993  . Smokeless tobacco: Never Used  . Alcohol Use: No  . Drug Use: No  . Sexual Activity: Yes   Other Topics Concern  . Not on file   Social History Narrative   pharmacy tech III    Family History  Problem Relation Age of Onset  . Hypertension Mother   . Arthritis Mother   . Hyperlipidemia Mother   . Stroke Mother  1st age 106, then recurrent 75  . Atrial fibrillation Mother   . Diabetes Neg Hx   . Heart attack Neg Hx   . Arthritis Father   . Breast cancer Other   . Breast cancer Sister     BP 139/73  Pulse 73  Ht 5\' 5"  (1.651 m)  Review of Systems: See HPI above.    Objective:  Physical Exam:  Gen: NAD Exam not repeated today.  Right knee/leg: No longer with bruising, swelling posterior knee and calf.  No effusion. TTP medial joint line, less proximal medial gastroc.  No other tenderness. FROM without pain. Negative ant/post drawers. Negative valgus/varus testing. Negative lachmanns. Negative mcmurrays, apleys, patellar apprehension. NV intact distally.    Assessment & Plan:  1. Right leg injury - injection given today.  Still with some calf pain.  Hamstring no longer tender.  Most pain at medial joint line consistent with flare of underlying DJD.  Continue home exercises.  Discussed icing, tylenol,  nsaids, ACE wrap for compression.  Consider PT, MRI if not improving.  After informed written consent, patient was seated on exam table. Right knee was prepped with alcohol swab and utilizing anteromedial approach, patient's right knee was injected intraarticularly with 3:1 marcaine: depomedrol. Patient tolerated the procedure well without immediate complications.

## 2014-05-07 NOTE — Assessment & Plan Note (Signed)
injection given today.  Still with some calf pain.  Hamstring no longer tender.  Most pain at medial joint line consistent with flare of underlying DJD.  Continue home exercises.  Discussed icing, tylenol, nsaids, ACE wrap for compression.  Consider PT, MRI if not improving.  After informed written consent, patient was seated on exam table. Right knee was prepped with alcohol swab and utilizing anteromedial approach, patient's right knee was injected intraarticularly with 3:1 marcaine: depomedrol. Patient tolerated the procedure well without immediate complications.

## 2014-05-07 NOTE — Addendum Note (Signed)
Addended by: Sherrie George F on: 05/07/2014 11:49 AM   Modules accepted: Orders

## 2014-05-12 ENCOUNTER — Ambulatory Visit (HOSPITAL_COMMUNITY)
Admission: RE | Admit: 2014-05-12 | Discharge: 2014-05-12 | Disposition: A | Discharge status: Home / Self Care | Payer: BC Managed Care – PPO | Source: Ambulatory Visit | Attending: Internal Medicine | Admitting: Internal Medicine

## 2014-05-12 DIAGNOSIS — Z1231 Encounter for screening mammogram for malignant neoplasm of breast: Secondary | ICD-10-CM | POA: Insufficient documentation

## 2014-05-19 ENCOUNTER — Telehealth: Payer: Self-pay | Admitting: Internal Medicine

## 2014-05-19 ENCOUNTER — Ambulatory Visit: Payer: BC Managed Care – PPO | Admitting: Internal Medicine

## 2014-05-19 NOTE — Telephone Encounter (Signed)
Appointment scheduled.

## 2014-05-19 NOTE — Telephone Encounter (Signed)
Patient would like to transfer from Dr. Asa Lente to Dr. Charlett Blake. Is this ok? Thanks!

## 2014-05-19 NOTE — Telephone Encounter (Signed)
Yes, ok with me :)  thanks.

## 2014-05-19 NOTE — Telephone Encounter (Signed)
Fine with me

## 2014-06-12 ENCOUNTER — Encounter: Payer: Self-pay | Admitting: Internal Medicine

## 2014-07-07 ENCOUNTER — Ambulatory Visit (INDEPENDENT_AMBULATORY_CARE_PROVIDER_SITE_OTHER): Payer: BC Managed Care – PPO | Admitting: Cardiology

## 2014-07-07 ENCOUNTER — Encounter: Payer: Self-pay | Admitting: Cardiology

## 2014-07-07 ENCOUNTER — Other Ambulatory Visit (HOSPITAL_BASED_OUTPATIENT_CLINIC_OR_DEPARTMENT_OTHER): Payer: BC Managed Care – PPO

## 2014-07-07 VITALS — BP 130/78 | HR 71 | Ht 65.0 in | Wt 164.8 lb

## 2014-07-07 DIAGNOSIS — I10 Essential (primary) hypertension: Secondary | ICD-10-CM

## 2014-07-07 DIAGNOSIS — I739 Peripheral vascular disease, unspecified: Secondary | ICD-10-CM

## 2014-07-07 DIAGNOSIS — I779 Disorder of arteries and arterioles, unspecified: Secondary | ICD-10-CM

## 2014-07-07 MED ORDER — PRAVASTATIN SODIUM 40 MG PO TABS
40.0000 mg | ORAL_TABLET | Freq: Every evening | ORAL | Status: DC
Start: 2014-07-07 — End: 2014-08-09

## 2014-07-07 NOTE — Assessment & Plan Note (Signed)
She has not tolerated Lipitor or Crestor because of myalgias. We will try Pravachol 40 mg daily. Check lipids and liver in 6 weeks.

## 2014-07-07 NOTE — Patient Instructions (Signed)
Your physician wants you to follow-up in: [place] will receive a reminder letter in the mail two months in advance. If you don't receive a letter, please call our office to schedule the follow-up appointment.   Your physician has requested that you have a carotid duplex. This test is an ultrasound of the carotid arteries in your neck. It looks at blood flow through these arteries that supply the brain with blood. Allow one hour for this exam. There are no restrictions or special instructions.   START PRAVASTATIN 40 MG ONCE DAILY AT BEDTIME  Your physician recommends that you return for lab work in: 4 WEEKS= DO NOT EAT PRIOR TO LAB WORK

## 2014-07-07 NOTE — Assessment & Plan Note (Signed)
Continue aspirin. Schedule followup carotid Dopplers.

## 2014-07-07 NOTE — Progress Notes (Signed)
      HPI: FU cerebrovascular disease. Echocardiogram in 2004 showed normal LV function. She had a stress myouve in 2010 with positive ECG and normal images. F/U myovue 10/15/11 with positive ECG but normal images also. Last carotid Dopplers in Oct 2014 showed 60-79% left and 40-59% right stenosis. Followup recommended in one year. Since last seen, the patient denies any dyspnea on exertion, orthopnea, PND, pedal edema, palpitations, syncope or chest pain. Some feelings of anxietyThat she feels may be related to her thyroid.   Current Outpatient Prescriptions  Medication Sig Dispense Refill  . aspirin 81 MG tablet Take 81 mg by mouth daily.        . Omega-3 Fatty Acids (FISH OIL) 1000 MG CPDR Take by mouth daily.      . rosuvastatin (CRESTOR) 20 MG tablet Take 1 tablet (20 mg total) by mouth daily.  90 tablet  3  . SYNTHROID 50 MCG tablet TAKE ONE 50 MCG TABLET ON SATURDAYS ONLY  30 tablet  5  . SYNTHROID 75 MCG tablet TAKE 1 TABLET BY MOUTH DAILY. EXCEPT FOR SATURDAY TAKE 50 MG.  90 tablet  2   No current facility-administered medications for this visit.     Past Medical History  Diagnosis Date  . Carotid disease, bilateral     [place] 10/14: R 40-59%, L 60-79%, stable  . Elevated sed rate     remote history of myelosuppressive disorder  . Hyperlipidemia   . Hypertension   . Grave's disease     s/p I-131 ablation 1990s  . Arthritis   . HYPOTHYROIDISM   . Vitamin D deficiency   . Personal history of colonic adenoma 11/30/2002    Past Surgical History  Procedure Laterality Date  . Mouth surgery      jaw surgery  . Jaw surgery  2001  . Oophorectomy Bilateral   . Vaginal hysterectomy  2009    vaginal hysterectomy    History   Social History  . Marital Status: Married    Spouse Name: N/A    Number of Children: N/A  . Years of Education: N/A   Occupational History  .      [employer]   Social History Main Topics  . Smoking status: Former Smoker -- 0.50 packs/day  for 12 years    Quit date: 02/06/1993  . Smokeless tobacco: Never Used  . Alcohol Use: No  . Drug Use: No  . Sexual Activity: Yes   Other Topics Concern  . Not on file   Social History Narrative   pharmacy tech III    ROS: no fevers or chills, productive cough, hemoptysis, dysphasia, odynophagia, melena, hematochezia, dysuria, hematuria, rash, seizure activity, orthopnea, PND, pedal edema, claudication. Remaining systems are negative.  Physical Exam: Well-developed well-nourished in no acute distress.  Skin is warm and dry.  HEENT is normal.  Neck is supple.  Chest is clear to auscultation with normal expansion.  Cardiovascular exam is regular rate and rhythm.  Abdominal exam nontender or distended. No masses palpated. Extremities show no edema. neuro grossly intact  ECG Sinus rhythm at a rate of 71. No ST changes.

## 2014-07-07 NOTE — Assessment & Plan Note (Signed)
Check TSH 

## 2014-07-08 ENCOUNTER — Ambulatory Visit (HOSPITAL_BASED_OUTPATIENT_CLINIC_OR_DEPARTMENT_OTHER): Payer: BC Managed Care – PPO

## 2014-07-08 ENCOUNTER — Ambulatory Visit (HOSPITAL_BASED_OUTPATIENT_CLINIC_OR_DEPARTMENT_OTHER)
Admission: RE | Admit: 2014-07-08 | Discharge: 2014-07-08 | Disposition: A | Discharge status: Home / Self Care | Payer: BC Managed Care – PPO | Source: Ambulatory Visit | Attending: Cardiology | Admitting: Cardiology

## 2014-07-08 DIAGNOSIS — I739 Peripheral vascular disease, unspecified: Secondary | ICD-10-CM

## 2014-07-08 DIAGNOSIS — I779 Disorder of arteries and arterioles, unspecified: Secondary | ICD-10-CM

## 2014-07-08 DIAGNOSIS — I6523 Occlusion and stenosis of bilateral carotid arteries: Secondary | ICD-10-CM | POA: Insufficient documentation

## 2014-07-16 ENCOUNTER — Other Ambulatory Visit: Payer: Self-pay

## 2014-08-02 ENCOUNTER — Encounter: Payer: Self-pay | Admitting: Cardiology

## 2014-08-09 ENCOUNTER — Other Ambulatory Visit: Payer: Self-pay | Admitting: Cardiology

## 2014-08-09 LAB — LIPID PANEL
CHOL/HDL RATIO: 2.7 ratio
Cholesterol: 157 mg/dL (ref 0–200)
HDL: 59 mg/dL (ref >39)
LDL CALC: 78 mg/dL (ref 0–99)
TRIGLYCERIDES: 102 mg/dL (ref <150)
VLDL: 20 mg/dL (ref 0–40)

## 2014-08-09 LAB — TSH: TSH: 2.992 u[IU]/mL (ref 0.350–4.500)

## 2014-08-09 LAB — HEPATIC FUNCTION PANEL
ALBUMIN: 4.2 g/dL (ref 3.5–5.2)
ALK PHOS: 88 U/L (ref 39–117)
ALT: 13 U/L (ref 0–35)
AST: 15 U/L (ref 0–37)
BILIRUBIN TOTAL: 0.6 mg/dL (ref 0.2–1.2)
Bilirubin, Direct: 0.1 mg/dL (ref 0.0–0.3)
Indirect Bilirubin: 0.5 mg/dL (ref 0.2–1.2)
TOTAL PROTEIN: 6.7 g/dL (ref 6.0–8.3)

## 2014-08-16 ENCOUNTER — Telehealth: Payer: Self-pay | Admitting: Cardiology

## 2014-08-16 NOTE — Telephone Encounter (Signed)
Left message for pt to call.

## 2014-08-16 NOTE — Telephone Encounter (Signed)
New problem   Pt is having problems with her statin. Please call pt.

## 2014-08-16 NOTE — Telephone Encounter (Signed)
Spoke with pt, Aware of dr crenshaw's recommendations.  °

## 2014-08-16 NOTE — Telephone Encounter (Signed)
Spoke with pt, she was recently started on pravastatin, she reports she can not tolerate the pain in her legs and she is having tingling in her feet. She has also tried crestor and lipitor in the past. Patient told to stop the pravastatin. She is willing to try red yeast rice if dr Stanford Breed thinks it may help. Will forward for dr Stanford Breed review

## 2014-08-16 NOTE — Telephone Encounter (Signed)
Stay off statins; no red yeast rice Kirk Ruths

## 2014-08-23 ENCOUNTER — Encounter: Payer: Self-pay | Admitting: Internal Medicine

## 2014-08-23 ENCOUNTER — Ambulatory Visit (INDEPENDENT_AMBULATORY_CARE_PROVIDER_SITE_OTHER): Payer: BC Managed Care – PPO | Admitting: Internal Medicine

## 2014-08-23 ENCOUNTER — Other Ambulatory Visit (INDEPENDENT_AMBULATORY_CARE_PROVIDER_SITE_OTHER): Payer: BC Managed Care – PPO

## 2014-08-23 VITALS — BP 134/82 | HR 68 | Temp 97.8°F | Ht 65.0 in | Wt 164.2 lb

## 2014-08-23 DIAGNOSIS — Z0000 Encounter for general adult medical examination without abnormal findings: Secondary | ICD-10-CM

## 2014-08-23 DIAGNOSIS — E038 Other specified hypothyroidism: Secondary | ICD-10-CM

## 2014-08-23 DIAGNOSIS — I10 Essential (primary) hypertension: Secondary | ICD-10-CM

## 2014-08-23 DIAGNOSIS — Z23 Encounter for immunization: Secondary | ICD-10-CM

## 2014-08-23 DIAGNOSIS — E785 Hyperlipidemia, unspecified: Secondary | ICD-10-CM

## 2014-08-23 LAB — BASIC METABOLIC PANEL
BUN: 14 mg/dL (ref 6–23)
CHLORIDE: 104 meq/L (ref 96–112)
CO2: 29 mEq/L (ref 19–32)
Calcium: 9.5 mg/dL (ref 8.4–10.5)
Creatinine, Ser: 0.6 mg/dL (ref 0.4–1.2)
GFR: 100.39 mL/min (ref >60.00)
Glucose, Bld: 88 mg/dL (ref 70–99)
POTASSIUM: 4 meq/L (ref 3.5–5.1)
Sodium: 140 mEq/L (ref 135–145)

## 2014-08-23 LAB — CBC WITH DIFFERENTIAL/PLATELET
BASOS PCT: 0.7 % (ref 0.0–3.0)
Basophils Absolute: 0 10*3/uL (ref 0.0–0.1)
EOS PCT: 3.1 % (ref 0.0–5.0)
Eosinophils Absolute: 0.2 10*3/uL (ref 0.0–0.7)
HEMATOCRIT: 42.5 % (ref 36.0–46.0)
Hemoglobin: 13.8 g/dL (ref 12.0–15.0)
LYMPHS ABS: 1.6 10*3/uL (ref 0.7–4.0)
Lymphocytes Relative: 27 % (ref 12.0–46.0)
MCHC: 32.4 g/dL (ref 30.0–36.0)
MCV: 91.4 fl (ref 78.0–100.0)
MONOS PCT: 10.9 % (ref 3.0–12.0)
Monocytes Absolute: 0.7 10*3/uL (ref 0.1–1.0)
NEUTROS PCT: 58.3 % (ref 43.0–77.0)
Neutro Abs: 3.5 10*3/uL (ref 1.4–7.7)
PLATELETS: 210 10*3/uL (ref 150.0–400.0)
RBC: 4.65 Mil/uL (ref 3.87–5.11)
RDW: 13.5 % (ref 11.5–15.5)
WBC: 6.1 10*3/uL (ref 4.0–10.5)

## 2014-08-23 LAB — URINALYSIS, ROUTINE W REFLEX MICROSCOPIC
Bilirubin Urine: NEGATIVE
Hgb urine dipstick: NEGATIVE
Ketones, ur: NEGATIVE
Leukocytes, UA: NEGATIVE
NITRITE: NEGATIVE
PH: 7 (ref 5.0–8.0)
RBC / HPF: NONE SEEN (ref 0–?)
Specific Gravity, Urine: 1.02 (ref 1.000–1.030)
TOTAL PROTEIN, URINE-UPE24: NEGATIVE
URINE GLUCOSE: NEGATIVE
Urobilinogen, UA: 0.2 (ref 0.0–1.0)

## 2014-08-23 MED ORDER — LOSARTAN POTASSIUM 50 MG PO TABS: 50.0000 mg | ORAL_TABLET | Freq: Every day | ORAL | Status: DC

## 2014-08-23 MED ORDER — LEVOTHYROXINE SODIUM 75 MCG PO TABS: 75.0000 ug | ORAL_TABLET | Freq: Every day | ORAL | Status: DC

## 2014-08-23 MED ORDER — LEVOTHYROXINE SODIUM 50 MCG PO TABS: 50.0000 ug | ORAL_TABLET | ORAL | Status: DC

## 2014-08-23 MED ORDER — ZOSTER VACCINE LIVE 19400 UNT/0.65ML ~~LOC~~ SOLR: 0.6500 mL | Freq: Once | SUBCUTANEOUS | Status: DC

## 2014-08-23 NOTE — Patient Instructions (Addendum)
It was good to see you today.  We have reviewed your prior records including labs and tests today  Health Maintenance reviewed - one time Shingles vaccine updated today  -all other recommended immunizations and age-appropriate screenings are up-to-date.  Test(s) ordered today. Your results will be released to [place] (or called to you) after review, usually within 72hours after test completion. If any changes need to be made, you will be notified at that same time.  Medications reviewed and updated, no changes recommended at this time.  Call if blood pressure remains greater than 140/80 4 increase in Cozaar dosing as discussed  Okay to change to different primary care provider if scheduling works better for you. Please let me know how I can help if needed  Please schedule followup in 12 months for annual exam and labs, call sooner if problems.  Health Maintenance Adopting a healthy lifestyle and getting preventive care can go a long way to promote health and wellness. Talk with your health care provider about what schedule of regular examinations is right for you. This is a good chance for you to check in with your provider about disease prevention and staying healthy. In between checkups, there are plenty of things you can do on your own. Experts have done a lot of research about which lifestyle changes and preventive measures are most likely to keep you healthy. Ask your health care provider for more information. WEIGHT AND DIET  Eat a healthy diet  Be sure to include plenty of vegetables, fruits, low-fat dairy products, and lean protein.  Do not eat a lot of foods high in solid fats, added sugars, or salt.  Get regular exercise. This is one of the most important things you can do for your health.  Most adults should exercise for at least 150 minutes each week. The exercise should increase your heart rate and make you sweat (moderate-intensity exercise).  Most adults should also do  strengthening exercises at least twice a week. This is in addition to the moderate-intensity exercise.  Maintain a healthy weight  Body mass index (BMI) is a measurement that can be used to identify possible weight problems. It estimates body fat based on height and weight. Your health care provider can help determine your BMI and help you achieve or maintain a healthy weight.  For females 37 years of age and older:   A BMI below 18.5 is considered underweight.  A BMI of 18.5 to 24.9 is normal.  A BMI of 25 to 29.9 is considered overweight.  A BMI of 30 and above is considered obese.  Watch levels of cholesterol and blood lipids  You should start having your blood tested for lipids and cholesterol at 60 years of age, then have this test every 5 years.  You may need to have your cholesterol levels checked more often if:  Your lipid or cholesterol levels are high.  You are older than 60 years of age.  You are at high risk for heart disease.  CANCER SCREENING   Lung Cancer  Lung cancer screening is recommended for adults 64-3 years old who are at high risk for lung cancer because of a history of smoking.  A yearly low-dose CT scan of the lungs is recommended for people who:  Currently smoke.  Have quit within the past 15 years.  Have at least a 30-pack-year history of smoking. A pack year is smoking an average of one pack of cigarettes a day for 1 year.  Yearly screening should continue until it has been 15 years since you quit.  Yearly screening should stop if you develop a health problem that would prevent you from having lung cancer treatment.  Breast Cancer  Practice breast self-awareness. This means understanding how your breasts normally appear and feel.  It also means doing regular breast self-exams. Let your health care provider know about any changes, no matter how small.  If you are in your 20s or 30s, you should have a clinical breast exam (CBE) by a  health care provider every 1-3 years as part of a regular health exam.  If you are 40 or older, have a CBE every year. Also consider having a breast X-ray (mammogram) every year.  If you have a family history of breast cancer, talk to your health care provider about genetic screening.  If you are at high risk for breast cancer, talk to your health care provider about having an MRI and a mammogram every year.  Breast cancer gene (BRCA) assessment is recommended for women who have family members with BRCA-related cancers. BRCA-related cancers include:  Breast.  Ovarian.  Tubal.  Peritoneal cancers.  Results of the assessment will determine the need for genetic counseling and BRCA1 and BRCA2 testing. Cervical Cancer Routine pelvic examinations to screen for cervical cancer are no longer recommended for nonpregnant women who are considered low risk for cancer of the pelvic organs (ovaries, uterus, and vagina) and who do not have symptoms. A pelvic examination may be necessary if you have symptoms including those associated with pelvic infections. Ask your health care provider if a screening pelvic exam is right for you.   The Pap test is the screening test for cervical cancer for women who are considered at risk.  If you had a hysterectomy for a problem that was not cancer or a condition that could lead to cancer, then you no longer need Pap tests.  If you are older than 65 years, and you have had normal Pap tests for the past 10 years, you no longer need to have Pap tests.  If you have had past treatment for cervical cancer or a condition that could lead to cancer, you need Pap tests and screening for cancer for at least 20 years after your treatment.  If you no longer get a Pap test, assess your risk factors if they change (such as having a new sexual partner). This can affect whether you should start being screened again.  Some women have medical problems that increase their chance of  getting cervical cancer. If this is the case for you, your health care provider may recommend more frequent screening and Pap tests.  The human papillomavirus (HPV) test is another test that may be used for cervical cancer screening. The HPV test looks for the virus that can cause cell changes in the cervix. The cells collected during the Pap test can be tested for HPV.  The HPV test can be used to screen women 30 years of age and older. Getting tested for HPV can extend the interval between normal Pap tests from three to five years.  An HPV test also should be used to screen women of any age who have unclear Pap test results.  After 60 years of age, women should have HPV testing as often as Pap tests.  Colorectal Cancer  This type of cancer can be detected and often prevented.  Routine colorectal cancer screening usually begins at 60 years of age and continues   through 60 years of age.  Your health care provider may recommend screening at an earlier age if you have risk factors for colon cancer.  Your health care provider may also recommend using home test kits to check for hidden blood in the stool.  A small camera at the end of a tube can be used to examine your colon directly (sigmoidoscopy or colonoscopy). This is done to check for the earliest forms of colorectal cancer.  Routine screening usually begins at age 50.  Direct examination of the colon should be repeated every 5-10 years through 60 years of age. However, you may need to be screened more often if early forms of precancerous polyps or small growths are found. Skin Cancer  Check your skin from head to toe regularly.  Tell your health care provider about any new moles or changes in moles, especially if there is a change in a mole's shape or color.  Also tell your health care provider if you have a mole that is larger than the size of a pencil eraser.  Always use sunscreen. Apply sunscreen liberally and repeatedly  throughout the day.  Protect yourself by wearing long sleeves, pants, a wide-brimmed hat, and sunglasses whenever you are outside. HEART DISEASE, DIABETES, AND HIGH BLOOD PRESSURE   Have your blood pressure checked at least every 1-2 years. High blood pressure causes heart disease and increases the risk of stroke.  If you are between 55 years and 79 years old, ask your health care provider if you should take aspirin to prevent strokes.  Have regular diabetes screenings. This involves taking a blood sample to check your fasting blood sugar level.  If you are at a normal weight and have a low risk for diabetes, have this test once every three years after 60 years of age.  If you are overweight and have a high risk for diabetes, consider being tested at a younger age or more often. PREVENTING INFECTION  Hepatitis B  If you have a higher risk for hepatitis B, you should be screened for this virus. You are considered at high risk for hepatitis B if:  You were born in a country where hepatitis B is common. Ask your health care provider which countries are considered high risk.  Your parents were born in a high-risk country, and you have not been immunized against hepatitis B (hepatitis B vaccine).  You have HIV or AIDS.  You use needles to inject street drugs.  You live with someone who has hepatitis B.  You have had sex with someone who has hepatitis B.  You get hemodialysis treatment.  You take certain medicines for conditions, including cancer, organ transplantation, and autoimmune conditions. Hepatitis C  Blood testing is recommended for:  Everyone born from 1945 through 1965.  Anyone with known risk factors for hepatitis C. Sexually transmitted infections (STIs)  You should be screened for sexually transmitted infections (STIs) including gonorrhea and chlamydia if:  You are sexually active and are younger than 60 years of age.  You are older than 60 years of age and your  health care provider tells you that you are at risk for this type of infection.  Your sexual activity has changed since you were last screened and you are at an increased risk for chlamydia or gonorrhea. Ask your health care provider if you are at risk.  If you do not have HIV, but are at risk, it may be recommended that you take a prescription medicine daily   to prevent HIV infection. This is called pre-exposure prophylaxis (PrEP). You are considered at risk if:  You are sexually active and do not regularly use condoms or know the HIV status of your partner(s).  You take drugs by injection.  You are sexually active with a partner who has HIV. Talk with your health care provider about whether you are at high risk of being infected with HIV. If you choose to begin PrEP, you should first be tested for HIV. You should then be tested every 3 months for as long as you are taking PrEP.  PREGNANCY   If you are premenopausal and you may become pregnant, ask your health care provider about preconception counseling.  If you may become pregnant, take 400 to 800 micrograms (mcg) of folic acid every day.  If you want to prevent pregnancy, talk to your health care provider about birth control (contraception). OSTEOPOROSIS AND MENOPAUSE   Osteoporosis is a disease in which the bones lose minerals and strength with aging. This can result in serious bone fractures. Your risk for osteoporosis can be identified using a bone density scan.  If you are 65 years of age or older, or if you are at risk for osteoporosis and fractures, ask your health care provider if you should be screened.  Ask your health care provider whether you should take a calcium or vitamin D supplement to lower your risk for osteoporosis.  Menopause may have certain physical symptoms and risks.  Hormone replacement therapy may reduce some of these symptoms and risks. Talk to your health care provider about whether hormone replacement  therapy is right for you.  HOME CARE INSTRUCTIONS   Schedule regular health, dental, and eye exams.  Stay current with your immunizations.   Do not use any tobacco products including cigarettes, chewing tobacco, or electronic cigarettes.  If you are pregnant, do not drink alcohol.  If you are breastfeeding, limit how much and how often you drink alcohol.  Limit alcohol intake to no more than 1 drink per day for nonpregnant women. One drink equals 12 ounces of beer, 5 ounces of wine, or 1 ounces of hard liquor.  Do not use street drugs.  Do not share needles.  Ask your health care provider for help if you need support or information about quitting drugs.  Tell your health care provider if you often feel depressed.  Tell your health care provider if you have ever been abused or do not feel safe at home. Document Released: 04/02/2011 Document Revised: 02/01/2014 Document Reviewed: 08/19/2013 [employer] Patient Information 2015 [employer]. This information is not intended to replace advice given to you by your health care provider. Make sure you discuss any questions you have with your health care provider.  

## 2014-08-23 NOTE — Progress Notes (Signed)
Subjective:    Patient ID: Lindsay Hodges, female    DOB: [birth year], 58 y.o.   MRN: [id number]  HPI  patient is here today for annual physical. Patient feels well and has no complaints.  Also reviewed chronic medical issues and interval medical events  Past Medical History  Diagnosis Date  . Carotid disease, bilateral     [place] 10/15: R 40-59%, L 50-69%, stable for years  . Elevated sed rate     remote history of myelosuppressive disorder  . Hyperlipidemia   . Hypertension   . Grave's disease     s/p I-131 ablation 1990s  . Arthritis   . HYPOTHYROIDISM   . Vitamin D deficiency   . Personal history of colonic adenoma 11/30/2002   Family History  Problem Relation Age of Onset  . Hypertension Mother   . Arthritis Mother   . Hyperlipidemia Mother   . Stroke Mother     1st age 61, then recurrent 61  . Atrial fibrillation Mother   . Diabetes Neg Hx   . Heart attack Neg Hx   . Arthritis Father   . Breast cancer Other   . Breast cancer Sister    History  Substance Use Topics  . Smoking status: Former Smoker -- 0.50 packs/day for 12 years    Quit date: 02/06/1993  . Smokeless tobacco: Never Used  . Alcohol Use: No    Review of Systems  Constitutional: Negative for fatigue and unexpected weight change.  Eyes: Negative for visual disturbance.  Respiratory: Negative for cough, shortness of breath and wheezing.   Cardiovascular: Negative for chest pain, palpitations and leg swelling.  Gastrointestinal: Negative for nausea, abdominal pain and diarrhea.  Neurological: Positive for light-headedness (2x this week, no presyncope) and numbness ("tingle" in LUE and LLE (anterior shin) only at night when still - no weakness or impact on ADLs). Negative for dizziness, seizures, syncope, facial asymmetry, speech difficulty, weakness and headaches.  Psychiatric/Behavioral: Negative for dysphoric mood. The patient is not nervous/anxious.   All other systems reviewed and are  negative.      Objective:   Physical Exam  BP 152/74 mmHg  Pulse 68  Temp(Src) 97.8 F (36.6 C) (Oral)  Ht 5\' 5"  (1.651 m)  Wt 164 lb 4 oz (74.503 kg)  BMI 27.33 kg/m2  SpO2 94% Wt Readings from Last 3 Encounters:  08/23/14 164 lb 4 oz (74.503 kg)  07/07/14 164 lb 12.8 oz (74.753 kg)  03/26/14 166 lb (75.297 kg)   Constitutional: She appears well-developed and well-nourished. No distress.  HENT: Head: Normocephalic and atraumatic. Ears: B TMs ok, no erythema or effusion; Nose: Nose normal. Mouth/Throat: Oropharynx is clear and moist. No oropharyngeal exudate.  Eyes: Conjunctivae and EOM are normal. Pupils are equal, round, and reactive to light. No scleral icterus.  Neck: Normal range of motion. Neck supple. No JVD present. No thyromegaly present.  Cardiovascular: Normal rate, regular rhythm and normal heart sounds.  No murmur heard. No BLE edema. Pulmonary/Chest: Effort normal and breath sounds normal. No respiratory distress. She has no wheezes.  Abdominal: Soft. Bowel sounds are normal. She exhibits no distension. There is no tenderness. no masses GU/breast: defer to gyn Musculoskeletal: Normal range of motion, no joint effusions. No gross deformities Neurological: She is alert and oriented to person, place, and time. No cranial nerve deficit. Coordination, balance, strength, speech and gait are normal.  Skin: Skin is warm and dry. No rash noted. No erythema.  Psychiatric: She has a normal  mood and affect. Her behavior is normal. Judgment and thought content normal.    Lab Results  Component Value Date   WBC 6.9 08/04/2013   HGB 13.4 08/04/2013   HCT 39.6 08/04/2013   PLT 187.0 08/04/2013   GLUCOSE 88 08/04/2013   CHOL 157 08/09/2014   TRIG 102 08/09/2014   HDL 59 08/09/2014   LDLCALC 78 08/09/2014   ALT 13 08/09/2014   AST 15 08/09/2014   NA 137 08/04/2013   K 4.6 08/04/2013   CL 104 08/04/2013   CREATININE 0.6 08/04/2013   BUN 13 08/04/2013   CO2 30 08/04/2013    TSH 2.992 08/09/2014    US Carotid Duplex Bilateral  07/08/2014   CLINICAL DATA:  Bilateral carotid artery stenosis.  EXAM: BILATERAL CAROTID DUPLEX ULTRASOUND  TECHNIQUE: Pearline Cables scale imaging, color Doppler and duplex ultrasound were performed of bilateral carotid and vertebral arteries in the neck.  COMPARISON:  None.  FINDINGS: Criteria: Quantification of carotid stenosis is based on velocity parameters that correlate the residual internal carotid diameter with NASCET-based stenosis levels, using the diameter of the distal internal carotid lumen as the denominator for stenosis measurement.  The following velocity measurements were obtained:  RIGHT  ICA:  99/23 cm/sec  CCA:  63/87 cm/sec  SYSTOLIC ICA/CCA RATIO:  1.4  DIASTOLIC ICA/CCA RATIO:  2.0  ECA:  111 cm/sec  LEFT  ICA:  192/46 cm/sec  CCA:  56/43 cm/sec  SYSTOLIC ICA/CCA RATIO:  2.7  DIASTOLIC ICA/CCA RATIO:  3.8  ECA:  95 cm/sec  RIGHT CAROTID ARTERY: Mild heterogeneous plaque is noted in the right carotid bulb and proximal right internal carotid artery consistent with less than 50% diameter stenosis based on ultrasound and Doppler criteria.  RIGHT VERTEBRAL ARTERY:  Antegrade flow is noted.  LEFT CAROTID ARTERY: Moderate irregular plaque formation is noted in the left carotid bulb and proximal left internal carotid artery consistent with 50-69% stenosis based on ultrasound and Doppler criteria.  LEFT VERTEBRAL ARTERY:  Antegrade flow is noted.  IMPRESSION: Mild heterogeneous plaque is noted in the proximal right internal carotid artery consistent with less than 50% diameter stenosis based on ultrasound and Doppler criteria.  Moderate heterogeneous plaque is noted in the proximal left internal carotid artery consistent with 50-69% stenosis based on ultrasound and Doppler criteria.   Electronically Signed   By: Sabino Dick M.D.   On: 07/08/2014 17:01       Assessment & Plan:   CPX/z00.00 - Patient has been counseled on age-appropriate routine  health concerns for screening and prevention. These are reviewed and up-to-date. Immunizations are up-to-date or declined. Labs ordered and reviewed.  Problem List Items Addressed This Visit    Hyperlipidemia    On statin for same - has tried atorva, rosuva, most recently prava 11/2012 trial of coenzyme Q10 for counterbalance of myalgia side effects did not change symptoms Intermittent use but understands importance of compliance given co-morbid dz Defer other mgmt to cards as ongoing    Relevant Medications      losartan (COZAAR) tablet   Hypertension    BP Readings from Last 3 Encounters:  08/23/14 134/82  07/07/14 130/78  05/07/14 139/73   The current medical regimen is generally effective;  continue present plan (ARB) and medications.     Relevant Medications      losartan (COZAAR) tablet   Hypothyroidism    Post ablation hypothyroid disease since mid 1990s The current medical regimen is effective;  continue present plan and medications.  Lab Results  Component Value Date   TSH 2.992 08/09/2014      Relevant Medications      levothyroxine (SYNTHROID, LEVOTHROID) tablet      levothyroxine (SYNTHROID, LEVOTHROID) tablet    Other Visit Diagnoses    Routine general medical examination at a health care facility    -  Primary    Relevant Orders       Basic metabolic panel       CBC with Differential       Urinalysis, Routine w reflex microscopic

## 2014-08-23 NOTE — Assessment & Plan Note (Signed)
BP Readings from Last 3 Encounters:  08/23/14 134/82  07/07/14 130/78  05/07/14 139/73   The current medical regimen is generally effective;  continue present plan (ARB) and medications.

## 2014-08-23 NOTE — Assessment & Plan Note (Signed)
On statin for same - has tried atorva, rosuva, most recently prava 11/2012 trial of coenzyme Q10 for counterbalance of myalgia side effects did not change symptoms Intermittent use but understands importance of compliance given co-morbid dz Defer other mgmt to cards as ongoing

## 2014-08-23 NOTE — Progress Notes (Signed)
Pre visit review using our clinic review tool, if applicable. No additional management support is needed unless otherwise documented below in the visit note. 

## 2014-08-23 NOTE — Assessment & Plan Note (Signed)
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 Lab Results  Component Value Date   TSH 2.992 08/09/2014

## 2014-11-02 ENCOUNTER — Ambulatory Visit: Payer: BC Managed Care – PPO | Admitting: Family Medicine

## 2014-11-11 ENCOUNTER — Encounter: Payer: Self-pay | Admitting: Family Medicine

## 2014-11-11 ENCOUNTER — Ambulatory Visit (INDEPENDENT_AMBULATORY_CARE_PROVIDER_SITE_OTHER): Payer: BLUE CROSS/BLUE SHIELD | Admitting: Family Medicine

## 2014-11-11 VITALS — BP 120/78 | HR 73 | Temp 97.9°F | Ht 65.0 in | Wt 166.0 lb

## 2014-11-11 DIAGNOSIS — E039 Hypothyroidism, unspecified: Secondary | ICD-10-CM

## 2014-11-11 NOTE — Progress Notes (Signed)
Pre visit review using our clinic review tool, if applicable. No additional management support is needed unless otherwise documented below in the visit note. 

## 2014-11-12 ENCOUNTER — Encounter: Payer: Self-pay | Admitting: Family Medicine

## 2014-11-12 ENCOUNTER — Ambulatory Visit (INDEPENDENT_AMBULATORY_CARE_PROVIDER_SITE_OTHER): Payer: BLUE CROSS/BLUE SHIELD | Admitting: Family Medicine

## 2014-11-12 DIAGNOSIS — E039 Hypothyroidism, unspecified: Secondary | ICD-10-CM

## 2014-11-12 DIAGNOSIS — E559 Vitamin D deficiency, unspecified: Secondary | ICD-10-CM

## 2014-11-12 DIAGNOSIS — E785 Hyperlipidemia, unspecified: Secondary | ICD-10-CM

## 2014-11-12 DIAGNOSIS — I10 Essential (primary) hypertension: Secondary | ICD-10-CM

## 2014-11-12 NOTE — Patient Instructions (Signed)
Preventive Care for Adults A healthy lifestyle and preventive care can promote health and wellness. Preventive health guidelines for women include the following key practices.  A routine yearly physical is a good way to check with your health care provider about your health and preventive screening. It is a chance to share any concerns and updates on your health and to receive a thorough exam.  Visit your dentist for a routine exam and preventive care every 6 months. Brush your teeth twice a day and floss once a day. Good oral hygiene prevents tooth decay and gum disease.  The frequency of eye exams is based on your age, health, family medical history, use of contact lenses, and other factors. Follow your health care provider's recommendations for frequency of eye exams.  Eat a healthy diet. Foods like vegetables, fruits, whole grains, low-fat dairy products, and lean protein foods contain the nutrients you need without too many calories. Decrease your intake of foods high in solid fats, added sugars, and salt. Eat the right amount of calories for you.Get information about a proper diet from your health care provider, if necessary.  Regular physical exercise is one of the most important things you can do for your health. Most adults should get at least 150 minutes of moderate-intensity exercise (any activity that increases your heart rate and causes you to sweat) each week. In addition, most adults need muscle-strengthening exercises on 2 or more days a week.  Maintain a healthy weight. The body mass index (BMI) is a screening tool to identify possible weight problems. It provides an estimate of body fat based on height and weight. Your health care provider can find your BMI and can help you achieve or maintain a healthy weight.For adults 20 years and older:  A BMI below 18.5 is considered underweight.  A BMI of 18.5 to 24.9 is normal.  A BMI of 25 to 29.9 is considered overweight.  A BMI of  30 and above is considered obese.  Maintain normal blood lipids and cholesterol levels by exercising and minimizing your intake of saturated fat. Eat a balanced diet with plenty of fruit and vegetables. Blood tests for lipids and cholesterol should begin at age 76 and be repeated every 5 years. If your lipid or cholesterol levels are high, you are over 50, or you are at high risk for heart disease, you may need your cholesterol levels checked more frequently.Ongoing high lipid and cholesterol levels should be treated with medicines if diet and exercise are not working.  If you smoke, find out from your health care provider how to quit. If you do not use tobacco, do not start.  Lung cancer screening is recommended for adults aged 22-80 years who are at high risk for developing lung cancer because of a history of smoking. A yearly low-dose CT scan of the lungs is recommended for people who have at least a 30-pack-year history of smoking and are a current smoker or have quit within the past 15 years. A pack year of smoking is smoking an average of 1 pack of cigarettes a day for 1 year (for example: 1 pack a day for 30 years or 2 packs a day for 15 years). Yearly screening should continue until the smoker has stopped smoking for at least 15 years. Yearly screening should be stopped for people who develop a health problem that would prevent them from having lung cancer treatment.  If you are pregnant, do not drink alcohol. If you are breastfeeding,  be very cautious about drinking alcohol. If you are not pregnant and choose to drink alcohol, do not have more than 1 drink per day. One drink is considered to be 12 ounces (355 mL) of beer, 5 ounces (148 mL) of wine, or 1.5 ounces (44 mL) of liquor.  Avoid use of street drugs. Do not share needles with anyone. Ask for help if you need support or instructions about stopping the use of drugs.  High blood pressure causes heart disease and increases the risk of  stroke. Your blood pressure should be checked at least every 1 to 2 years. Ongoing high blood pressure should be treated with medicines if weight loss and exercise do not work.  If you are 75-52 years old, ask your health care provider if you should take aspirin to prevent strokes.  Diabetes screening involves taking a blood sample to check your fasting blood sugar level. This should be done once every 3 years, after age 15, if you are within normal weight and without risk factors for diabetes. Testing should be considered at a younger age or be carried out more frequently if you are overweight and have at least 1 risk factor for diabetes.  Breast cancer screening is essential preventive care for women. You should practice "breast self-awareness." This means understanding the normal appearance and feel of your breasts and may include breast self-examination. Any changes detected, no matter how small, should be reported to a health care provider. Women in their 58s and 30s should have a clinical breast exam (CBE) by a health care provider as part of a regular health exam every 1 to 3 years. After age 16, women should have a CBE every year. Starting at age 53, women should consider having a mammogram (breast X-ray test) every year. Women who have a family history of breast cancer should talk to their health care provider about genetic screening. Women at a high risk of breast cancer should talk to their health care providers about having an MRI and a mammogram every year.  Breast cancer gene (BRCA)-related cancer risk assessment is recommended for women who have family members with BRCA-related cancers. BRCA-related cancers include breast, ovarian, tubal, and peritoneal cancers. Having family members with these cancers may be associated with an increased risk for harmful changes (mutations) in the breast cancer genes BRCA1 and BRCA2. Results of the assessment will determine the need for genetic counseling and  BRCA1 and BRCA2 testing.  Routine pelvic exams to screen for cancer are no longer recommended for nonpregnant women who are considered low risk for cancer of the pelvic organs (ovaries, uterus, and vagina) and who do not have symptoms. Ask your health care provider if a screening pelvic exam is right for you.  If you have had past treatment for cervical cancer or a condition that could lead to cancer, you need Pap tests and screening for cancer for at least 20 years after your treatment. If Pap tests have been discontinued, your risk factors (such as having a new sexual partner) need to be reassessed to determine if screening should be resumed. Some women have medical problems that increase the chance of getting cervical cancer. In these cases, your health care provider may recommend more frequent screening and Pap tests.  The HPV test is an additional test that may be used for cervical cancer screening. The HPV test looks for the virus that can cause the cell changes on the cervix. The cells collected during the Pap test can be  tested for HPV. The HPV test could be used to screen women aged 30 years and older, and should be used in women of any age who have unclear Pap test results. After the age of 30, women should have HPV testing at the same frequency as a Pap test.  Colorectal cancer can be detected and often prevented. Most routine colorectal cancer screening begins at the age of 50 years and continues through age 75 years. However, your health care provider may recommend screening at an earlier age if you have risk factors for colon cancer. On a yearly basis, your health care provider may provide home test kits to check for hidden blood in the stool. Use of a small camera at the end of a tube, to directly examine the colon (sigmoidoscopy or colonoscopy), can detect the earliest forms of colorectal cancer. Talk to your health care provider about this at age 50, when routine screening begins. Direct  exam of the colon should be repeated every 5-10 years through age 75 years, unless early forms of pre-cancerous polyps or small growths are found.  People who are at an increased risk for hepatitis B should be screened for this virus. You are considered at high risk for hepatitis B if:  You were born in a country where hepatitis B occurs often. Talk with your health care provider about which countries are considered high risk.  Your parents were born in a high-risk country and you have not received a shot to protect against hepatitis B (hepatitis B vaccine).  You have HIV or AIDS.  You use needles to inject street drugs.  You live with, or have sex with, someone who has hepatitis B.  You get hemodialysis treatment.  You take certain medicines for conditions like cancer, organ transplantation, and autoimmune conditions.  Hepatitis C blood testing is recommended for all people born from 1945 through 1965 and any individual with known risks for hepatitis C.  Practice safe sex. Use condoms and avoid high-risk sexual practices to reduce the spread of sexually transmitted infections (STIs). STIs include gonorrhea, chlamydia, syphilis, trichomonas, herpes, HPV, and human immunodeficiency virus (HIV). Herpes, HIV, and HPV are viral illnesses that have no cure. They can result in disability, cancer, and death.  You should be screened for sexually transmitted illnesses (STIs) including gonorrhea and chlamydia if:  You are sexually active and are younger than 24 years.  You are older than 24 years and your health care provider tells you that you are at risk for this type of infection.  Your sexual activity has changed since you were last screened and you are at an increased risk for chlamydia or gonorrhea. Ask your health care provider if you are at risk.  If you are at risk of being infected with HIV, it is recommended that you take a prescription medicine daily to prevent HIV infection. This is  called preexposure prophylaxis (PrEP). You are considered at risk if:  You are a heterosexual woman, are sexually active, and are at increased risk for HIV infection.  You take drugs by injection.  You are sexually active with a partner who has HIV.  Talk with your health care provider about whether you are at high risk of being infected with HIV. If you choose to begin PrEP, you should first be tested for HIV. You should then be tested every 3 months for as long as you are taking PrEP.  Osteoporosis is a disease in which the bones lose minerals and strength   with aging. This can result in serious bone fractures or breaks. The risk of osteoporosis can be identified using a bone density scan. Women ages 65 years and over and women at risk for fractures or osteoporosis should discuss screening with their health care providers. Ask your health care provider whether you should take a calcium supplement or vitamin D to reduce the rate of osteoporosis.  Menopause can be associated with physical symptoms and risks. Hormone replacement therapy is available to decrease symptoms and risks. You should talk to your health care provider about whether hormone replacement therapy is right for you.  Use sunscreen. Apply sunscreen liberally and repeatedly throughout the day. You should seek shade when your shadow is shorter than you. Protect yourself by wearing long sleeves, pants, a wide-brimmed hat, and sunglasses year round, whenever you are outdoors.  Once a month, do a whole body skin exam, using a mirror to look at the skin on your back. Tell your health care provider of new moles, moles that have irregular borders, moles that are larger than a pencil eraser, or moles that have changed in shape or color.  Stay current with required vaccines (immunizations).  Influenza vaccine. All adults should be immunized every year.  Tetanus, diphtheria, and acellular pertussis (Td, Tdap) vaccine. Pregnant women should  receive 1 dose of Tdap vaccine during each pregnancy. The dose should be obtained regardless of the length of time since the last dose. Immunization is preferred during the 27th-36th week of gestation. An adult who has not previously received Tdap or who does not know her vaccine status should receive 1 dose of Tdap. This initial dose should be followed by tetanus and diphtheria toxoids (Td) booster doses every 10 years. Adults with an unknown or incomplete history of completing a 3-dose immunization series with Td-containing vaccines should begin or complete a primary immunization series including a Tdap dose. Adults should receive a Td booster every 10 years.  Varicella vaccine. An adult without evidence of immunity to varicella should receive 2 doses or a second dose if she has previously received 1 dose. Pregnant females who do not have evidence of immunity should receive the first dose after pregnancy. This first dose should be obtained before leaving the health care facility. The second dose should be obtained 4-8 weeks after the first dose.  Human papillomavirus (HPV) vaccine. Females aged 13-26 years who have not received the vaccine previously should obtain the 3-dose series. The vaccine is not recommended for use in pregnant females. However, pregnancy testing is not needed before receiving a dose. If a female is found to be pregnant after receiving a dose, no treatment is needed. In that case, the remaining doses should be delayed until after the pregnancy. Immunization is recommended for any person with an immunocompromised condition through the age of 26 years if she did not get any or all doses earlier. During the 3-dose series, the second dose should be obtained 4-8 weeks after the first dose. The third dose should be obtained 24 weeks after the first dose and 16 weeks after the second dose.  Zoster vaccine. One dose is recommended for adults aged 60 years or older unless certain conditions are  present.  Measles, mumps, and rubella (MMR) vaccine. Adults born before 1957 generally are considered immune to measles and mumps. Adults born in 1957 or later should have 1 or more doses of MMR vaccine unless there is a contraindication to the vaccine or there is laboratory evidence of immunity to   each of the three diseases. A routine second dose of MMR vaccine should be obtained at least 28 days after the first dose for students attending postsecondary schools, health care workers, or international travelers. People who received inactivated measles vaccine or an unknown type of measles vaccine during 1963-1967 should receive 2 doses of MMR vaccine. People who received inactivated mumps vaccine or an unknown type of mumps vaccine before 1979 and are at high risk for mumps infection should consider immunization with 2 doses of MMR vaccine. For females of childbearing age, rubella immunity should be determined. If there is no evidence of immunity, females who are not pregnant should be vaccinated. If there is no evidence of immunity, females who are pregnant should delay immunization until after pregnancy. Unvaccinated health care workers born before 1957 who lack laboratory evidence of measles, mumps, or rubella immunity or laboratory confirmation of disease should consider measles and mumps immunization with 2 doses of MMR vaccine or rubella immunization with 1 dose of MMR vaccine.  Pneumococcal 13-valent conjugate (PCV13) vaccine. When indicated, a person who is uncertain of her immunization history and has no record of immunization should receive the PCV13 vaccine. An adult aged 19 years or older who has certain medical conditions and has not been previously immunized should receive 1 dose of PCV13 vaccine. This PCV13 should be followed with a dose of pneumococcal polysaccharide (PPSV23) vaccine. The PPSV23 vaccine dose should be obtained at least 8 weeks after the dose of PCV13 vaccine. An adult aged 19  years or older who has certain medical conditions and previously received 1 or more doses of PPSV23 vaccine should receive 1 dose of PCV13. The PCV13 vaccine dose should be obtained 1 or more years after the last PPSV23 vaccine dose.  Pneumococcal polysaccharide (PPSV23) vaccine. When PCV13 is also indicated, PCV13 should be obtained first. All adults aged 65 years and older should be immunized. An adult younger than age 65 years who has certain medical conditions should be immunized. Any person who resides in a nursing home or long-term care facility should be immunized. An adult smoker should be immunized. People with an immunocompromised condition and certain other conditions should receive both PCV13 and PPSV23 vaccines. People with human immunodeficiency virus (HIV) infection should be immunized as soon as possible after diagnosis. Immunization during chemotherapy or radiation therapy should be avoided. Routine use of PPSV23 vaccine is not recommended for American Indians, [place] Natives, or people younger than 65 years unless there are medical conditions that require PPSV23 vaccine. When indicated, people who have unknown immunization and have no record of immunization should receive PPSV23 vaccine. One-time revaccination 5 years after the first dose of PPSV23 is recommended for people aged 19-64 years who have chronic kidney failure, nephrotic syndrome, asplenia, or immunocompromised conditions. People who received 1-2 doses of PPSV23 before age 65 years should receive another dose of PPSV23 vaccine at age 65 years or later if at least 5 years have passed since the previous dose. Doses of PPSV23 are not needed for people immunized with PPSV23 at or after age 65 years.  Meningococcal vaccine. Adults with asplenia or persistent complement component deficiencies should receive 2 doses of quadrivalent meningococcal conjugate (MenACWY-D) vaccine. The doses should be obtained at least 2 months apart.  Microbiologists working with certain meningococcal bacteria, military recruits, people at risk during an outbreak, and people who travel to or live in countries with a high rate of meningitis should be immunized. A first-year college student up through age   21 years who is living in a residence hall should receive a dose if she did not receive a dose on or after her 16th birthday. Adults who have certain high-risk conditions should receive one or more doses of vaccine.  Hepatitis A vaccine. Adults who wish to be protected from this disease, have certain high-risk conditions, work with hepatitis A-infected animals, work in hepatitis A research labs, or travel to or work in countries with a high rate of hepatitis A should be immunized. Adults who were previously unvaccinated and who anticipate close contact with an international adoptee during the first 60 days after arrival in the [place] from a country with a high rate of hepatitis A should be immunized.  Hepatitis B vaccine. Adults who wish to be protected from this disease, have certain high-risk conditions, may be exposed to blood or other infectious body fluids, are household contacts or sex partners of hepatitis B positive people, are clients or workers in certain care facilities, or travel to or work in countries with a high rate of hepatitis B should be immunized.  Haemophilus influenzae type b (Hib) vaccine. A previously unvaccinated person with asplenia or sickle cell disease or having a scheduled splenectomy should receive 1 dose of Hib vaccine. Regardless of previous immunization, a recipient of a hematopoietic stem cell transplant should receive a 3-dose series 6-12 months after her successful transplant. Hib vaccine is not recommended for adults with HIV infection. Preventive Services / Frequency Ages 64 to 68 years  Blood pressure check.** / Every 1 to 2 years.  Lipid and cholesterol check.** / Every 5 years beginning at age  22.  Clinical breast exam.** / Every 3 years for women in their 88s and 53s.  BRCA-related cancer risk assessment.** / For women who have family members with a BRCA-related cancer (breast, ovarian, tubal, or peritoneal cancers).  Pap test.** / Every 2 years from ages 90 through 51. Every 3 years starting at age 21 through age 56 or 3 with a history of 3 consecutive normal Pap tests.  HPV screening.** / Every 3 years from ages 24 through ages 1 to 46 with a history of 3 consecutive normal Pap tests.  Hepatitis C blood test.** / For any individual with known risks for hepatitis C.  Skin self-exam. / Monthly.  Influenza vaccine. / Every year.  Tetanus, diphtheria, and acellular pertussis (Tdap, Td) vaccine.** / Consult your health care provider. Pregnant women should receive 1 dose of Tdap vaccine during each pregnancy. 1 dose of Td every 10 years.  Varicella vaccine.** / Consult your health care provider. Pregnant females who do not have evidence of immunity should receive the first dose after pregnancy.  HPV vaccine. / 3 doses over 6 months, if 72 and younger. The vaccine is not recommended for use in pregnant females. However, pregnancy testing is not needed before receiving a dose.  Measles, mumps, rubella (MMR) vaccine.** / You need at least 1 dose of MMR if you were born in 1957 or later. You may also need a 2nd dose. For females of childbearing age, rubella immunity should be determined. If there is no evidence of immunity, females who are not pregnant should be vaccinated. If there is no evidence of immunity, females who are pregnant should delay immunization until after pregnancy.  Pneumococcal 13-valent conjugate (PCV13) vaccine.** / Consult your health care provider.  Pneumococcal polysaccharide (PPSV23) vaccine.** / 1 to 2 doses if you smoke cigarettes or if you have certain conditions.  Meningococcal vaccine.** /  1 dose if you are age 19 to 21 years and a first-year college  student living in a residence hall, or have one of several medical conditions, you need to get vaccinated against meningococcal disease. You may also need additional booster doses.  Hepatitis A vaccine.** / Consult your health care provider.  Hepatitis B vaccine.** / Consult your health care provider.  Haemophilus influenzae type b (Hib) vaccine.** / Consult your health care provider. Ages 40 to 64 years  Blood pressure check.** / Every 1 to 2 years.  Lipid and cholesterol check.** / Every 5 years beginning at age 20 years.  Lung cancer screening. / Every year if you are aged 55-80 years and have a 30-pack-year history of smoking and currently smoke or have quit within the past 15 years. Yearly screening is stopped once you have quit smoking for at least 15 years or develop a health problem that would prevent you from having lung cancer treatment.  Clinical breast exam.** / Every year after age 40 years.  BRCA-related cancer risk assessment.** / For women who have family members with a BRCA-related cancer (breast, ovarian, tubal, or peritoneal cancers).  Mammogram.** / Every year beginning at age 40 years and continuing for as long as you are in good health. Consult with your health care provider.  Pap test.** / Every 3 years starting at age 30 years through age 65 or 70 years with a history of 3 consecutive normal Pap tests.  HPV screening.** / Every 3 years from ages 30 years through ages 65 to 70 years with a history of 3 consecutive normal Pap tests.  Fecal occult blood test (FOBT) of stool. / Every year beginning at age 50 years and continuing until age 75 years. You may not need to do this test if you get a colonoscopy every 10 years.  Flexible sigmoidoscopy or colonoscopy.** / Every 5 years for a flexible sigmoidoscopy or every 10 years for a colonoscopy beginning at age 50 years and continuing until age 75 years.  Hepatitis C blood test.** / For all people born from 1945 through  1965 and any individual with known risks for hepatitis C.  Skin self-exam. / Monthly.  Influenza vaccine. / Every year.  Tetanus, diphtheria, and acellular pertussis (Tdap/Td) vaccine.** / Consult your health care provider. Pregnant women should receive 1 dose of Tdap vaccine during each pregnancy. 1 dose of Td every 10 years.  Varicella vaccine.** / Consult your health care provider. Pregnant females who do not have evidence of immunity should receive the first dose after pregnancy.  Zoster vaccine.** / 1 dose for adults aged 60 years or older.  Measles, mumps, rubella (MMR) vaccine.** / You need at least 1 dose of MMR if you were born in 1957 or later. You may also need a 2nd dose. For females of childbearing age, rubella immunity should be determined. If there is no evidence of immunity, females who are not pregnant should be vaccinated. If there is no evidence of immunity, females who are pregnant should delay immunization until after pregnancy.  Pneumococcal 13-valent conjugate (PCV13) vaccine.** / Consult your health care provider.  Pneumococcal polysaccharide (PPSV23) vaccine.** / 1 to 2 doses if you smoke cigarettes or if you have certain conditions.  Meningococcal vaccine.** / Consult your health care provider.  Hepatitis A vaccine.** / Consult your health care provider.  Hepatitis B vaccine.** / Consult your health care provider.  Haemophilus influenzae type b (Hib) vaccine.** / Consult your health care provider. Ages 65   years and over  Blood pressure check.** / Every 1 to 2 years.  Lipid and cholesterol check.** / Every 5 years beginning at age 22 years.  Lung cancer screening. / Every year if you are aged 73-80 years and have a 30-pack-year history of smoking and currently smoke or have quit within the past 15 years. Yearly screening is stopped once you have quit smoking for at least 15 years or develop a health problem that would prevent you from having lung cancer  treatment.  Clinical breast exam.** / Every year after age 4 years.  BRCA-related cancer risk assessment.** / For women who have family members with a BRCA-related cancer (breast, ovarian, tubal, or peritoneal cancers).  Mammogram.** / Every year beginning at age 40 years and continuing for as long as you are in good health. Consult with your health care provider.  Pap test.** / Every 3 years starting at age 9 years through age 34 or 91 years with 3 consecutive normal Pap tests. Testing can be stopped between 65 and 70 years with 3 consecutive normal Pap tests and no abnormal Pap or HPV tests in the past 10 years.  HPV screening.** / Every 3 years from ages 57 years through ages 64 or 45 years with a history of 3 consecutive normal Pap tests. Testing can be stopped between 65 and 70 years with 3 consecutive normal Pap tests and no abnormal Pap or HPV tests in the past 10 years.  Fecal occult blood test (FOBT) of stool. / Every year beginning at age 15 years and continuing until age 17 years. You may not need to do this test if you get a colonoscopy every 10 years.  Flexible sigmoidoscopy or colonoscopy.** / Every 5 years for a flexible sigmoidoscopy or every 10 years for a colonoscopy beginning at age 86 years and continuing until age 71 years.  Hepatitis C blood test.** / For all people born from [birth year] through 1967 and any individual with known risks for hepatitis C.  Osteoporosis screening.** / A one-time screening for women ages 83 years and over and women at risk for fractures or osteoporosis.  Skin self-exam. / Monthly.  Influenza vaccine. / Every year.  Tetanus, diphtheria, and acellular pertussis (Tdap/Td) vaccine.** / 1 dose of Td every 10 years.  Varicella vaccine.** / Consult your health care provider.  Zoster vaccine.** / 1 dose for adults aged 61 years or older.  Pneumococcal 13-valent conjugate (PCV13) vaccine.** / Consult your health care provider.  Pneumococcal  polysaccharide (PPSV23) vaccine.** / 1 dose for all adults aged 28 years and older.  Meningococcal vaccine.** / Consult your health care provider.  Hepatitis A vaccine.** / Consult your health care provider.  Hepatitis B vaccine.** / Consult your health care provider.  Haemophilus influenzae type b (Hib) vaccine.** / Consult your health care provider. ** Family history and personal history of risk and conditions may change your health care provider's recommendations. Document Released: 11/13/2001 Document Revised: 02/01/2014 Document Reviewed: 02/12/2011 [facility] Patient Information 2015 [place], [place]. This information is not intended to replace advice given to you by your health care provider. Make sure you discuss any questions you have with your health care provider.

## 2014-11-14 NOTE — Progress Notes (Signed)
Patient had to leave without being seen.  

## 2014-11-29 NOTE — Assessment & Plan Note (Signed)
On Levothyroxine, continue to monitor 

## 2014-11-29 NOTE — Assessment & Plan Note (Signed)
Encouraged over the counter supplements and monitor

## 2014-11-29 NOTE — Progress Notes (Signed)
Lindsay Hodges [id number] 26-Nov-1955 11/29/2014      Progress Note New Patient  Subjective  Chief Complaint  No chief complaint on file.   HPI  Patient is a 59 year old female in today for routine medical care. She is here today to establish care with our office. She is in her usual state of health and doing well. She does work here in the building. No recent illness or acute concerns. Denies CP/palp/SOB/HA/congestion/fevers/GI or GU c/o. Taking meds as prescribed  Past Medical History  Diagnosis Date  . Carotid disease, bilateral     [place] 10/15: R 40-59%, L 50-69%, stable for years  . Elevated sed rate     remote history of myelosuppressive disorder  . Hyperlipidemia   . Hypertension   . Grave's disease     s/p I-131 ablation 1990s  . Arthritis   . HYPOTHYROIDISM   . Vitamin D deficiency   . Personal history of colonic adenoma 11/30/2002    Past Surgical History  Procedure Laterality Date  . Jaw surgery  2001  . Oophorectomy Bilateral   . Vaginal hysterectomy  2009    vaginal hysterectomy    Family History  Problem Relation Age of Onset  . Hypertension Mother   . Arthritis Mother   . Hyperlipidemia Mother   . Stroke Mother     1st age 13, then recurrent 61  . Atrial fibrillation Mother   . Epilepsy Mother   . Diabetes Neg Hx   . Heart attack Neg Hx   . Arthritis Father   . Breast cancer Other   . Breast cancer Sister   . Cancer Sister     Recurrent Breast CA  . Atrial fibrillation Brother   . Drug abuse Maternal Aunt     History   Social History  . Marital Status: Married    Spouse Name: N/A  . Number of Children: N/A  . Years of Education: N/A   Occupational History  .      [employer]   Social History Main Topics  . Smoking status: Former Smoker -- 0.50 packs/day for 12 years    Quit date: 02/06/1993  . Smokeless tobacco: Never Used  . Alcohol Use: No  . Drug Use: No  . Sexual Activity: Yes   Other Topics Concern  . Not on file    Social History Narrative   pharmacy tech III    Current Outpatient Prescriptions on File Prior to Visit  Medication Sig Dispense Refill  . aspirin 81 MG tablet Take 81 mg by mouth daily.      . cycloSPORINE (RESTASIS) 0.05 % ophthalmic emulsion Place 1 drop into both eyes 2 (two) times daily. 0.4 mL   . levothyroxine (SYNTHROID) 50 MCG tablet Take 1 tablet (50 mcg total) by mouth once a week. Saturdays only 30 tablet 5  . levothyroxine (SYNTHROID) 75 MCG tablet Take 1 tablet (75 mcg total) by mouth daily. Except Saturdays 90 tablet 3  . losartan (COZAAR) 50 MG tablet Take 1 tablet (50 mg total) by mouth daily. 90 tablet 3  . Omega-3 Fatty Acids (FISH OIL) 1000 MG CPDR Take by mouth daily.     No current facility-administered medications on file prior to visit.    Allergies  Allergen Reactions  . Penicillins     REACTION: swelling/dyspnea    Review of Systems  Review of Systems  Constitutional: Negative for fever, chills and malaise/fatigue.  HENT: Negative for congestion, hearing loss and  nosebleeds.   Eyes: Negative for discharge.  Respiratory: Negative for cough, sputum production, shortness of breath and wheezing.   Cardiovascular: Negative for chest pain, palpitations and leg swelling.  Gastrointestinal: Negative for heartburn, nausea, vomiting, abdominal pain, diarrhea, constipation and blood in stool.  Genitourinary: Negative for dysuria, urgency, frequency and hematuria.  Musculoskeletal: Negative for myalgias, back pain and falls.  Skin: Negative for rash.  Neurological: Negative for dizziness, tremors, sensory change, focal weakness, loss of consciousness, weakness and headaches.  Endo/Heme/Allergies: Negative for polydipsia. Does not bruise/bleed easily.  Psychiatric/Behavioral: Negative for depression and suicidal ideas. The patient is not nervous/anxious and does not have insomnia.     Objective  There were no vitals taken for this visit.  Physical  Exam  Physical Exam  Constitutional: She is oriented to person, place, and time and well-developed, well-nourished, and in no distress. No distress.  HENT:  Head: Normocephalic and atraumatic.  Right Ear: External ear normal.  Left Ear: External ear normal.  Nose: Nose normal.  Mouth/Throat: Oropharynx is clear and moist. No oropharyngeal exudate.  Eyes: Conjunctivae are normal. Pupils are equal, round, and reactive to light. Right eye exhibits no discharge. Left eye exhibits no discharge. No scleral icterus.  Neck: Normal range of motion. Neck supple. No thyromegaly present.  Cardiovascular: Normal rate, regular rhythm, normal heart sounds and intact distal pulses.   No murmur heard. Pulmonary/Chest: Effort normal and breath sounds normal. No respiratory distress. She has no wheezes. She has no rales.  Abdominal: Soft. Bowel sounds are normal. She exhibits no distension and no mass. There is no tenderness.  Musculoskeletal: Normal range of motion. She exhibits no edema or tenderness.  Lymphadenopathy:    She has no cervical adenopathy.  Neurological: She is alert and oriented to person, place, and time. She has normal reflexes. No cranial nerve deficit. Coordination normal.  Skin: Skin is warm and dry. No rash noted. She is not diaphoretic.  Psychiatric: Mood, memory and affect normal.       Assessment & Plan  Hypertension Well controlled, no changes to meds. Encouraged heart healthy diet such as the DASH diet and exercise as tolerated.    Hypothyroidism On Levothyroxine, continue to monitor   Hyperlipidemia Encouraged heart healthy diet, increase exercise, avoid trans fats, consider a krill oil cap daily   Vitamin D deficiency Encouraged over the counter supplements and monitor

## 2014-11-29 NOTE — Assessment & Plan Note (Signed)
Well controlled, no changes to meds. Encouraged heart healthy diet such as the DASH diet and exercise as tolerated.  °

## 2014-11-29 NOTE — Assessment & Plan Note (Signed)
Encouraged heart healthy diet, increase exercise, avoid trans fats, consider a krill oil cap daily 

## 2014-12-10 ENCOUNTER — Other Ambulatory Visit: Payer: Self-pay | Admitting: Internal Medicine

## 2015-02-14 ENCOUNTER — Ambulatory Visit (INDEPENDENT_AMBULATORY_CARE_PROVIDER_SITE_OTHER): Payer: BLUE CROSS/BLUE SHIELD | Admitting: Physician Assistant

## 2015-02-14 ENCOUNTER — Encounter: Payer: Self-pay | Admitting: Physician Assistant

## 2015-02-14 VITALS — BP 130/74 | HR 78 | Temp 97.9°F | Ht 65.0 in | Wt 161.4 lb

## 2015-02-14 DIAGNOSIS — M79605 Pain in left leg: Secondary | ICD-10-CM | POA: Diagnosis not present

## 2015-02-14 DIAGNOSIS — M79604 Pain in right leg: Secondary | ICD-10-CM | POA: Diagnosis not present

## 2015-02-14 DIAGNOSIS — M7061 Trochanteric bursitis, right hip: Secondary | ICD-10-CM | POA: Insufficient documentation

## 2015-02-14 MED ORDER — MELOXICAM 15 MG PO TABS: 15.0000 mg | ORAL_TABLET | Freq: Every day | ORAL | Status: DC

## 2015-02-14 NOTE — Progress Notes (Signed)
Patient presents to clinic today c/o bilateral leg pain x 4 months, worse at night.  Pain described as a pressure and tightness.  Denies swelling, skin changes, warmth or coolness.  Denies trauma or injury. Has + history of venous insufficiency.  Denies history of RA. Has not taken anything for symptoms.  Is not a smoker.  Past Medical History  Diagnosis Date  . Carotid disease, bilateral     [place] 10/15: R 40-59%, L 50-69%, stable for years  . Elevated sed rate     remote history of myelosuppressive disorder  . Hyperlipidemia   . Hypertension   . Grave's disease     s/p I-131 ablation 1990s  . Arthritis   . HYPOTHYROIDISM   . Vitamin D deficiency   . Personal history of colonic adenoma 11/30/2002    Current Outpatient Prescriptions on File Prior to Visit  Medication Sig Dispense Refill  . aspirin 81 MG tablet Take 81 mg by mouth daily.      . cycloSPORINE (RESTASIS) 0.05 % ophthalmic emulsion Place 1 drop into both eyes 2 (two) times daily. 0.4 mL   . levothyroxine (SYNTHROID) 50 MCG tablet Take 1 tablet (50 mcg total) by mouth once a week. Saturdays only 30 tablet 5  . levothyroxine (SYNTHROID) 75 MCG tablet Take 1 tablet (75 mcg total) by mouth daily. Except Saturdays 90 tablet 3  . losartan (COZAAR) 50 MG tablet Take 1 tablet (50 mg total) by mouth daily. 90 tablet 3  . SYNTHROID 50 MCG tablet TAKE 1 TABLET BY MOUTH ON SATURDAYS ONLY 30 tablet 3  . Omega-3 Fatty Acids (FISH OIL) 1000 MG CPDR Take by mouth daily.     No current facility-administered medications on file prior to visit.    Allergies  Allergen Reactions  . Penicillins     REACTION: swelling/dyspnea    Family History  Problem Relation Age of Onset  . Hypertension Mother   . Arthritis Mother   . Hyperlipidemia Mother   . Stroke Mother     1st age 28, then recurrent 50  . Atrial fibrillation Mother   . Epilepsy Mother   . Diabetes Neg Hx   . Heart attack Neg Hx   . Arthritis Father   . Breast cancer Other    . Breast cancer Sister   . Cancer Sister     Recurrent Breast CA  . Atrial fibrillation Brother   . Drug abuse Maternal Aunt     History   Social History  . Marital Status: Married    Spouse Name: N/A  . Number of Children: N/A  . Years of Education: N/A   Occupational History  .      [employer]   Social History Main Topics  . Smoking status: Former Smoker -- 0.50 packs/day for 12 years    Quit date: 02/06/1993  . Smokeless tobacco: Never Used  . Alcohol Use: No  . Drug Use: No  . Sexual Activity: Yes   Other Topics Concern  . None   Social History Narrative   pharmacy tech III   Review of Systems -- See  HPI.  All other ROS are negative.  BP 130/74 mmHg  Pulse 78  Temp(Src) 97.9 F (36.6 C) (Oral)  Ht 5\' 5"  (1.651 m)  Wt 161 lb 6 oz (73.199 kg)  BMI 26.85 kg/m2  SpO2 97%  Physical Exam  Constitutional: She is oriented to person, place, and time and well-developed, well-nourished, and in no  distress.  HENT:  Head: Normocephalic and atraumatic.  Cardiovascular: Normal rate, regular rhythm, normal heart sounds and intact distal pulses.   Pulses:      Popliteal pulses are 2+ on the right side, and 2+ on the left side.       Dorsalis pedis pulses are 2+ on the right side, and 2+ on the left side.  Varicosities noted of bilateral lower extremity. No swelling noted.  Pulmonary/Chest: Effort normal and breath sounds normal. No respiratory distress. She has no wheezes. She has no rales. She exhibits no tenderness.  Neurological: She is alert and oriented to person, place, and time.  Skin: Skin is warm and dry. No rash noted.  Psychiatric: Affect normal.  Vitals reviewed.  Assessment/Plan: Bilateral leg pain Suspect vascular versus nerve versus inflammatory.  Will obtain labs to include CBC, hs-CRP, ferritin, BMP today.  Will attempt trial of anti-inflammatory medication.  If all normal, will obtain US art/ven to assess.

## 2015-02-14 NOTE — Patient Instructions (Signed)
Please go to the lab for blood work. I will call you with your results. Avoid heavy lifting or overexertion.  Take Mobic as directed with food. If all labs are good, we will proceed with [place] to further assess.

## 2015-02-14 NOTE — Progress Notes (Signed)
Pre visit review using our clinic review tool, if applicable. No additional management support is needed unless otherwise documented below in the visit note. 

## 2015-02-14 NOTE — Assessment & Plan Note (Signed)
Suspect vascular versus nerve versus inflammatory.  Will obtain labs to include CBC, hs-CRP, ferritin, BMP today.  Will attempt trial of anti-inflammatory medication.  If all normal, will obtain US art/ven to assess.

## 2015-02-15 LAB — CBC WITH DIFFERENTIAL/PLATELET
BASOS PCT: 0.6 % (ref 0.0–3.0)
Basophils Absolute: 0 10*3/uL (ref 0.0–0.1)
EOS PCT: 4.1 % (ref 0.0–5.0)
Eosinophils Absolute: 0.3 10*3/uL (ref 0.0–0.7)
HEMATOCRIT: 39.8 % (ref 36.0–46.0)
HEMOGLOBIN: 13.1 g/dL (ref 12.0–15.0)
Lymphocytes Relative: 23.1 % (ref 12.0–46.0)
Lymphs Abs: 1.5 10*3/uL (ref 0.7–4.0)
MCHC: 33 g/dL (ref 30.0–36.0)
MCV: 89.3 fl (ref 78.0–100.0)
MONO ABS: 0.6 10*3/uL (ref 0.1–1.0)
Monocytes Relative: 9.6 % (ref 3.0–12.0)
Neutro Abs: 4.1 10*3/uL (ref 1.4–7.7)
Neutrophils Relative %: 62.6 % (ref 43.0–77.0)
Platelets: 220 10*3/uL (ref 150.0–400.0)
RBC: 4.46 Mil/uL (ref 3.87–5.11)
RDW: 14 % (ref 11.5–15.5)
WBC: 6.5 10*3/uL (ref 4.0–10.5)

## 2015-02-15 LAB — HIGH SENSITIVITY CRP: CRP, High Sensitivity: 1.41 mg/L (ref 0.000–5.000)

## 2015-02-15 LAB — BASIC METABOLIC PANEL
BUN: 13 mg/dL (ref 6–23)
CO2: 27 mEq/L (ref 19–32)
Calcium: 9.6 mg/dL (ref 8.4–10.5)
Chloride: 105 mEq/L (ref 96–112)
Creatinine, Ser: 0.64 mg/dL (ref 0.40–1.20)
GFR: 100.23 mL/min (ref >60.00)
GLUCOSE: 94 mg/dL (ref 70–99)
Potassium: 4 mEq/L (ref 3.5–5.1)
SODIUM: 139 meq/L (ref 135–145)

## 2015-02-15 LAB — FERRITIN: FERRITIN: 25.7 ng/mL (ref 10.0–291.0)

## 2015-02-16 ENCOUNTER — Telehealth: Payer: Self-pay | Admitting: Physician Assistant

## 2015-02-16 DIAGNOSIS — M79604 Pain in right leg: Secondary | ICD-10-CM

## 2015-02-16 DIAGNOSIS — M79605 Pain in left leg: Principal | ICD-10-CM

## 2015-02-16 NOTE — Telephone Encounter (Signed)
-----   Message from Lindsay Hodges, [place] sent at 02/16/2015  9:09 AM EDT ----- Called and spoke with the pt and informed her of recent lab results and note.  Pt verbalized understanding and agreed to have the US done.//AB/CMA

## 2015-02-16 NOTE — Telephone Encounter (Signed)
US ordered

## 2015-03-07 ENCOUNTER — Ambulatory Visit (HOSPITAL_COMMUNITY)
Admission: RE | Admit: 2015-03-07 | Discharge: 2015-03-07 | Disposition: A | Discharge status: Home / Self Care | Payer: BLUE CROSS/BLUE SHIELD | Source: Ambulatory Visit | Attending: Surgery | Admitting: Surgery

## 2015-03-07 ENCOUNTER — Other Ambulatory Visit: Payer: Self-pay | Admitting: Physician Assistant

## 2015-03-07 ENCOUNTER — Ambulatory Visit (INDEPENDENT_AMBULATORY_CARE_PROVIDER_SITE_OTHER)
Admission: RE | Admit: 2015-03-07 | Discharge: 2015-03-07 | Disposition: A | Discharge status: Home / Self Care | Payer: BLUE CROSS/BLUE SHIELD | Source: Ambulatory Visit | Attending: Surgery | Admitting: Surgery

## 2015-03-07 DIAGNOSIS — M79605 Pain in left leg: Secondary | ICD-10-CM

## 2015-03-07 DIAGNOSIS — M79604 Pain in right leg: Secondary | ICD-10-CM | POA: Insufficient documentation

## 2015-03-07 DIAGNOSIS — I8390 Asymptomatic varicose veins of unspecified lower extremity: Secondary | ICD-10-CM

## 2015-03-07 DIAGNOSIS — I868 Varicose veins of other specified sites: Secondary | ICD-10-CM | POA: Insufficient documentation

## 2015-03-14 ENCOUNTER — Telehealth: Payer: Self-pay | Admitting: Family Medicine

## 2015-03-14 ENCOUNTER — Telehealth: Payer: Self-pay | Admitting: Physician Assistant

## 2015-03-14 DIAGNOSIS — I872 Venous insufficiency (chronic) (peripheral): Secondary | ICD-10-CM

## 2015-03-14 NOTE — Telephone Encounter (Signed)
Patient informed, understood & agreed; she would like to go forward with vascular & vein specialist now, as she states she wears compression stockings & also has some worrisome veins that she would lke to get addressed by specialist/SLS

## 2015-03-14 NOTE — Telephone Encounter (Signed)
Referral placed.

## 2015-03-14 NOTE — Telephone Encounter (Signed)
Error

## 2015-03-14 NOTE — Telephone Encounter (Signed)
Caller name: Rolinda Relation to pt: Call back number: [phone] Pharmacy:  Reason for call:   Requesting results of [place]. Saw Cody for this.

## 2015-03-14 NOTE — Telephone Encounter (Signed)
Results not sent to me but abstracted into EMR from vascular. I have reviewed. ABI normal. [place] negative for clots but does show venous reflux.  Encourage knee length compression stockings daily.  If symptoms not improving or worsening, should refer to Vascular surgery.

## 2015-03-16 ENCOUNTER — Ambulatory Visit (INDEPENDENT_AMBULATORY_CARE_PROVIDER_SITE_OTHER): Payer: BLUE CROSS/BLUE SHIELD | Admitting: Physician Assistant

## 2015-03-16 ENCOUNTER — Encounter: Payer: Self-pay | Admitting: Physician Assistant

## 2015-03-16 VITALS — BP 128/62 | HR 76 | Temp 97.7°F | Resp 16 | Ht 65.0 in | Wt 166.1 lb

## 2015-03-16 DIAGNOSIS — R399 Unspecified symptoms and signs involving the genitourinary system: Secondary | ICD-10-CM | POA: Insufficient documentation

## 2015-03-16 LAB — POCT URINALYSIS DIPSTICK
Blood, UA: POSITIVE
Nitrite, UA: NEGATIVE
SPEC GRAV UA: 1.025
pH, UA: 6

## 2015-03-16 MED ORDER — CIPROFLOXACIN HCL 250 MG PO TABS: 250.0000 mg | ORAL_TABLET | Freq: Two times a day (BID) | ORAL | Status: DC

## 2015-03-16 NOTE — Assessment & Plan Note (Signed)
Dip + blood and LE. Will send for culture.  Will begin Cipro 250 mg BID x 3 days. Will alter regimen based on culture results.  Supportive measures reviewed with patient.

## 2015-03-16 NOTE — Patient Instructions (Signed)
Your symptoms are consistent with a bladder infection, also called acute cystitis. Please take your antibiotic (Cipro) as directed until all pills are gone.  Stay very well hydrated.  Consider a daily probiotic (Align, Culturelle, or Activia) to help prevent stomach upset caused by the antibiotic.  Taking a probiotic daily may also help prevent recurrent UTIs.  Also consider taking AZO (Phenazopyridine) tablets to help decrease pain with urination.  I will call you with your urine testing results.  We will change antibiotics if indicated.  Call or return to clinic if symptoms are not resolved by completion of antibiotic.   Urinary Tract Infection A urinary tract infection (UTI) can occur any place along the urinary tract. The tract includes the kidneys, ureters, bladder, and urethra. A type of germ called bacteria often causes a UTI. UTIs are often helped with antibiotic medicine.  HOME CARE   If given, take antibiotics as told by your doctor. Finish them even if you start to feel better.  Drink enough fluids to keep your pee (urine) clear or pale yellow.  Avoid tea, drinks with caffeine, and bubbly (carbonated) drinks.  Pee often. Avoid holding your pee in for a long time.  Pee before and after having sex (intercourse).  Wipe from front to back after you poop (bowel movement) if you are a woman. Use each tissue only once. GET HELP RIGHT AWAY IF:   You have back pain.  You have lower belly (abdominal) pain.  You have chills.  You feel sick to your stomach (nauseous).  You throw up (vomit).  Your burning or discomfort with peeing does not go away.  You have a fever.  Your symptoms are not better in 3 days. MAKE SURE YOU:   Understand these instructions.  Will watch your condition.  Will get help right away if you are not doing well or get worse. Document Released: 03/05/2008 Document Revised: 06/11/2012 Document Reviewed: 04/17/2012 [employer] Patient Information 2015  [employer]. This information is not intended to replace advice given to you by your health care provider. Make sure you discuss any questions you have with your health care provider.    

## 2015-03-16 NOTE — Progress Notes (Signed)
Patient presents to clinic today c/o 1 week of suprapubic pressure, dysuria and low back pain.  Denies fever, chills, nausea/vomiting or flank pain. Denies urinary hesitancy or frequency.  Endorses prior UTIs that start this way.  Past Medical History  Diagnosis Date  . Carotid disease, bilateral     [place] 10/15: R 40-59%, L 50-69%, stable for years  . Elevated sed rate     remote history of myelosuppressive disorder  . Hyperlipidemia   . Hypertension   . Grave's disease     s/p I-131 ablation 1990s  . Arthritis   . HYPOTHYROIDISM   . Vitamin D deficiency   . Personal history of colonic adenoma 11/30/2002    Current Outpatient Prescriptions on File Prior to Visit  Medication Sig Dispense Refill  . aspirin 81 MG tablet Take 81 mg by mouth daily.      . AVENOVA 0.01 % SOLN   0  . cycloSPORINE (RESTASIS) 0.05 % ophthalmic emulsion Place 1 drop into both eyes as needed.  0.4 mL   . levothyroxine (SYNTHROID) 75 MCG tablet Take 1 tablet (75 mcg total) by mouth daily. Except Saturdays 90 tablet 3  . losartan (COZAAR) 50 MG tablet Take 1 tablet (50 mg total) by mouth daily. 90 tablet 3  . SYNTHROID 50 MCG tablet TAKE 1 TABLET BY MOUTH ON SATURDAYS ONLY 30 tablet 3   No current facility-administered medications on file prior to visit.    Allergies  Allergen Reactions  . Penicillins     REACTION: swelling/dyspnea    Family History  Problem Relation Age of Onset  . Hypertension Mother   . Arthritis Mother   . Hyperlipidemia Mother   . Stroke Mother     1st age 76, then recurrent 65  . Atrial fibrillation Mother   . Epilepsy Mother   . Diabetes Neg Hx   . Heart attack Neg Hx   . Arthritis Father   . Breast cancer Other   . Breast cancer Sister   . Cancer Sister     Recurrent Breast CA  . Atrial fibrillation Brother   . Drug abuse Maternal Aunt     History   Social History  . Marital Status: Married    Spouse Name: N/A  . Number of Children: N/A  . Years of Education:  N/A   Occupational History  .      [employer]   Social History Main Topics  . Smoking status: Former Smoker -- 0.50 packs/day for 12 years    Quit date: 02/06/1993  . Smokeless tobacco: Never Used  . Alcohol Use: No  . Drug Use: No  . Sexual Activity: Yes   Other Topics Concern  . 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  All other ROS are negative.  BP 128/62 mmHg  Pulse 76  Temp(Src) 97.7 F (36.5 C) (Oral)  Resp 16  Ht 5\' 5"  (1.651 m)  Wt 166 lb 2 oz (75.354 kg)  BMI 27.64 kg/m2  SpO2 98%  Physical Exam  Constitutional: She is oriented to person, place, and time and well-developed, well-nourished, and in no distress.  HENT:  Head: Normocephalic and atraumatic.  Cardiovascular: Normal rate, regular rhythm, normal heart sounds and intact distal pulses.   Pulmonary/Chest: Effort normal and breath sounds normal. No respiratory distress. She has no wheezes. She has no rales. She exhibits no tenderness.  Neurological: She is alert  and oriented to person, place, and time.  Skin: Skin is warm and dry. No rash noted.  Psychiatric: Affect normal.  Vitals reviewed.   Recent Results (from the past 2160 hour(s))  CBC w/Diff     Status: None   Collection Time: 02/14/15  4:13 PM  Result Value Ref Range   WBC 6.5 4.0 - 10.5 K/uL   RBC 4.46 3.87 - 5.11 Mil/uL   Hemoglobin 13.1 12.0 - 15.0 g/dL   HCT 39.8 36.0 - 46.0 %   MCV 89.3 78.0 - 100.0 fl   MCHC 33.0 30.0 - 36.0 g/dL   RDW 14.0 11.5 - 15.5 %   Platelets 220.0 150.0 - 400.0 K/uL   Neutrophils Relative % 62.6 43.0 - 77.0 %   Lymphocytes Relative 23.1 12.0 - 46.0 %   Monocytes Relative 9.6 3.0 - 12.0 %   Eosinophils Relative 4.1 0.0 - 5.0 %   Basophils Relative 0.6 0.0 - 3.0 %   Neutro Abs 4.1 1.4 - 7.7 K/uL   Lymphs Abs 1.5 0.7 - 4.0 K/uL   Monocytes Absolute 0.6 0.1 - 1.0 K/uL   Eosinophils Absolute 0.3 0.0 - 0.7 K/uL   Basophils Absolute 0.0 0.0 - 0.1 K/uL    Basic Metabolic Panel (BMET)     Status: None   Collection Time: 02/14/15  4:13 PM  Result Value Ref Range   Sodium 139 135 - 145 mEq/L   Potassium 4.0 3.5 - 5.1 mEq/L   Chloride 105 96 - 112 mEq/L   CO2 27 19 - 32 mEq/L   Glucose, Bld 94 70 - 99 mg/dL   BUN 13 6 - 23 mg/dL   Creatinine, Ser 0.64 0.40 - 1.20 mg/dL   Calcium 9.6 8.4 - 10.5 mg/dL   GFR 100.23 >60.00 mL/min  CRP High sensitivity     Status: None   Collection Time: 02/14/15  4:13 PM  Result Value Ref Range   CRP, High Sensitivity 1.410 0.000 - 5.000 mg/L    Comment: Note:  An elevated hs-CRP (>5 mg/L) should be repeated after 2 weeks to rule out recent infection or trauma.  Ferritin     Status: None   Collection Time: 02/14/15  4:13 PM  Result Value Ref Range   Ferritin 25.7 10.0 - 291.0 ng/mL  POCT urinalysis dipstick     Status: Abnormal   Collection Time: 03/16/15  1:34 PM  Result Value Ref Range   Color, UA yellow    Clarity, UA cloudy    Glucose, UA     Bilirubin, UA     Ketones, UA     Spec Grav, UA 1.025    Blood, UA positive    pH, UA 6.0    Protein, UA     Urobilinogen, UA     Nitrite, UA negative    Leukocytes, UA moderate (2+) (A) Negative    Assessment/Plan: UTI symptoms Dip + blood and LE. Will send for culture.  Will begin Cipro 250 mg BID x 3 days. Will alter regimen based on culture results.  Supportive measures reviewed with patient.

## 2015-03-16 NOTE — Progress Notes (Signed)
Pre visit review using our clinic review tool, if applicable. No additional management support is needed unless otherwise documented below in the visit note/SLS  

## 2015-03-19 LAB — CULTURE, URINE COMPREHENSIVE

## 2015-03-22 ENCOUNTER — Telehealth: Payer: Self-pay | Admitting: *Deleted

## 2015-03-22 NOTE — Telephone Encounter (Signed)
-----   Message from Brunetta Jeans, PA-C sent at 03/20/2015  8:13 PM EDT ----- Urine culture shows UTI sensitive to Ciprofloxacin given.  Should take care of infection.  Please assess for any residual symptoms.  If present we will send extended course of antibiotic to pharmacy.

## 2015-03-22 NOTE — Telephone Encounter (Signed)
Pt returned call and stated that she is out of town for a conference.  She asked if I would call and leave a detail message of her recent culture results.  Called and [facility] @ 5:00pm @ [phone]) informing the pt of her recent culture results and note below.  Informed the pt to give [place] a call back if she has any questions.//AB/CMA

## 2015-03-29 ENCOUNTER — Ambulatory Visit (INDEPENDENT_AMBULATORY_CARE_PROVIDER_SITE_OTHER): Payer: BLUE CROSS/BLUE SHIELD | Admitting: Family Medicine

## 2015-03-29 VITALS — BP 125/74 | HR 65 | Ht 65.0 in

## 2015-03-29 DIAGNOSIS — M48061 Spinal stenosis, lumbar region without neurogenic claudication: Secondary | ICD-10-CM

## 2015-03-29 DIAGNOSIS — M545 Low back pain, unspecified: Secondary | ICD-10-CM

## 2015-03-29 DIAGNOSIS — M4806 Spinal stenosis, lumbar region: Secondary | ICD-10-CM | POA: Diagnosis not present

## 2015-03-29 MED ORDER — DICLOFENAC SODIUM 75 MG PO TBEC: 75.0000 mg | DELAYED_RELEASE_TABLET | Freq: Two times a day (BID) | ORAL | Status: DC

## 2015-03-29 NOTE — Patient Instructions (Signed)
You have spinal stenosis. Take diclofenac 75mg  twice a day with food for 7-10 days then as needed. Start physical therapy and do home exercises on days you don't go to therapy. Try to avoid extending (bending backwards), prolonged standing as much as possible. Follow up with me in 5-6 weeks. Can consider MRI, injections if you're struggling.

## 2015-03-30 DIAGNOSIS — M79604 Pain in right leg: Secondary | ICD-10-CM | POA: Insufficient documentation

## 2015-03-30 DIAGNOSIS — M545 Low back pain: Secondary | ICD-10-CM | POA: Insufficient documentation

## 2015-03-30 NOTE — Assessment & Plan Note (Signed)
worse with standing, extension - consistent with spinal stenosis.  Start with diclofenac and physical therapy (flexion based program).  F/u in 5-6 weeks.  Consider imaging if not improving.

## 2015-03-30 NOTE — Progress Notes (Signed)
PCP: Penni Homans, MD  Subjective:   HPI: Patient is a 59 y.o. female here for back pain.  Patient denies known injury or trauma. About a month ago she started to get pain in knees with a pressure sensation down to feet with some tingling. Had doppler u/s on arteries and veins which were negative. Started to get back pain after this that was worse with standing and associated with the pain in her legs. Pain level up to 8/10 at night. No bowel/bladder dysfunction. No weakness of legs.  Past Medical History  Diagnosis Date  . Carotid disease, bilateral     [place] 10/15: R 40-59%, L 50-69%, stable for years  . Elevated sed rate     remote history of myelosuppressive disorder  . Hyperlipidemia   . Hypertension   . Grave's disease     s/p I-131 ablation 1990s  . Arthritis   . HYPOTHYROIDISM   . Vitamin D deficiency   . Personal history of colonic adenoma 11/30/2002    Current Outpatient Prescriptions on File Prior to Visit  Medication Sig Dispense Refill  . aspirin 81 MG tablet Take 81 mg by mouth daily.      . AVENOVA 0.01 % SOLN   0  . ciprofloxacin (CIPRO) 250 MG tablet Take 1 tablet (250 mg total) by mouth 2 (two) times daily. 6 tablet 0  . cycloSPORINE (RESTASIS) 0.05 % ophthalmic emulsion Place 1 drop into both eyes as needed.  0.4 mL   . levothyroxine (SYNTHROID) 75 MCG tablet Take 1 tablet (75 mcg total) by mouth daily. Except Saturdays 90 tablet 3  . losartan (COZAAR) 50 MG tablet Take 1 tablet (50 mg total) by mouth daily. 90 tablet 3  . SYNTHROID 50 MCG tablet TAKE 1 TABLET BY MOUTH ON SATURDAYS ONLY 30 tablet 3   No current facility-administered medications on file prior to visit.    Past Surgical History  Procedure Laterality Date  . Jaw surgery  2001  . Oophorectomy Bilateral   . Vaginal hysterectomy  2009    vaginal hysterectomy    Allergies  Allergen Reactions  . Penicillins     REACTION: swelling/dyspnea    History   Social History  . Marital  Status: Married    Spouse Name: N/A  . Number of Children: N/A  . Years of Education: N/A   Occupational History  .      [employer]   Social History Main Topics  . Smoking status: Former Smoker -- 0.50 packs/day for 12 years    Quit date: 02/06/1993  . Smokeless tobacco: Never Used  . Alcohol Use: No  . Drug Use: No  . Sexual Activity: Yes   Other Topics Concern  . Not on file   Social History Narrative   pharmacy tech III    Family History  Problem Relation Age of Onset  . Hypertension Mother   . Arthritis Mother   . Hyperlipidemia Mother   . Stroke Mother     1st age 59, then recurrent 100  . Atrial fibrillation Mother   . Epilepsy Mother   . Diabetes Neg Hx   . Heart attack Neg Hx   . Arthritis Father   . Breast cancer Other   . Breast cancer Sister   . Cancer Sister     Recurrent Breast CA  . Atrial fibrillation Brother   . Drug abuse Maternal Aunt     BP 125/74 mmHg  Pulse 65  Ht 5'  5" (1.651 m)  Review of Systems: See HPI above.    Objective:  Physical Exam:  Gen: NAD  Back: No gross deformity, scoliosis. TTP mildly left > right lumbar paraspinal regions.  No midline or bony TTP. FROM with mild pain on standing and extension. Strength LEs 5/5 all muscle groups.   2+ MSRs in patellar and achilles tendons, equal bilaterally. Negative SLRs. Sensation intact to light touch bilaterally. Negative logroll bilateral hips Negative fabers and piriformis stretches.    Assessment & Plan:  1. Low back pain radiating into legs - worse with standing, extension - consistent with spinal stenosis.  Start with diclofenac and physical therapy (flexion based program).  F/u in 5-6 weeks.  Consider imaging if not improving.

## 2015-04-14 ENCOUNTER — Ambulatory Visit: Payer: BLUE CROSS/BLUE SHIELD | Attending: Family Medicine | Admitting: Physical Therapy

## 2015-04-14 DIAGNOSIS — M545 Low back pain, unspecified: Secondary | ICD-10-CM

## 2015-04-14 NOTE — Therapy (Signed)
[facility] [address]  [place], [place], [address] Phone: [phone]   Fax:  [phone]  Physical Therapy Evaluation  Patient Details  Name: Lindsay Hodges MRN: [id number] Date of Birth: Feb 11, 1956 Referring Provider:  Dene Gentry, MD  Encounter Date: 04/14/2015      PT End of Session - 04/14/15 1723    Visit Number 1   Number of Visits 8   Date for PT Re-Evaluation 05/12/15   PT Start Time 1618   PT Stop Time 1705   PT Time Calculation (min) 47 min   Activity Tolerance Patient tolerated treatment well   Behavior During Therapy [facility] for tasks assessed/performed      Past Medical History  Diagnosis Date  . Carotid disease, bilateral     [place] 10/15: R 40-59%, L 50-69%, stable for years  . Elevated sed rate     remote history of myelosuppressive disorder  . Hyperlipidemia   . Hypertension   . Grave's disease     s/p I-131 ablation 1990s  . Arthritis   . HYPOTHYROIDISM   . Vitamin D deficiency   . Personal history of colonic adenoma 11/30/2002    Past Surgical History  Procedure Laterality Date  . Jaw surgery  2001  . Oophorectomy Bilateral   . Vaginal hysterectomy  2009    vaginal hysterectomy    There were no vitals filed for this visit.  Visit Diagnosis:  Right-sided low back pain without sciatica - Plan: PT plan of care cert/re-cert      Subjective Assessment - 04/14/15 1626    Subjective Patient reports low back pain starting about 6 wks ago which she attributed to a UTI. Took a course of Cipro and UTI resolved but back pain persisted. Saw MD who indicated back pain may be coming from stenosis but no diagnostic testing ordered. Patient mentioned sensation of tightness/constriction in calf area for which she had vascular imaging which per patient was normal.   Limitations Standing   How long can you sit comfortably? unlimited    How long can you stand comfortably? 10 minutes   How long  can you walk comfortably? unlimited   Currently in Pain? Yes   Pain Score 0-No pain  no pain, but reports "discomfort" - Least 0/10, Avg 6/10, Worst 7/10   Pain Location Back   Pain Orientation Right;Lower   Pain Descriptors / Indicators Aching   Pain Onset More than a month ago  ~6 weeks ago   Pain Frequency Several days a week   Aggravating Factors  Standing still   Pain Relieving Factors Voltaren, heat, Ibuprofen            [facility] PT Assessment - 04/14/15 0001    Assessment   Medical Diagnosis Spinal stenosis - low back pain   Onset Date/Surgical Date 03/03/15   Next MD Visit as needed   Prior Therapy none   Balance Screen   Has the patient fallen in the past 6 months No   Has the patient had a decrease in activity level because of a fear of falling?  No   Is the patient reluctant to leave their home because of a fear of falling?  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, standing, walking   Leisure Gardening, walking up to 5 miles/day   Cognition   Overall Cognitive Status  Within Functional Limits for tasks assessed   Observation/Other Assessments   Focus on Therapeutic Outcomes (FOTO)  83% (17% limited); predicted 83% (17% limited)   ROM / Strength   AROM / PROM / Strength AROM   AROM   Overall AROM  Within functional limits for tasks performed   AROM Assessment Site Lumbar   Flexibility   Hamstrings WFL   ITB positive Ober test with mild tightness bilaterally   Piriformis positive FAIR test on left   Transfers   Transfers Independent with all Transfers   Ambulation/Gait   Ambulation/Gait Assistance 7: Independent        Today's Treatment  HEP instruction: ITB stretch 15-20 sec x3 Piriformis stretch 15-20 sec x3 Pelvic tilt x10, 5 sec hold          PT Education - 04/14/15 1722    Education provided Yes   Education Details Initial HEP   Person(s)  Educated Patient   Methods Explanation;Demonstration;Handout   Comprehension Verbalized understanding;Returned demonstration;Need further instruction             PT Long Term Goals - 04/14/15 1740    PT LONG TERM GOAL #1   Title Independent with HEP (05/12/15)   Time 4   Period Weeks   Status New   PT LONG TERM GOAL #2   Title Patient will be able to stand >20 min without increased pain   Time 4   Period Weeks   Status New               Plan - 04/14/15 1724    Clinical Impression Statement Patient referred for OPPT for low back pain from spinal stenosis. Patient without pain at time of eval and unable to reproduce pain during assessment, but tightness noted in proximal LE musculature (ITB and piriformis) which could be contributing to low back pain.    Pt will benefit from skilled therapeutic intervention in order to improve on the following deficits Pain;Impaired flexibility;Decreased activity tolerance;Improper body mechanics   Rehab Potential Good   PT Frequency 2x / week   PT Duration 4 weeks   PT Treatment/Interventions Therapeutic exercise;Therapeutic activities;Moist Heat;Cryotherapy;Electrical Stimulation;Ultrasound;Manual techniques;Patient/family education   PT Next Visit Plan Review of HEP, core strengthening, body mechanics training, assess for SI dysfunction   Consulted and Agree with Plan of Care Patient         Problem List Patient Active Problem List   Diagnosis Date Noted  . Low back pain radiating to both legs 03/30/2015  . UTI symptoms 03/16/2015  . Bilateral leg pain 02/14/2015  . Right knee pain 03/26/2014  . Seasonal affective disorder 11/25/2012  . Vitamin D deficiency 02/14/2012  . Hyperlipidemia   . Hypertension   . Grave's disease   . Arthritis   . Carotid disease, bilateral   . CAROTID BRUIT, LEFT 04/11/2009  . Hypothyroidism 01/06/2009  . Personal history of colonic adenoma 11/30/2002    Percival Spanish, PT, MPT 04/14/2015,  6:08 PM  [facility] [address]  [place], [place], [address] Phone: [phone]   Fax:  [phone]

## 2015-04-21 ENCOUNTER — Ambulatory Visit: Payer: BLUE CROSS/BLUE SHIELD | Admitting: Physical Therapy

## 2015-04-21 DIAGNOSIS — M545 Low back pain, unspecified: Secondary | ICD-10-CM

## 2015-04-21 NOTE — Therapy (Addendum)
[facility] [address]  [place], [place], [address] Phone: [phone]   Fax:  [phone]  Physical Therapy Treatment  Patient Details  Name: Lindsay Hodges MRN: [id number] Date of Birth: November 13, 1955 Referring Provider:  Dene Gentry, MD  Encounter Date: 04/21/2015      PT End of Session - 04/21/15 1700    Visit Number 2   Number of Visits 8   Date for PT Re-Evaluation 05/12/15   PT Start Time 5916   PT Stop Time 1731   PT Time Calculation (min) 41 min   Activity Tolerance Patient tolerated treatment well;No increased pain   Behavior During Therapy [facility] for tasks assessed/performed      Past Medical History  Diagnosis Date  . Carotid disease, bilateral     [place] 10/15: R 40-59%, L 50-69%, stable for years  . Elevated sed rate     remote history of myelosuppressive disorder  . Hyperlipidemia   . Hypertension   . Grave's disease     s/p I-131 ablation 1990s  . Arthritis   . HYPOTHYROIDISM   . Vitamin D deficiency   . Personal history of colonic adenoma 11/30/2002    Past Surgical History  Procedure Laterality Date  . Jaw surgery  2001  . Oophorectomy Bilateral   . Vaginal hysterectomy  2009    vaginal hysterectomy    There were no vitals filed for this visit.  Visit Diagnosis:  Right-sided low back pain without sciatica      Subjective Assessment - 04/21/15 1652    Subjective Patient reporting completing HEP without problems. Stated 1 episode on Saturday of intense pain in anterior left shin, but otherwise just has some tightness.   Currently in Pain? No/denies   Multiple Pain Sites No                 [facility] Adult PT Treatment/Exercise - 04/21/15 1702    Exercises   Exercises Lumbar   Lumbar Exercises: Stretches   Passive Hamstring Stretch 20 seconds   Passive Hamstring Stretch Limitations with strap   ITB Stretch 20 seconds;3 reps   ITB Stretch Limitations with knee flexed   Piriformis Stretch 20 seconds;3 reps   Lumbar Exercises: Aerobic   Stationary Bike NuStep lvl 3 x 5 min   Lumbar Exercises: Supine   Ab Set 5 seconds;10 reps   Bent Knee Raise 10 reps;3 seconds   Bent Knee Raise Limitations alternating LEs   Bridge 10 reps;3 seconds   Lumbar Exercises: Quadruped   Madcat/Old Horse 10 reps   Ankle Exercises: Stretches   Soleus Stretch 20 seconds;3 reps   Soleus Stretch Limitations patient instructed in seated & standing options to complete stretch   Gastroc Stretch 20 seconds;3 reps   Gastroc Stretch Limitations patient instructed in seated and standing options for completing stretch                PT Education - 04/21/15 1735    Education Details Addition tp HEP   Person(s) Educated Patient   Methods Explanation;Demonstration;Handout   Comprehension Verbalized understanding;Returned demonstration;Need further instruction             PT Long Term Goals - 04/21/15 1740    PT LONG TERM GOAL #1   Title Independent with HEP (05/12/15)   Status On-going   PT LONG TERM GOAL #2   Title Patient will be able to stand >20 min without increased pain   Status  On-going               Plan - 04/21/15 1737    Clinical Impression Statement Patient remains without pain except one isolated episode of left shin pain over weekend when  laying down to rest at end of day. Patient reporting good compliance with HEP with additional stretching and core flexibility/stability exercises added. Patient able to perform all exercises wihout pain.   Consulted and Agree with Plan of Care Patient        Problem List Patient Active Problem List   Diagnosis Date Noted  . Low back pain radiating to both legs 03/30/2015  . UTI symptoms 03/16/2015  . Bilateral leg pain 02/14/2015  . Right knee pain 03/26/2014  . Seasonal affective disorder 11/25/2012  . Vitamin D deficiency 02/14/2012  . Hyperlipidemia   . Hypertension   . Grave's disease   .  Arthritis   . Carotid disease, bilateral   . CAROTID BRUIT, LEFT 04/11/2009  . Hypothyroidism 01/06/2009  . Personal history of colonic adenoma 11/30/2002    Percival Spanish, PT, MPT 04/21/2015, 5:45 PM  [facility] [address]  [place], [place], [address] Phone: [phone]   Fax:  [phone]     PHYSICAL THERAPY DISCHARGE SUMMARY  Visits from Start of Care: 2 of 8  Current functional level related to goals / functional outcomes:  Patient compliant with HEP with limited reports of pain, although only seen for one visit after then initial eval. Patient requested to hold therapy until she saw the MD at the end of August. As of last contact with patient on 06/02/15, she was still waiting to see the pain doctor and requested to be discharged from PT. Goal met for independence with HEP; unable to assess standing tolerance goal secondary to failure to return to PT.   Remaining deficits:  Unable to assess secondary not returning to PT.   Education / Equipment:  HEP Plan: Patient agrees to discharge.  Patient goals were partially met. Patient is being discharged due to the patient's request.  ?????       Percival Spanish, PT, MPT 06/07/2015, 10:10 AM  [facility] [address]  [place], [place], [address] Phone: [phone]   Fax:  [phone]

## 2015-04-26 ENCOUNTER — Ambulatory Visit: Payer: BLUE CROSS/BLUE SHIELD | Admitting: Physical Therapy

## 2015-05-06 ENCOUNTER — Encounter: Payer: BLUE CROSS/BLUE SHIELD | Admitting: Vascular Surgery

## 2015-05-25 ENCOUNTER — Encounter: Payer: BLUE CROSS/BLUE SHIELD | Admitting: Vascular Surgery

## 2015-06-27 ENCOUNTER — Ambulatory Visit (INDEPENDENT_AMBULATORY_CARE_PROVIDER_SITE_OTHER): Payer: BLUE CROSS/BLUE SHIELD | Admitting: Family

## 2015-06-27 ENCOUNTER — Encounter: Payer: Self-pay | Admitting: Family

## 2015-06-27 VITALS — BP 130/66 | HR 77 | Temp 98.0°F | Resp 16 | Ht 65.0 in | Wt 164.8 lb

## 2015-06-27 DIAGNOSIS — R3915 Urgency of urination: Secondary | ICD-10-CM

## 2015-06-27 LAB — POCT URINALYSIS DIPSTICK
BILIRUBIN UA: NEGATIVE
Glucose, UA: NEGATIVE
KETONES UA: NEGATIVE
Nitrite, UA: NEGATIVE
PH UA: 6
Protein, UA: NEGATIVE
RBC UA: NEGATIVE
SPEC GRAV UA: 1.025
Urobilinogen, UA: NEGATIVE

## 2015-06-27 MED ORDER — CIPROFLOXACIN HCL 250 MG PO TABS: 250.0000 mg | ORAL_TABLET | Freq: Two times a day (BID) | ORAL | Status: DC

## 2015-06-27 NOTE — Progress Notes (Signed)
Subjective:    Patient ID: Lindsay Hodges, female    DOB: July 26, 1956, 59 y.o.   MRN: [id number]  HPI  Lindsay Hodges is a 59 yr old female who presents today with chief complaint of urinary urgency. Symptoms began last week. Symptoms improved with AZO. + associated hematuria yesterday. Reports that she has had some chills.   Reports some low back pain.     Review of Systems See HPI  Past Medical History  Diagnosis Date  . Carotid disease, bilateral     [place] 10/15: R 40-59%, L 50-69%, stable for years  . Elevated sed rate     remote history of myelosuppressive disorder  . Hyperlipidemia   . Hypertension   . Grave's disease     s/p I-131 ablation 1990s  . Arthritis   . HYPOTHYROIDISM   . Vitamin D deficiency   . Personal history of colonic adenoma 11/30/2002    Social History   Social History  . Marital Status: Married    Spouse Name: N/A  . Number of Children: N/A  . Years of Education: N/A   Occupational History  .      [employer]   Social History Main Topics  . Smoking status: Former Smoker -- 0.50 packs/day for 12 years    Quit date: 02/06/1993  . Smokeless tobacco: Never Used  . Alcohol Use: No  . Drug Use: No  . Sexual Activity: Yes   Other Topics Concern  . Not on file   Social History Narrative   pharmacy tech III    Past Surgical History  Procedure Laterality Date  . Jaw surgery  2001  . Oophorectomy Bilateral   . Vaginal hysterectomy  2009    vaginal hysterectomy    Family History  Problem Relation Age of Onset  . Hypertension Mother   . Arthritis Mother   . Hyperlipidemia Mother   . Stroke Mother     1st age 9, then recurrent 32  . Atrial fibrillation Mother   . Epilepsy Mother   . Diabetes Neg Hx   . Heart attack Neg Hx   . Arthritis Father   . Breast cancer Other   . Breast cancer Sister   . Cancer Sister     Recurrent Breast CA  . Atrial fibrillation Brother   . Drug abuse Maternal Aunt     Allergies  Allergen  Reactions  . Penicillins     REACTION: swelling/dyspnea    Current Outpatient Prescriptions on File Prior to Visit  Medication Sig Dispense Refill  . aspirin 81 MG tablet Take 81 mg by mouth daily.      . AVENOVA 0.01 % SOLN   0  . cycloSPORINE (RESTASIS) 0.05 % ophthalmic emulsion Place 1 drop into both eyes as needed.  0.4 mL   . levothyroxine (SYNTHROID) 75 MCG tablet Take 1 tablet (75 mcg total) by mouth daily. Except Saturdays 90 tablet 3  . losartan (COZAAR) 50 MG tablet Take 1 tablet (50 mg total) by mouth daily. 90 tablet 3  . SYNTHROID 50 MCG tablet TAKE 1 TABLET BY MOUTH ON SATURDAYS ONLY 30 tablet 3   No current facility-administered medications on file prior to visit.    BP 130/66 mmHg  Pulse 77  Temp(Src) 98 F (36.7 C) (Oral)  Resp 16  Ht 5\' 5"  (1.651 m)  Wt 164 lb 12.8 oz (74.753 kg)  BMI 27.42 kg/m2  SpO2 97%       Objective:  Physical Exam  Constitutional: She is oriented to person, place, and time. She appears well-developed and well-nourished.  Cardiovascular: Normal rate, regular rhythm and normal heart sounds.   No murmur heard. Pulmonary/Chest: Effort normal and breath sounds normal. No respiratory distress. She has no wheezes.  Abdominal: Soft. She exhibits no distension. There is no tenderness. There is no CVA tenderness.  Mild suprapubic tenderness to palpation  Musculoskeletal: She exhibits no edema.  Neurological: She is alert and oriented to person, place, and time.  Skin: Skin is warm and dry.  Psychiatric: She has a normal mood and affect. Her behavior is normal. Judgment and thought content normal.          Assessment & Plan:  UTI-  UA notes 1+ leukocytes. Send Urine for culture. Rx with cipro. Pt advised to call if symptoms worsen or do not improve.

## 2015-06-27 NOTE — Progress Notes (Signed)
Pre visit review using our clinic review tool, if applicable. No additional management support is needed unless otherwise documented below in the visit note. 

## 2015-06-27 NOTE — Patient Instructions (Signed)
Start cipro for your urinary tract infection. Call if symptoms worsen or if not improved in 3 days.

## 2015-06-30 LAB — URINE CULTURE: Colony Count: 80000

## 2015-07-01 ENCOUNTER — Encounter: Payer: Self-pay | Admitting: Family

## 2015-07-25 NOTE — Progress Notes (Signed)
      HPI: FU cerebrovascular disease. Echocardiogram in 2004 showed normal LV function. She had a stress myoview in 2010 with positive ECG and normal images. F/U myoview 10/15/11 with positive ECG but normal images also. Last carotid Dopplers in Oct 2015 showed 50-69% left and <50% right stenosis. Followup recommended in one year. ABIs 6/16 normal. Since last seen, She denies dyspnea, chest pain, palpitations or syncope.  Current Outpatient Prescriptions  Medication Sig Dispense Refill  . aspirin 81 MG tablet Take 81 mg by mouth daily.      . AVENOVA 0.01 % SOLN   0  . cycloSPORINE (RESTASIS) 0.05 % ophthalmic emulsion Place 1 drop into both eyes as needed.  0.4 mL   . levothyroxine (SYNTHROID) 75 MCG tablet Take 1 tablet (75 mcg total) by mouth daily. Except Saturdays 90 tablet 3  . losartan (COZAAR) 50 MG tablet Take 1 tablet (50 mg total) by mouth daily. 90 tablet 3  . SYNTHROID 50 MCG tablet TAKE 1 TABLET BY MOUTH ON SATURDAYS ONLY 30 tablet 3   No current facility-administered medications for this visit.     Past Medical History  Diagnosis Date  . Carotid disease, bilateral ([place])     [place] 10/15: R 40-59%, L 50-69%, stable for years  . Elevated sed rate     remote history of myelosuppressive disorder  . Hyperlipidemia   . Hypertension   . Grave's disease     s/p I-131 ablation 1990s  . Arthritis   . HYPOTHYROIDISM   . Vitamin D deficiency   . Personal history of colonic adenoma 11/30/2002    Past Surgical History  Procedure Laterality Date  . Jaw surgery  2001  . Oophorectomy Bilateral   . Vaginal hysterectomy  2009    vaginal hysterectomy    Social History   Social History  . Marital Status: Married    Spouse Name: N/A  . Number of Children: N/A  . Years of Education: N/A   Occupational History  .      [employer]   Social History Main Topics  . Smoking status: Former Smoker -- 0.50 packs/day for 12 years    Quit date: 02/06/1993  . Smokeless  tobacco: Never Used  . Alcohol Use: No  . Drug Use: No  . Sexual Activity: Yes   Other Topics Concern  . Not on file   Social History Narrative   pharmacy tech III    ROS: Bilateral lower extremity pain both at rest and with exertion particularly right hip but no fevers or chills, productive cough, hemoptysis, dysphasia, odynophagia, melena, hematochezia, dysuria, hematuria, rash, seizure activity, orthopnea, PND, pedal edema. Remaining systems are negative.  Physical Exam: Well-developed well-nourished in no acute distress.  Skin is warm and dry.  HEENT is normal.  Neck is supple.  Chest is clear to auscultation with normal expansion.  Cardiovascular exam is regular rate and rhythm.  Abdominal exam nontender or distended. No masses palpated. Extremities show no edema. neuro grossly intact  ECG Normal sinus rhythm, no ST changes.

## 2015-07-27 ENCOUNTER — Encounter: Payer: Self-pay | Admitting: Cardiology

## 2015-07-27 ENCOUNTER — Ambulatory Visit (INDEPENDENT_AMBULATORY_CARE_PROVIDER_SITE_OTHER): Payer: BLUE CROSS/BLUE SHIELD | Admitting: Cardiology

## 2015-07-27 VITALS — BP 150/74 | Ht 65.0 in | Wt 162.1 lb

## 2015-07-27 DIAGNOSIS — I779 Disorder of arteries and arterioles, unspecified: Secondary | ICD-10-CM

## 2015-07-27 DIAGNOSIS — I10 Essential (primary) hypertension: Secondary | ICD-10-CM | POA: Diagnosis not present

## 2015-07-27 DIAGNOSIS — I739 Peripheral vascular disease, unspecified: Principal | ICD-10-CM

## 2015-07-27 DIAGNOSIS — E785 Hyperlipidemia, unspecified: Secondary | ICD-10-CM | POA: Diagnosis not present

## 2015-07-27 MED ORDER — EZETIMIBE 10 MG PO TABS
10.0000 mg | ORAL_TABLET | Freq: Every day | ORAL | Status: DC
Start: 2015-07-27 — End: 2015-09-27

## 2015-07-27 NOTE — Assessment & Plan Note (Signed)
Patient has not tolerated statins. I will try Zetia 10 mg daily. Check lipids and liver in 4 weeks.

## 2015-07-27 NOTE — Patient Instructions (Signed)
Medication Instructions:   START ZETIA 10 MG ONCE DAILY  Labwork:  Your physician recommends that you return for lab work in:4 WEEKS=FASTING  Testing/Procedures:  Your physician has requested that you have a carotid duplex. This test is an ultrasound of the carotid arteries in your neck. It looks at blood flow through these arteries that supply the brain with blood. Allow one hour for this exam. There are no restrictions or special instructions.    Follow-Up:  Your physician wants you to follow-up in: [place] will receive a reminder letter in the mail two months in advance. If you don't receive a letter, please call our office to schedule the follow-up appointment.   If you need a refill on your cardiac medications before your next appointment, please call your pharmacy.

## 2015-07-27 NOTE — Assessment & Plan Note (Addendum)
Blood pressure mildly elevated but she follows this at home and typically control. Continue present medications and follow.

## 2015-07-27 NOTE — Assessment & Plan Note (Signed)
Schedule follow-up carotid Dopplers. 

## 2015-07-29 ENCOUNTER — Ambulatory Visit (HOSPITAL_BASED_OUTPATIENT_CLINIC_OR_DEPARTMENT_OTHER)
Admission: RE | Admit: 2015-07-29 | Discharge: 2015-07-29 | Disposition: A | Discharge status: Home / Self Care | Payer: BLUE CROSS/BLUE SHIELD | Source: Ambulatory Visit | Attending: Cardiology | Admitting: Cardiology

## 2015-07-29 DIAGNOSIS — I739 Peripheral vascular disease, unspecified: Secondary | ICD-10-CM

## 2015-07-29 DIAGNOSIS — Z87891 Personal history of nicotine dependence: Secondary | ICD-10-CM | POA: Diagnosis not present

## 2015-07-29 DIAGNOSIS — I779 Disorder of arteries and arterioles, unspecified: Secondary | ICD-10-CM | POA: Insufficient documentation

## 2015-08-01 ENCOUNTER — Telehealth: Payer: Self-pay | Admitting: Cardiology

## 2015-08-01 NOTE — Telephone Encounter (Signed)
Pt called in stating that she received a call from our office . She believes its about a Carotid duplex she had on 10/28. Please f/u   Thanks

## 2015-08-01 NOTE — Telephone Encounter (Signed)
LM that results have been released to MyChart - appears patient has already viewed the results in MyChart as well.

## 2015-08-02 ENCOUNTER — Encounter: Payer: Self-pay | Admitting: Sports Medicine

## 2015-08-02 ENCOUNTER — Ambulatory Visit (INDEPENDENT_AMBULATORY_CARE_PROVIDER_SITE_OTHER): Payer: BLUE CROSS/BLUE SHIELD | Admitting: Sports Medicine

## 2015-08-02 ENCOUNTER — Ambulatory Visit (INDEPENDENT_AMBULATORY_CARE_PROVIDER_SITE_OTHER): Payer: BLUE CROSS/BLUE SHIELD

## 2015-08-02 DIAGNOSIS — M7061 Trochanteric bursitis, right hip: Secondary | ICD-10-CM | POA: Diagnosis not present

## 2015-08-02 DIAGNOSIS — M7070 Other bursitis of hip, unspecified hip: Secondary | ICD-10-CM

## 2015-08-02 DIAGNOSIS — M25551 Pain in right hip: Secondary | ICD-10-CM

## 2015-08-02 DIAGNOSIS — M5416 Radiculopathy, lumbar region: Secondary | ICD-10-CM | POA: Diagnosis not present

## 2015-08-02 HISTORY — DX: Other bursitis of hip, unspecified hip: M70.70

## 2015-08-02 MED ORDER — MELOXICAM 15 MG PO TABS: ORAL_TABLET | ORAL | Status: DC

## 2015-08-02 NOTE — Progress Notes (Signed)
   Subjective:    I'm seeing this patient as a consultation for:  Dr. Vivien Rossetti  CC: right hip pain  HPI: This is a pleasant 59 year old female with a long history of right hip pain, she endorse pain that she localizes over the lateral aspect of the hip, moderate, persistent, occasional radiation down the lateral thigh, she gets an additional and different paresthesia-type pain into the dorsum of the leg and foot. In addition she does have pain that she localizes deep in the groin.  Past medical history, Surgical history, Family history not pertinant except as noted below, Social history, Allergies, and medications have been entered into the medical record, reviewed, and no changes needed.   Review of Systems: No headache, visual changes, nausea, vomiting, diarrhea, constipation, dizziness, abdominal pain, skin rash, fevers, chills, night sweats, weight loss, swollen lymph nodes, body aches, joint swelling, muscle aches, chest pain, shortness of breath, mood changes, visual or auditory hallucinations.   Objective:   General: Well Developed, well nourished, and in no acute distress.  Neuro/Psych: Alert and oriented x3, extra-ocular muscles intact, able to move all 4 extremities, sensation grossly intact. Skin: Warm and dry, no rashes noted.  Respiratory: Not using accessory muscles, speaking in full sentences, trachea midline.  Cardiovascular: Pulses palpable, no extremity edema. Abdomen: Does not appear distended. Right Hip: ROM IR: 60 Deg, ER: 60 Deg, Flexion: 120 Deg, Extension: 100 Deg, Abduction: 45 Deg, Adduction: 45 Deg Strength IR: 5/5, ER: 5/5, Flexion: 5/5, Extension: 5/5, Abduction: 5/5, Adduction: 5/5 Pelvic alignment unremarkable to inspection and palpation. Standing hip rotation and gait without trendelenburg / unsteadiness. Greater trochanter with severe tenderness to palpation. No tenderness over piriformis. No SI joint tenderness and normal minimal SI  movement. Internal rotation creates pain.  Procedure:  Injection of right trochanteric bursa Consent obtained and verified. Time-out conducted. Noted no overlying erythema, induration, or other signs of local infection. Skin prepped in a sterile fashion. Topical analgesic spray: Ethyl chloride. Completed without difficulty. Meds:  using a 22-gauge spinal needle I advanced towards the greater trochanter and injected total of 1 mL kenalog 40, 4 mL lidocaine. Pain immediately improved suggesting accurate placement of the medication. Advised to call if fevers/chills, erythema, induration, drainage, or persistent bleeding.  Impression and Recommendations:   This case required medical decision making of moderate complexity.

## 2015-08-02 NOTE — Assessment & Plan Note (Signed)
Injection as above, home rehabilitation exercises, mobile, x-rays. She also has a multitude of other symptoms including minimal pain referable to the joint, as well as mild right-sided L5 radiculopathy. We will get x-rays of the lumbar spine and hip, and she can return to see me in one month to see up undergoing, we can knock out the hip joint versus radiculopathy depending on which one is worse at her return visit.

## 2015-08-23 ENCOUNTER — Other Ambulatory Visit: Payer: Self-pay | Admitting: Family Medicine

## 2015-08-30 ENCOUNTER — Ambulatory Visit (INDEPENDENT_AMBULATORY_CARE_PROVIDER_SITE_OTHER): Payer: BLUE CROSS/BLUE SHIELD | Admitting: Sports Medicine

## 2015-08-30 ENCOUNTER — Encounter: Payer: Self-pay | Admitting: Sports Medicine

## 2015-08-30 VITALS — BP 150/73 | HR 68 | Wt 160.0 lb

## 2015-08-30 DIAGNOSIS — M7061 Trochanteric bursitis, right hip: Secondary | ICD-10-CM | POA: Diagnosis not present

## 2015-08-30 DIAGNOSIS — M79605 Pain in left leg: Secondary | ICD-10-CM

## 2015-08-30 DIAGNOSIS — M545 Low back pain, unspecified: Secondary | ICD-10-CM

## 2015-08-30 DIAGNOSIS — M25561 Pain in right knee: Secondary | ICD-10-CM

## 2015-08-30 NOTE — Progress Notes (Signed)
  Subjective:    CC: Follow-up  HPI: Trochanteric bursitis: Resolved after injection  Numbness and tingling legs: X-rays did show widespread lumbar degenerative disc disease likely responsible for her leg paresthesias. She does tell me that this seemed to go away with the meloxicam.  Right knee pain: Medial joint line, present for a year. No mechanical symptoms, only minimal improvement with meloxicam.  Past medical history, Surgical history, Family history not pertinant except as noted below, Social history, Allergies, and medications have been entered into the medical record, reviewed, and no changes needed.   Review of Systems: No fevers, chills, night sweats, weight loss, chest pain, or shortness of breath.   Objective:    General: Well Developed, well nourished, and in no acute distress.  Neuro: Alert and oriented x3, extra-ocular muscles intact, sensation grossly intact.  HEENT: Normocephalic, atraumatic, pupils equal round reactive to light, neck supple, no masses, no lymphadenopathy, thyroid nonpalpable.  Skin: Warm and dry, no rashes. Cardiac: Regular rate and rhythm, no murmurs rubs or gallops, no lower extremity edema.  Respiratory: Clear to auscultation bilaterally. Not using accessory muscles, speaking in full sentences. Right Knee: Normal to inspection with no erythema or effusion or obvious bony abnormalities. Tender to palpation at the medial joint line ROM normal in flexion and extension and lower leg rotation. Ligaments with solid consistent endpoints including ACL, PCL, LCL, MCL. Negative Mcmurray's and provocative meniscal tests. Non painful patellar compression. Patellar and quadriceps tendons unremarkable. Hamstring and quadriceps strength is normal.  Procedure: Real-time Ultrasound Guided Injection of right knee Device: GE Logiq E  Verbal informed consent obtained.  Time-out conducted.  Noted no overlying erythema, induration, or other signs of local  infection.  Skin prepped in a sterile fashion.  Local anesthesia: Topical Ethyl chloride.  With sterile technique and under real time ultrasound guidance:  1 mL kenalog 40, 2 mL Marcaine, 2 mL lidocaine injected easily. Completed without difficulty  Pain immediately resolved suggesting accurate placement of the medication.  Advised to call if fevers/chills, erythema, induration, drainage, or persistent bleeding.  Images permanently stored and available for review in the ultrasound unit.  Impression: Technically successful ultrasound guided injection.  Impression and Recommendations:

## 2015-08-30 NOTE — Assessment & Plan Note (Signed)
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

## 2015-08-30 NOTE — Assessment & Plan Note (Signed)
There is multilevel degenerative disc disease with bilateral radicular symptoms. This seemed to go away with meloxicam.

## 2015-08-30 NOTE — Assessment & Plan Note (Signed)
Resolved after injection. She does have a bit of pain which does well with meloxicam.

## 2015-08-31 ENCOUNTER — Telehealth: Payer: Self-pay | Admitting: Behavioral Health

## 2015-08-31 ENCOUNTER — Encounter: Payer: Self-pay | Admitting: Behavioral Health

## 2015-08-31 LAB — HEPATIC FUNCTION PANEL
ALK PHOS: 67 U/L (ref 33–130)
ALT: 13 U/L (ref 6–29)
AST: 16 U/L (ref 10–35)
Albumin: 3.8 g/dL (ref 3.6–5.1)
BILIRUBIN DIRECT: 0.1 mg/dL (ref <=0.2)
BILIRUBIN INDIRECT: 0.4 mg/dL (ref 0.2–1.2)
BILIRUBIN TOTAL: 0.5 mg/dL (ref 0.2–1.2)
Total Protein: 6.6 g/dL (ref 6.1–8.1)

## 2015-08-31 NOTE — Telephone Encounter (Signed)
Pre-Visit Call completed with patient and chart updated.   Pre-Visit Info documented in Specialty Comments under [employer].    

## 2015-09-01 ENCOUNTER — Encounter: Payer: Self-pay | Admitting: Family Medicine

## 2015-09-01 ENCOUNTER — Ambulatory Visit (INDEPENDENT_AMBULATORY_CARE_PROVIDER_SITE_OTHER): Payer: BLUE CROSS/BLUE SHIELD | Admitting: Family Medicine

## 2015-09-01 ENCOUNTER — Encounter: Payer: BLUE CROSS/BLUE SHIELD | Admitting: Family Medicine

## 2015-09-01 VITALS — BP 122/76 | HR 69 | Temp 98.1°F | Ht 65.0 in | Wt 159.4 lb

## 2015-09-01 DIAGNOSIS — Z1239 Encounter for other screening for malignant neoplasm of breast: Secondary | ICD-10-CM | POA: Diagnosis not present

## 2015-09-01 DIAGNOSIS — E0500 Thyrotoxicosis with diffuse goiter without thyrotoxic crisis or storm: Secondary | ICD-10-CM

## 2015-09-01 DIAGNOSIS — E785 Hyperlipidemia, unspecified: Secondary | ICD-10-CM

## 2015-09-01 DIAGNOSIS — Z1159 Encounter for screening for other viral diseases: Secondary | ICD-10-CM | POA: Diagnosis not present

## 2015-09-01 DIAGNOSIS — E039 Hypothyroidism, unspecified: Secondary | ICD-10-CM

## 2015-09-01 DIAGNOSIS — M7061 Trochanteric bursitis, right hip: Secondary | ICD-10-CM

## 2015-09-01 DIAGNOSIS — E559 Vitamin D deficiency, unspecified: Secondary | ICD-10-CM

## 2015-09-01 DIAGNOSIS — I10 Essential (primary) hypertension: Secondary | ICD-10-CM | POA: Diagnosis not present

## 2015-09-01 DIAGNOSIS — Z0000 Encounter for general adult medical examination without abnormal findings: Secondary | ICD-10-CM | POA: Diagnosis not present

## 2015-09-01 DIAGNOSIS — L578 Other skin changes due to chronic exposure to nonionizing radiation: Secondary | ICD-10-CM

## 2015-09-01 LAB — LIPID PANEL
CHOL/HDL RATIO: 3
Cholesterol: 201 mg/dL — ABNORMAL HIGH (ref 0–200)
HDL: 75.2 mg/dL (ref >39.00)
LDL Cholesterol: 110 mg/dL — ABNORMAL HIGH (ref 0–99)
NONHDL: 125.9
Triglycerides: 81 mg/dL (ref 0.0–149.0)
VLDL: 16.2 mg/dL (ref 0.0–40.0)

## 2015-09-01 LAB — RENAL FUNCTION PANEL
ALBUMIN: 4.3 g/dL (ref 3.5–5.2)
BUN: 21 mg/dL (ref 6–23)
CO2: 25 mEq/L (ref 19–32)
CREATININE: 0.72 mg/dL (ref 0.40–1.20)
Calcium: 9.9 mg/dL (ref 8.4–10.5)
Chloride: 103 mEq/L (ref 96–112)
GFR: 87.33 mL/min (ref >60.00)
Glucose, Bld: 105 mg/dL — ABNORMAL HIGH (ref 70–99)
Phosphorus: 3.4 mg/dL (ref 2.3–4.6)
Potassium: 4 mEq/L (ref 3.5–5.1)
Sodium: 138 mEq/L (ref 135–145)

## 2015-09-01 LAB — CBC
HEMATOCRIT: 42.2 % (ref 36.0–46.0)
HEMOGLOBIN: 13.6 g/dL (ref 12.0–15.0)
MCHC: 32.3 g/dL (ref 30.0–36.0)
MCV: 90.2 fl (ref 78.0–100.0)
PLATELETS: 275 10*3/uL (ref 150.0–400.0)
RBC: 4.67 Mil/uL (ref 3.87–5.11)
RDW: 14.1 % (ref 11.5–15.5)
WBC: 10.7 10*3/uL — ABNORMAL HIGH (ref 4.0–10.5)

## 2015-09-01 LAB — TSH: TSH: 0.51 u[IU]/mL (ref 0.35–4.50)

## 2015-09-01 LAB — VITAMIN D 25 HYDROXY (VIT D DEFICIENCY, FRACTURES): VITD: 18.58 ng/mL — ABNORMAL LOW (ref 30.00–100.00)

## 2015-09-01 MED ORDER — LOSARTAN POTASSIUM 50 MG PO TABS: ORAL_TABLET | ORAL | Status: DC

## 2015-09-01 MED ORDER — SYNTHROID 75 MCG PO TABS: ORAL_TABLET | ORAL | Status: DC

## 2015-09-01 MED ORDER — LEVOTHYROXINE SODIUM 50 MCG PO TABS: ORAL_TABLET | ORAL | Status: DC

## 2015-09-01 NOTE — Progress Notes (Signed)
Pre visit review using our clinic review tool, if applicable. No additional management support is needed unless otherwise documented below in the visit note. 

## 2015-09-01 NOTE — Patient Instructions (Signed)
Preventive Care for Adults, Female A healthy lifestyle and preventive care can promote health and wellness. Preventive health guidelines for women include the following key practices.  A routine yearly physical is a good way to check with your health care provider about your health and preventive screening. It is a chance to share any concerns and updates on your health and to receive a thorough exam.  Visit your dentist for a routine exam and preventive care every 6 months. Brush your teeth twice a day and floss once a day. Good oral hygiene prevents tooth decay and gum disease.  The frequency of eye exams is based on your age, health, family medical history, use of contact lenses, and other factors. Follow your health care provider's recommendations for frequency of eye exams.  Eat a healthy diet. Foods like vegetables, fruits, whole grains, low-fat dairy products, and lean protein foods contain the nutrients you need without too many calories. Decrease your intake of foods high in solid fats, added sugars, and salt. Eat the right amount of calories for you.Get information about a proper diet from your health care provider, if necessary.  Regular physical exercise is one of the most important things you can do for your health. Most adults should get at least 150 minutes of moderate-intensity exercise (any activity that increases your heart rate and causes you to sweat) each week. In addition, most adults need muscle-strengthening exercises on 2 or more days a week.  Maintain a healthy weight. The body mass index (BMI) is a screening tool to identify possible weight problems. It provides an estimate of body fat based on height and weight. Your health care provider can find your BMI and can help you achieve or maintain a healthy weight.For adults 20 years and older:  A BMI below 18.5 is considered underweight.  A BMI of 18.5 to 24.9 is normal.  A BMI of 25 to 29.9 is considered overweight.  A  BMI of 30 and above is considered obese.  Maintain normal blood lipids and cholesterol levels by exercising and minimizing your intake of saturated fat. Eat a balanced diet with plenty of fruit and vegetables. Blood tests for lipids and cholesterol should begin at age 45 and be repeated every 5 years. If your lipid or cholesterol levels are high, you are over 50, or you are at high risk for heart disease, you may need your cholesterol levels checked more frequently.Ongoing high lipid and cholesterol levels should be treated with medicines if diet and exercise are not working.  If you smoke, find out from your health care provider how to quit. If you do not use tobacco, do not start.  Lung cancer screening is recommended for adults aged 45-80 years who are at high risk for developing lung cancer because of a history of smoking. A yearly low-dose CT scan of the lungs is recommended for people who have at least a 30-pack-year history of smoking and are a current smoker or have quit within the past 15 years. A pack year of smoking is smoking an average of 1 pack of cigarettes a day for 1 year (for example: 1 pack a day for 30 years or 2 packs a day for 15 years). Yearly screening should continue until the smoker has stopped smoking for at least 15 years. Yearly screening should be stopped for people who develop a health problem that would prevent them from having lung cancer treatment.  If you are pregnant, do not drink alcohol. If you are  breastfeeding, be very cautious about drinking alcohol. If you are not pregnant and choose to drink alcohol, do not have more than 1 drink per day. One drink is considered to be 12 ounces (355 mL) of beer, 5 ounces (148 mL) of wine, or 1.5 ounces (44 mL) of liquor.  Avoid use of street drugs. Do not share needles with anyone. Ask for help if you need support or instructions about stopping the use of drugs.  High blood pressure causes heart disease and increases the risk  of stroke. Your blood pressure should be checked at least every 1 to 2 years. Ongoing high blood pressure should be treated with medicines if weight loss and exercise do not work.  If you are 55-79 years old, ask your health care provider if you should take aspirin to prevent strokes.  Diabetes screening is done by taking a blood sample to check your blood glucose level after you have not eaten for a certain period of time (fasting). If you are not overweight and you do not have risk factors for diabetes, you should be screened once every 3 years starting at age 45. If you are overweight or obese and you are 40-70 years of age, you should be screened for diabetes every year as part of your cardiovascular risk assessment.  Breast cancer screening is essential preventive care for women. You should practice "breast self-awareness." This means understanding the normal appearance and feel of your breasts and may include breast self-examination. Any changes detected, no matter how small, should be reported to a health care provider. Women in their 20s and 30s should have a clinical breast exam (CBE) by a health care provider as part of a regular health exam every 1 to 3 years. After age 40, women should have a CBE every year. Starting at age 40, women should consider having a mammogram (breast X-ray test) every year. Women who have a family history of breast cancer should talk to their health care provider about genetic screening. Women at a high risk of breast cancer should talk to their health care providers about having an MRI and a mammogram every year.  Breast cancer gene (BRCA)-related cancer risk assessment is recommended for women who have family members with BRCA-related cancers. BRCA-related cancers include breast, ovarian, tubal, and peritoneal cancers. Having family members with these cancers may be associated with an increased risk for harmful changes (mutations) in the breast cancer genes BRCA1 and  BRCA2. Results of the assessment will determine the need for genetic counseling and BRCA1 and BRCA2 testing.  Your health care provider may recommend that you be screened regularly for cancer of the pelvic organs (ovaries, uterus, and vagina). This screening involves a pelvic examination, including checking for microscopic changes to the surface of your cervix (Pap test). You may be encouraged to have this screening done every 3 years, beginning at age 21.  For women ages 30-65, health care providers may recommend pelvic exams and Pap testing every 3 years, or they may recommend the Pap and pelvic exam, combined with testing for human papilloma virus (HPV), every 5 years. Some types of HPV increase your risk of cervical cancer. Testing for HPV may also be done on women of any age with unclear Pap test results.  Other health care providers may not recommend any screening for nonpregnant women who are considered low risk for pelvic cancer and who do not have symptoms. Ask your health care provider if a screening pelvic exam is right for   you.  If you have had past treatment for cervical cancer or a condition that could lead to cancer, you need Pap tests and screening for cancer for at least 20 years after your treatment. If Pap tests have been discontinued, your risk factors (such as having a new sexual partner) need to be reassessed to determine if screening should resume. Some women have medical problems that increase the chance of getting cervical cancer. In these cases, your health care provider may recommend more frequent screening and Pap tests.  Colorectal cancer can be detected and often prevented. Most routine colorectal cancer screening begins at the age of 50 years and continues through age 75 years. However, your health care provider may recommend screening at an earlier age if you have risk factors for colon cancer. On a yearly basis, your health care provider may provide home test kits to check  for hidden blood in the stool. Use of a small camera at the end of a tube, to directly examine the colon (sigmoidoscopy or colonoscopy), can detect the earliest forms of colorectal cancer. Talk to your health care provider about this at age 50, when routine screening begins. Direct exam of the colon should be repeated every 5-10 years through age 75 years, unless early forms of precancerous polyps or small growths are found.  People who are at an increased risk for hepatitis B should be screened for this virus. You are considered at high risk for hepatitis B if:  You were born in a country where hepatitis B occurs often. Talk with your health care provider about which countries are considered high risk.  Your parents were born in a high-risk country and you have not received a shot to protect against hepatitis B (hepatitis B vaccine).  You have HIV or AIDS.  You use needles to inject street drugs.  You live with, or have sex with, someone who has hepatitis B.  You get hemodialysis treatment.  You take certain medicines for conditions like cancer, organ transplantation, and autoimmune conditions.  Hepatitis C blood testing is recommended for all people born from 1945 through 1965 and any individual with known risks for hepatitis C.  Practice safe sex. Use condoms and avoid high-risk sexual practices to reduce the spread of sexually transmitted infections (STIs). STIs include gonorrhea, chlamydia, syphilis, trichomonas, herpes, HPV, and human immunodeficiency virus (HIV). Herpes, HIV, and HPV are viral illnesses that have no cure. They can result in disability, cancer, and death.  You should be screened for sexually transmitted illnesses (STIs) including gonorrhea and chlamydia if:  You are sexually active and are younger than 24 years.  You are older than 24 years and your health care provider tells you that you are at risk for this type of infection.  Your sexual activity has changed  since you were last screened and you are at an increased risk for chlamydia or gonorrhea. Ask your health care provider if you are at risk.  If you are at risk of being infected with HIV, it is recommended that you take a prescription medicine daily to prevent HIV infection. This is called preexposure prophylaxis (PrEP). You are considered at risk if:  You are sexually active and do not regularly use condoms or know the HIV status of your partner(s).  You take drugs by injection.  You are sexually active with a partner who has HIV.  Talk with your health care provider about whether you are at high risk of being infected with HIV. If   you choose to begin PrEP, you should first be tested for HIV. You should then be tested every 3 months for as long as you are taking PrEP.  Osteoporosis is a disease in which the bones lose minerals and strength with aging. This can result in serious bone fractures or breaks. The risk of osteoporosis can be identified using a bone density scan. Women ages 67 years and over and women at risk for fractures or osteoporosis should discuss screening with their health care providers. Ask your health care provider whether you should take a calcium supplement or vitamin D to reduce the rate of osteoporosis.  Menopause can be associated with physical symptoms and risks. Hormone replacement therapy is available to decrease symptoms and risks. You should talk to your health care provider about whether hormone replacement therapy is right for you.  Use sunscreen. Apply sunscreen liberally and repeatedly throughout the day. You should seek shade when your shadow is shorter than you. Protect yourself by wearing long sleeves, pants, a wide-brimmed hat, and sunglasses year round, whenever you are outdoors.  Once a month, do a whole body skin exam, using a mirror to look at the skin on your back. Tell your health care provider of new moles, moles that have irregular borders, moles that  are larger than a pencil eraser, or moles that have changed in shape or color.  Stay current with required vaccines (immunizations).  Influenza vaccine. All adults should be immunized every year.  Tetanus, diphtheria, and acellular pertussis (Td, Tdap) vaccine. Pregnant women should receive 1 dose of Tdap vaccine during each pregnancy. The dose should be obtained regardless of the length of time since the last dose. Immunization is preferred during the 27th-36th week of gestation. An adult who has not previously received Tdap or who does not know her vaccine status should receive 1 dose of Tdap. This initial dose should be followed by tetanus and diphtheria toxoids (Td) booster doses every 10 years. Adults with an unknown or incomplete history of completing a 3-dose immunization series with Td-containing vaccines should begin or complete a primary immunization series including a Tdap dose. Adults should receive a Td booster every 10 years.  Varicella vaccine. An adult without evidence of immunity to varicella should receive 2 doses or a second dose if she has previously received 1 dose. Pregnant females who do not have evidence of immunity should receive the first dose after pregnancy. This first dose should be obtained before leaving the health care facility. The second dose should be obtained 4-8 weeks after the first dose.  Human papillomavirus (HPV) vaccine. Females aged 13-26 years who have not received the vaccine previously should obtain the 3-dose series. The vaccine is not recommended for use in pregnant females. However, pregnancy testing is not needed before receiving a dose. If a female is found to be pregnant after receiving a dose, no treatment is needed. In that case, the remaining doses should be delayed until after the pregnancy. Immunization is recommended for any person with an immunocompromised condition through the age of 61 years if she did not get any or all doses earlier. During the  3-dose series, the second dose should be obtained 4-8 weeks after the first dose. The third dose should be obtained 24 weeks after the first dose and 16 weeks after the second dose.  Zoster vaccine. One dose is recommended for adults aged 30 years or older unless certain conditions are present.  Measles, mumps, and rubella (MMR) vaccine. Adults born  before 1957 generally are considered immune to measles and mumps. Adults born in 1957 or later should have 1 or more doses of MMR vaccine unless there is a contraindication to the vaccine or there is laboratory evidence of immunity to each of the three diseases. A routine second dose of MMR vaccine should be obtained at least 28 days after the first dose for students attending postsecondary schools, health care workers, or international travelers. People who received inactivated measles vaccine or an unknown type of measles vaccine during 1963-1967 should receive 2 doses of MMR vaccine. People who received inactivated mumps vaccine or an unknown type of mumps vaccine before 1979 and are at high risk for mumps infection should consider immunization with 2 doses of MMR vaccine. For females of childbearing age, rubella immunity should be determined. If there is no evidence of immunity, females who are not pregnant should be vaccinated. If there is no evidence of immunity, females who are pregnant should delay immunization until after pregnancy. Unvaccinated health care workers born before 1957 who lack laboratory evidence of measles, mumps, or rubella immunity or laboratory confirmation of disease should consider measles and mumps immunization with 2 doses of MMR vaccine or rubella immunization with 1 dose of MMR vaccine.  Pneumococcal 13-valent conjugate (PCV13) vaccine. When indicated, a person who is uncertain of his immunization history and has no record of immunization should receive the PCV13 vaccine. All adults 65 years of age and older should receive this  vaccine. An adult aged 19 years or older who has certain medical conditions and has not been previously immunized should receive 1 dose of PCV13 vaccine. This PCV13 should be followed with a dose of pneumococcal polysaccharide (PPSV23) vaccine. Adults who are at high risk for pneumococcal disease should obtain the PPSV23 vaccine at least 8 weeks after the dose of PCV13 vaccine. Adults older than 61 years of age who have normal immune system function should obtain the PPSV23 vaccine dose at least 1 year after the dose of PCV13 vaccine.  Pneumococcal polysaccharide (PPSV23) vaccine. When PCV13 is also indicated, PCV13 should be obtained first. All adults aged 65 years and older should be immunized. An adult younger than age 65 years who has certain medical conditions should be immunized. Any person who resides in a nursing home or long-term care facility should be immunized. An adult smoker should be immunized. People with an immunocompromised condition and certain other conditions should receive both PCV13 and PPSV23 vaccines. People with human immunodeficiency virus (HIV) infection should be immunized as soon as possible after diagnosis. Immunization during chemotherapy or radiation therapy should be avoided. Routine use of PPSV23 vaccine is not recommended for American Indians, [place] Natives, or people younger than 65 years unless there are medical conditions that require PPSV23 vaccine. When indicated, people who have unknown immunization and have no record of immunization should receive PPSV23 vaccine. One-time revaccination 5 years after the first dose of PPSV23 is recommended for people aged 19-64 years who have chronic kidney failure, nephrotic syndrome, asplenia, or immunocompromised conditions. People who received 1-2 doses of PPSV23 before age 65 years should receive another dose of PPSV23 vaccine at age 65 years or later if at least 5 years have passed since the previous dose. Doses of PPSV23 are not  needed for people immunized with PPSV23 at or after age 65 years.  Meningococcal vaccine. Adults with asplenia or persistent complement component deficiencies should receive 2 doses of quadrivalent meningococcal conjugate (MenACWY-D) vaccine. The doses should be obtained   at least 2 months apart. Microbiologists working with certain meningococcal bacteria, [place] recruits, people at risk during an outbreak, and people who travel to or live in countries with a high rate of meningitis should be immunized. A first-year college student up through age 34 years who is living in a residence hall should receive a dose if she did not receive a dose on or after her 16th birthday. Adults who have certain high-risk conditions should receive one or more doses of vaccine.  Hepatitis A vaccine. Adults who wish to be protected from this disease, have certain high-risk conditions, work with hepatitis A-infected animals, work in hepatitis A research labs, or travel to or work in countries with a high rate of hepatitis A should be immunized. Adults who were previously unvaccinated and who anticipate close contact with an international adoptee during the first 60 days after arrival in the [place] from a country with a high rate of hepatitis A should be immunized.  Hepatitis B vaccine. Adults who wish to be protected from this disease, have certain high-risk conditions, may be exposed to blood or other infectious body fluids, are household contacts or sex partners of hepatitis B positive people, are clients or workers in certain care facilities, or travel to or work in countries with a high rate of hepatitis B should be immunized.  Haemophilus influenzae type b (Hib) vaccine. A previously unvaccinated person with asplenia or sickle cell disease or having a scheduled splenectomy should receive 1 dose of Hib vaccine. Regardless of previous immunization, a recipient of a hematopoietic stem cell transplant should receive a  3-dose series 6-12 months after her successful transplant. Hib vaccine is not recommended for adults with HIV infection. Preventive Services / Frequency Ages 35 to 4 years  Blood pressure check.** / Every 3-5 years.  Lipid and cholesterol check.** / Every 5 years beginning at age 60.  Clinical breast exam.** / Every 3 years for women in their 71s and 10s.  BRCA-related cancer risk assessment.** / For women who have family members with a BRCA-related cancer (breast, ovarian, tubal, or peritoneal cancers).  Pap test.** / Every 2 years from ages 76 through 26. Every 3 years starting at age 61 through age 76 or 93 with a history of 3 consecutive normal Pap tests.  HPV screening.** / Every 3 years from ages 37 through ages 60 to 51 with a history of 3 consecutive normal Pap tests.  Hepatitis C blood test.** / For any individual with known risks for hepatitis C.  Skin self-exam. / Monthly.  Influenza vaccine. / Every year.  Tetanus, diphtheria, and acellular pertussis (Tdap, Td) vaccine.** / Consult your health care provider. Pregnant women should receive 1 dose of Tdap vaccine during each pregnancy. 1 dose of Td every 10 years.  Varicella vaccine.** / Consult your health care provider. Pregnant females who do not have evidence of immunity should receive the first dose after pregnancy.  HPV vaccine. / 3 doses over 6 months, if 93 and younger. The vaccine is not recommended for use in pregnant females. However, pregnancy testing is not needed before receiving a dose.  Measles, mumps, rubella (MMR) vaccine.** / You need at least 1 dose of MMR if you were born in 1957 or later. You may also need a 2nd dose. For females of childbearing age, rubella immunity should be determined. If there is no evidence of immunity, females who are not pregnant should be vaccinated. If there is no evidence of immunity, females who are  pregnant should delay immunization until after pregnancy.  Pneumococcal  13-valent conjugate (PCV13) vaccine.** / Consult your health care provider.  Pneumococcal polysaccharide (PPSV23) vaccine.** / 1 to 2 doses if you smoke cigarettes or if you have certain conditions.  Meningococcal vaccine.** / 1 dose if you are age 68 to 8 years and a Market researcher living in a residence hall, or have one of several medical conditions, you need to get vaccinated against meningococcal disease. You may also need additional booster doses.  Hepatitis A vaccine.** / Consult your health care provider.  Hepatitis B vaccine.** / Consult your health care provider.  Haemophilus influenzae type b (Hib) vaccine.** / Consult your health care provider. Ages 7 to 53 years  Blood pressure check.** / Every year.  Lipid and cholesterol check.** / Every 5 years beginning at age 25 years.  Lung cancer screening. / Every year if you are aged 11-80 years and have a 30-pack-year history of smoking and currently smoke or have quit within the past 15 years. Yearly screening is stopped once you have quit smoking for at least 15 years or develop a health problem that would prevent you from having lung cancer treatment.  Clinical breast exam.** / Every year after age 48 years.  BRCA-related cancer risk assessment.** / For women who have family members with a BRCA-related cancer (breast, ovarian, tubal, or peritoneal cancers).  Mammogram.** / Every year beginning at age 41 years and continuing for as long as you are in good health. Consult with your health care provider.  Pap test.** / Every 3 years starting at age 65 years through age 37 or 70 years with a history of 3 consecutive normal Pap tests.  HPV screening.** / Every 3 years from ages 72 years through ages 60 to 40 years with a history of 3 consecutive normal Pap tests.  Fecal occult blood test (FOBT) of stool. / Every year beginning at age 21 years and continuing until age 5 years. You may not need to do this test if you get  a colonoscopy every 10 years.  Flexible sigmoidoscopy or colonoscopy.** / Every 5 years for a flexible sigmoidoscopy or every 10 years for a colonoscopy beginning at age 35 years and continuing until age 48 years.  Hepatitis C blood test.** / For all people born from [birth year] through 1965 and any individual with known risks for hepatitis C.  Skin self-exam. / Monthly.  Influenza vaccine. / Every year.  Tetanus, diphtheria, and acellular pertussis (Tdap/Td) vaccine.** / Consult your health care provider. Pregnant women should receive 1 dose of Tdap vaccine during each pregnancy. 1 dose of Td every 10 years.  Varicella vaccine.** / Consult your health care provider. Pregnant females who do not have evidence of immunity should receive the first dose after pregnancy.  Zoster vaccine.** / 1 dose for adults aged 30 years or older.  Measles, mumps, rubella (MMR) vaccine.** / You need at least 1 dose of MMR if you were born in 1957 or later. You may also need a second dose. For females of childbearing age, rubella immunity should be determined. If there is no evidence of immunity, females who are not pregnant should be vaccinated. If there is no evidence of immunity, females who are pregnant should delay immunization until after pregnancy.  Pneumococcal 13-valent conjugate (PCV13) vaccine.** / Consult your health care provider.  Pneumococcal polysaccharide (PPSV23) vaccine.** / 1 to 2 doses if you smoke cigarettes or if you have certain conditions.  Meningococcal vaccine.** /  Consult your health care provider.  Hepatitis A vaccine.** / Consult your health care provider.  Hepatitis B vaccine.** / Consult your health care provider.  Haemophilus influenzae type b (Hib) vaccine.** / Consult your health care provider. Ages 81 years and over  Blood pressure check.** / Every year.  Lipid and cholesterol check.** / Every 5 years beginning at age 10 years.  Lung cancer screening. / Every year if you  are aged 74-80 years and have a 30-pack-year history of smoking and currently smoke or have quit within the past 15 years. Yearly screening is stopped once you have quit smoking for at least 15 years or develop a health problem that would prevent you from having lung cancer treatment.  Clinical breast exam.** / Every year after age 6 years.  BRCA-related cancer risk assessment.** / For women who have family members with a BRCA-related cancer (breast, ovarian, tubal, or peritoneal cancers).  Mammogram.** / Every year beginning at age 82 years and continuing for as long as you are in good health. Consult with your health care provider.  Pap test.** / Every 3 years starting at age 47 years through age 42 or 32 years with 3 consecutive normal Pap tests. Testing can be stopped between 65 and 70 years with 3 consecutive normal Pap tests and no abnormal Pap or HPV tests in the past 10 years.  HPV screening.** / Every 3 years from ages 49 years through ages 8 or 30 years with a history of 3 consecutive normal Pap tests. Testing can be stopped between 65 and 70 years with 3 consecutive normal Pap tests and no abnormal Pap or HPV tests in the past 10 years.  Fecal occult blood test (FOBT) of stool. / Every year beginning at age 75 years and continuing until age 84 years. You may not need to do this test if you get a colonoscopy every 10 years.  Flexible sigmoidoscopy or colonoscopy.** / Every 5 years for a flexible sigmoidoscopy or every 10 years for a colonoscopy beginning at age 32 years and continuing until age 74 years.  Hepatitis C blood test.** / For all people born from [birth year] through 1967 and any individual with known risks for hepatitis C.  Osteoporosis screening.** / A one-time screening for women ages 6 years and over and women at risk for fractures or osteoporosis.  Skin self-exam. / Monthly.  Influenza vaccine. / Every year.  Tetanus, diphtheria, and acellular pertussis (Tdap/Td)  vaccine.** / 1 dose of Td every 10 years.  Varicella vaccine.** / Consult your health care provider.  Zoster vaccine.** / 1 dose for adults aged 27 years or older.  Pneumococcal 13-valent conjugate (PCV13) vaccine.** / Consult your health care provider.  Pneumococcal polysaccharide (PPSV23) vaccine.** / 1 dose for all adults aged 39 years and older.  Meningococcal vaccine.** / Consult your health care provider.  Hepatitis A vaccine.** / Consult your health care provider.  Hepatitis B vaccine.** / Consult your health care provider.  Haemophilus influenzae type b (Hib) vaccine.** / Consult your health care provider. ** Family history and personal history of risk and conditions may change your health care provider's recommendations.   This information is not intended to replace advice given to you by your health care provider. Make sure you discuss any questions you have with your health care provider.   Document Released: 11/13/2001 Document Revised: 10/08/2014 Document Reviewed: 02/12/2011 Elsevier Interactive Patient Education [employer].

## 2015-09-02 ENCOUNTER — Other Ambulatory Visit: Payer: Self-pay | Admitting: Family Medicine

## 2015-09-02 DIAGNOSIS — I10 Essential (primary) hypertension: Secondary | ICD-10-CM

## 2015-09-02 DIAGNOSIS — E039 Hypothyroidism, unspecified: Secondary | ICD-10-CM

## 2015-09-02 DIAGNOSIS — E0500 Thyrotoxicosis with diffuse goiter without thyrotoxic crisis or storm: Secondary | ICD-10-CM

## 2015-09-02 DIAGNOSIS — E785 Hyperlipidemia, unspecified: Secondary | ICD-10-CM

## 2015-09-02 DIAGNOSIS — E559 Vitamin D deficiency, unspecified: Secondary | ICD-10-CM

## 2015-09-02 LAB — HEPATITIS C ANTIBODY: HCV Ab: NEGATIVE

## 2015-09-02 MED ORDER — LEVOTHYROXINE SODIUM 50 MCG PO TABS: ORAL_TABLET | ORAL | Status: DC

## 2015-09-02 MED ORDER — VITAMIN D (ERGOCALCIFEROL) 1.25 MG (50000 UNIT) PO CAPS: 50000.0000 [IU] | ORAL_CAPSULE | ORAL | Status: DC

## 2015-09-04 ENCOUNTER — Encounter: Payer: Self-pay | Admitting: Family Medicine

## 2015-09-04 DIAGNOSIS — L578 Other skin changes due to chronic exposure to nonionizing radiation: Secondary | ICD-10-CM | POA: Insufficient documentation

## 2015-09-04 DIAGNOSIS — Z0000 Encounter for general adult medical examination without abnormal findings: Secondary | ICD-10-CM

## 2015-09-04 HISTORY — DX: Encounter for general adult medical examination without abnormal findings: Z00.00

## 2015-09-04 NOTE — Assessment & Plan Note (Signed)
Referred to dermatology for further evaluaiton

## 2015-09-04 NOTE — Assessment & Plan Note (Signed)
Tolerating statin, encouraged heart healthy diet, avoid trans fats, minimize simple carbs and saturated fats. Increase exercise as tolerated 

## 2015-09-04 NOTE — Assessment & Plan Note (Signed)
Labs reveal deficiency. Start on Vitamin D 50000 IU caps, 1 cap po weekly x 12 weeks. Disp #4 with 4 rf. Also take daily Vitamin D over the counter. If already taking a daily supplement increase by 1000 IU daily and if not start Vitamin D 2000 IU daily.  

## 2015-09-04 NOTE — Assessment & Plan Note (Signed)
On Levothyroxine, continue to monitor 

## 2015-09-04 NOTE — Progress Notes (Signed)
Subjective:    Patient ID: [place], female    DOB: 30-Oct-1955, 59 y.o.   MRN: [id number]  Chief Complaint  Patient presents with  . Annual Exam    HPI Patient is in today for annual exam and follow-up on numerous concerns. Is feeling well today but has been struggling with right hip pain. Was seen by sports medicine and with an injection pain is resolved. No recent illness. Blood pressure has been running slightly high but is better today. No headaches are associated symptoms. No other acute concerns. Denies CP/palp/SOB/HA/congestion/fevers/GI or GU c/o. Taking meds as prescribed  Past Medical History  Diagnosis Date  . Carotid disease, bilateral ([place])     [place] 10/15: R 40-59%, L 50-69%, stable for years  . Elevated sed rate     remote history of myelosuppressive disorder  . Hyperlipidemia   . Hypertension   . Grave's disease     s/p I-131 ablation 1990s  . Arthritis   . HYPOTHYROIDISM   . Vitamin D deficiency   . Personal history of colonic adenoma 11/30/2002  . Bursitis of hip 08/02/15  . Preventative health care 09/04/2015    Past Surgical History  Procedure Laterality Date  . Jaw surgery  2001  . Oophorectomy Bilateral   . Vaginal hysterectomy  2009    vaginal hysterectomy    Family History  Problem Relation Age of Onset  . Hypertension Mother   . Arthritis Mother   . Hyperlipidemia Mother   . Stroke Mother     1st age 59, then recurrent 40  . Atrial fibrillation Mother   . Epilepsy Mother   . Diabetes Neg Hx   . Heart attack Neg Hx   . Arthritis Father   . Breast cancer Other   . Breast cancer Sister   . Cancer Sister     Recurrent Breast CA  . Atrial fibrillation Brother   . Drug abuse Maternal Aunt     Social History   Social History  . Marital Status: Married    Spouse Name: N/A  . Number of Children: N/A  . Years of Education: N/A   Occupational History  .      [employer]   Social History Main Topics  . Smoking status:  Former Smoker -- 0.50 packs/day for 12 years    Quit date: 02/06/1993  . Smokeless tobacco: Never Used  . Alcohol Use: No  . Drug Use: No  . Sexual Activity: Yes   Other Topics Concern  . Not on file   Social History Narrative   pharmacy tech III    Outpatient Prescriptions Prior to Visit  Medication Sig Dispense Refill  . aspirin 81 MG tablet Take 81 mg by mouth daily.      . AVENOVA 0.01 % SOLN   0  . cycloSPORINE (RESTASIS) 0.05 % ophthalmic emulsion Place 1 drop into both eyes as needed.  0.4 mL   . ezetimibe (ZETIA) 10 MG tablet Take 1 tablet (10 mg total) by mouth daily. 90 tablet 3  . meloxicam (MOBIC) 15 MG tablet One tab PO qAM with breakfast for 2 weeks, then daily prn pain. 30 tablet 3  . losartan (COZAAR) 50 MG tablet TAKE 1 TABLET (50 MG TOTAL) BY MOUTH DAILY. 90 tablet 0  . SYNTHROID 50 MCG tablet TAKE 1 TABLET BY MOUTH ON SATURDAYS ONLY 30 tablet 3  . SYNTHROID 75 MCG tablet TAKE 1 TABLET EVERY DAY EXCEPT SATURDAYS 90 tablet 0  No facility-administered medications prior to visit.    Allergies  Allergen Reactions  . Penicillins     REACTION: swelling/dyspnea    Review of Systems  Constitutional: Negative for fever, chills and malaise/fatigue.  HENT: Negative for congestion and hearing loss.   Eyes: Negative for discharge.  Respiratory: Negative for cough, sputum production and shortness of breath.   Cardiovascular: Negative for chest pain, palpitations and leg swelling.  Gastrointestinal: Negative for heartburn, nausea, vomiting, abdominal pain, diarrhea, constipation and blood in stool.  Genitourinary: Negative for dysuria, urgency, frequency and hematuria.  Musculoskeletal: Negative for myalgias, back pain and falls.  Skin: Negative for rash.  Neurological: Negative for dizziness, sensory change, loss of consciousness, weakness and headaches.  Endo/Heme/Allergies: Negative for environmental allergies. Does not bruise/bleed easily.    Psychiatric/Behavioral: Negative for depression and suicidal ideas. The patient is not nervous/anxious and does not have insomnia.        Objective:    Physical Exam  Constitutional: She is oriented to person, place, and time. She appears well-developed and well-nourished. No distress.  HENT:  Head: Normocephalic and atraumatic.  Eyes: Conjunctivae are normal.  Neck: Neck supple. No thyromegaly present.  Cardiovascular: Normal rate, regular rhythm and normal heart sounds.   No murmur heard. Pulmonary/Chest: Effort normal and breath sounds normal. No respiratory distress.  Abdominal: Soft. Bowel sounds are normal. She exhibits no distension and no mass. There is no tenderness.  Musculoskeletal: She exhibits no edema.  Lymphadenopathy:    She has no cervical adenopathy.  Neurological: She is alert and oriented to person, place, and time.  Skin: Skin is warm and dry.  Psychiatric: She has a normal mood and affect. Her behavior is normal.    BP 122/76 mmHg  Pulse 69  Temp(Src) 98.1 F (36.7 C) (Oral)  Ht 5\' 5"  (1.651 m)  Wt 159 lb 6 oz (72.292 kg)  BMI 26.52 kg/m2  SpO2 97% Wt Readings from Last 3 Encounters:  09/01/15 159 lb 6 oz (72.292 kg)  08/30/15 160 lb (72.576 kg)  08/02/15 164 lb (74.39 kg)     Lab Results  Component Value Date   WBC 10.7* 09/01/2015   HGB 13.6 09/01/2015   HCT 42.2 09/01/2015   PLT 275.0 09/01/2015   GLUCOSE 105* 09/01/2015   CHOL 201* 09/01/2015   TRIG 81.0 09/01/2015   HDL 75.20 09/01/2015   LDLCALC 110* 09/01/2015   ALT 13 08/30/2015   AST 16 08/30/2015   NA 138 09/01/2015   K 4.0 09/01/2015   CL 103 09/01/2015   CREATININE 0.72 09/01/2015   BUN 21 09/01/2015   CO2 25 09/01/2015   TSH 0.51 09/01/2015    Lab Results  Component Value Date   TSH 0.51 09/01/2015   Lab Results  Component Value Date   WBC 10.7* 09/01/2015   HGB 13.6 09/01/2015   HCT 42.2 09/01/2015   MCV 90.2 09/01/2015   PLT 275.0 09/01/2015   Lab Results   Component Value Date   NA 138 09/01/2015   K 4.0 09/01/2015   CO2 25 09/01/2015   GLUCOSE 105* 09/01/2015   BUN 21 09/01/2015   CREATININE 0.72 09/01/2015   BILITOT 0.5 08/30/2015   ALKPHOS 67 08/30/2015   AST 16 08/30/2015   ALT 13 08/30/2015   PROT 6.6 08/30/2015   ALBUMIN 4.3 09/01/2015   CALCIUM 9.9 09/01/2015   GFR 87.33 09/01/2015   Lab Results  Component Value Date   CHOL 201* 09/01/2015   Lab Results  Component Value Date   HDL 75.20 09/01/2015   Lab Results  Component Value Date   LDLCALC 110* 09/01/2015   Lab Results  Component Value Date   TRIG 81.0 09/01/2015   Lab Results  Component Value Date   CHOLHDL 3 09/01/2015   No results found for: HGBA1C     Assessment & Plan:   Problem List Items Addressed This Visit    Graves' disease   Relevant Medications   SYNTHROID 75 MCG tablet   losartan (COZAAR) 50 MG tablet   Other Relevant Orders   TSH (Completed)   CBC (Completed)   Lipid panel (Completed)   Renal function panel (Completed)   Vitamin D (25 hydroxy) (Completed)   Hyperlipidemia    Tolerating statin, encouraged heart healthy diet, avoid trans fats, minimize simple carbs and saturated fats. Increase exercise as tolerated      Relevant Medications   SYNTHROID 75 MCG tablet   losartan (COZAAR) 50 MG tablet   Other Relevant Orders   TSH (Completed)   CBC (Completed)   Lipid panel (Completed)   Renal function panel (Completed)   Vitamin D (25 hydroxy) (Completed)   Hypertension    Well controlled, no changes to meds. Encouraged heart healthy diet such as the DASH diet and exercise as tolerated.       Relevant Medications   SYNTHROID 75 MCG tablet   losartan (COZAAR) 50 MG tablet   Other Relevant Orders   TSH (Completed)   CBC (Completed)   Lipid panel (Completed)   Renal function panel (Completed)   Vitamin D (25 hydroxy) (Completed)   Hypothyroidism    On Levothyroxine, continue to monitor      Relevant Medications    SYNTHROID 75 MCG tablet   losartan (COZAAR) 50 MG tablet   Other Relevant Orders   TSH (Completed)   CBC (Completed)   Lipid panel (Completed)   Renal function panel (Completed)   Vitamin D (25 hydroxy) (Completed)   Preventative health care - Primary    Patient encouraged to maintain heart healthy diet, regular exercise, adequate sleep. Consider daily probiotics. Take medications as prescribed. Labs reviewed. Allergies verified: UTD  Immunization Status: Flu vaccine-- 06/02/15; patient reported Tdap-- 10/02/11 PNA-- NA Shingles-- 08/23/14  A/P:  Changes to [place], [place] or Personal Hx: UTD Pap-- 02/06/13 w/ Dr. Uvaldo Rising at [facility]; normal MMG-- 05/12/14 w/ [facility]; bi-rads category 1-negative Bone Density-- 03/17/13 w/ [facility]; low bone mass (T-score -1 to -2.5); FRAX (no increased fracture risk to recommend treatment) CCS-- 07/09/08 w/ Dr. Silvano Rusk at [place]; small internal/external hemorrhoids; otherwise normal colon; follow-up in 5 years.  Care Teams Updated: Dr. Aundria Mems - Family Medicine  Dr. Camillo Flaming - Optometry  ED/Hospital/Urgent Care Visits: Last seen at the urgent care 08/30/15 for bursitis of the right hip.      Sun-damaged skin    Referred to dermatology for further evaluaiton      Relevant Orders   Ambulatory referral to Dermatology   Trochanteric bursitis of right hip    Good response from treatment at sports med is feeling well      Vitamin D deficiency    Labs reveal deficiency. Start on Vitamin D 50000 IU caps, 1 cap po weekly x 12 weeks. Disp #4 with 4 rf. Also take daily Vitamin D over the counter. If already taking a daily supplement increase by 1000 IU daily and if not start Vitamin D 2000 IU  daily.       Relevant Medications   SYNTHROID 75 MCG tablet   losartan (COZAAR) 50 MG tablet   Other Relevant Orders   TSH (Completed)   CBC (Completed)    Lipid panel (Completed)   Renal function panel (Completed)   Vitamin D (25 hydroxy) (Completed)    Other Visit Diagnoses    Breast cancer screening        Relevant Orders    MM Digital Screening    Screening for viral disease        Relevant Orders    Hepatitis C Antibody (Completed)       I am having Ms. Gillum maintain her aspirin, cycloSPORINE, AVENOVA, ezetimibe, meloxicam, SYNTHROID, and losartan.  Meds ordered this encounter  Medications  . SYNTHROID 75 MCG tablet    Sig: TAKE 1 TABLET EVERY DAY EXCEPT SATURDAYS    Dispense:  90 tablet    Refill:  1  . DISCONTD: levothyroxine (SYNTHROID) 50 MCG tablet    Sig: TAKE 1 TABLET BY MOUTH ON SATURDAYS ONLY    Dispense:  90 tablet    Refill:  1  . losartan (COZAAR) 50 MG tablet    Sig: TAKE 1 TABLET (50 MG TOTAL) BY MOUTH DAILY.    Dispense:  90 tablet    Refill:  1     Penni Homans, MD

## 2015-09-04 NOTE — Assessment & Plan Note (Signed)
Well controlled, no changes to meds. Encouraged heart healthy diet such as the DASH diet and exercise as tolerated.  °

## 2015-09-04 NOTE — Assessment & Plan Note (Signed)
Good response from treatment at sports med is feeling well

## 2015-09-04 NOTE — Assessment & Plan Note (Signed)
Patient encouraged to maintain heart healthy diet, regular exercise, adequate sleep. Consider daily probiotics. Take medications as prescribed. Labs reviewed. Allergies verified: UTD  Immunization Status: Flu vaccine-- 06/02/15; patient reported Tdap-- 10/02/11 PNA-- NA Shingles-- 08/23/14  A/P:  Changes to [place], [place] or Personal Hx: UTD Pap-- 02/06/13 w/ Dr. Uvaldo Rising at [facility]; normal MMG-- 05/12/14 w/ [facility]; bi-rads category 1-negative Bone Density-- 03/17/13 w/ [facility]; low bone mass (T-score -1 to -2.5); FRAX (no increased fracture risk to recommend treatment) CCS-- 07/09/08 w/ Dr. Silvano Rusk at [place]; small internal/external hemorrhoids; otherwise normal colon; follow-up in 5 years.  Care Teams Updated: Dr. Aundria Mems - Family Medicine  Dr. Camillo Flaming - Optometry  ED/Hospital/Urgent Care Visits: Last seen at the urgent care 08/30/15 for bursitis of the right hip.

## 2015-09-27 ENCOUNTER — Ambulatory Visit (INDEPENDENT_AMBULATORY_CARE_PROVIDER_SITE_OTHER): Payer: BLUE CROSS/BLUE SHIELD | Admitting: Physician Assistant

## 2015-09-27 ENCOUNTER — Ambulatory Visit: Payer: BLUE CROSS/BLUE SHIELD | Admitting: Sports Medicine

## 2015-09-27 ENCOUNTER — Encounter: Payer: Self-pay | Admitting: Physician Assistant

## 2015-09-27 VITALS — BP 136/70 | HR 80 | Ht 65.0 in | Wt 161.4 lb

## 2015-09-27 DIAGNOSIS — I779 Disorder of arteries and arterioles, unspecified: Secondary | ICD-10-CM | POA: Diagnosis not present

## 2015-09-27 DIAGNOSIS — I739 Peripheral vascular disease, unspecified: Principal | ICD-10-CM

## 2015-09-27 DIAGNOSIS — R0789 Other chest pain: Secondary | ICD-10-CM | POA: Diagnosis not present

## 2015-09-27 MED ORDER — EZETIMIBE 10 MG PO TABS: 10.0000 mg | ORAL_TABLET | ORAL | Status: DC

## 2015-09-27 NOTE — Progress Notes (Signed)
Cardiology Office Note   Date:  09/27/2015   ID:  [place], DOB [birth year], MRN [id number]  PCP:  Penni Homans, MD  Cardiologist:   Dr. Alcide Evener, PA-C   Chief Complaint  Patient presents with  . Chest Pain    History of Present Illness: Lindsay [place] is a 59 y.o. female with a history of  Moderate but nonobstructive cerebrovascular disease. She also has chest pain and a stress Myoview  Had normal images, but her ECG was significantly abnormal. She should never have a treadmill only stress test.  Lindsay [place] presents for  Evaluation of chest pain.    She has not been feeling well for a couple of weeks. However, there were no specific symptoms. She had some ankle swelling at times but nothing else.    yesterday, she was in her usual state of health when she had onset of  Left sided chest pain.  She also noticed some ankle swelling. She was a little nauseated but had no shortness of breath, vomiting, or diaphoresis. It was a 5 or 6/10. It decreased by bedtime but it was still there. The only medication she took for it was aspirin. She also had some jaw tightness but she has this intermittently because of a history of jaw surgery. An additional symptom was an occipital headache that lasted for about 10 minutes. She did not take anything for the headache. She has had some chest pain at a 1/10 today but is not currently complaining of anything.   She has recently had bursitis in her hip and got a steroid injection which helped. Because of the bursitis, she was off her usual exercise program for several weeks. She is doing less walking now but is still walking 3 miles a day and sometimes a mile at lunch time. She never gets chest pain with this and has no new dyspnea on exertion.   Past Medical History  Diagnosis Date  . Carotid disease, bilateral ([place])     [place] 10/15: R 40-59%, L 50-69%, stable for years  . Elevated sed rate     remote history of  myelosuppressive disorder  . Hyperlipidemia   . Hypertension   . Grave's disease     s/p I-131 ablation 1990s  . Arthritis   . HYPOTHYROIDISM   . Vitamin D deficiency   . Personal history of colonic adenoma 11/30/2002  . Bursitis of hip 08/02/15  . Preventative health care 09/04/2015    Past Surgical History  Procedure Laterality Date  . Jaw surgery  2001  . Oophorectomy Bilateral   . Vaginal hysterectomy  2009    vaginal hysterectomy    Current Outpatient Prescriptions  Medication Sig Dispense Refill  . aspirin 81 MG tablet Take 81 mg by mouth daily.      . AVENOVA 0.01 % SOLN Place 2 drops into both eyes daily as needed (dry eye).   0  . ezetimibe (ZETIA) 10 MG tablet Take 1 tablet (10 mg total) by mouth daily. 90 tablet 3  . levothyroxine (SYNTHROID) 50 MCG tablet TAKE 1 TABLET BY MOUTH ON SATURDAYS ONLY 90 tablet 1  . losartan (COZAAR) 50 MG tablet TAKE 1 TABLET (50 MG TOTAL) BY MOUTH DAILY. 90 tablet 1  . meloxicam (MOBIC) 15 MG tablet One tab PO qAM with breakfast for 2 weeks, then daily prn pain. 30 tablet 3  . SYNTHROID 75 MCG tablet TAKE 1 TABLET EVERY DAY EXCEPT SATURDAYS 90 tablet 1  .  Vitamin D, Ergocalciferol, (DRISDOL) 50000 UNITS CAPS capsule Take 1 capsule (50,000 Units total) by mouth every 7 (seven) days. 4 capsule 4   No current facility-administered medications for this visit.    Allergies:   Penicillins    Social History:  The patient  reports that she quit smoking about 22 years ago. She has never used smokeless tobacco. She reports that she does not drink alcohol or use illicit drugs.   Family History:  The patient's family history includes Arthritis in her father and mother; Atrial fibrillation in her brother and mother; Breast cancer in her other and sister; Cancer in her sister; Drug abuse in her maternal aunt; Epilepsy in her mother; Hyperlipidemia in her mother; Hypertension in her mother; Stroke in her mother. There is no history of Diabetes or Heart  attack.    ROS:  Please see the history of present illness. All other systems are reviewed and negative.    PHYSICAL EXAM: VS:  BP 136/70 mmHg  Pulse 80  Ht 5\' 5"  (1.651 m)  Wt 161 lb 6.4 oz (73.211 kg)  BMI 26.86 kg/m2 , BMI Body mass index is 26.86 kg/(m^2). GEN: Well nourished, well developed, female in no acute distress HEENT: normal for age  Neck: no JVD,  Soft anterior bilateral carotid bruits, no masses Cardiac: RRR; no murmur, no rubs, or gallops Respiratory:  clear to auscultation bilaterally, normal work of breathing GI: soft, nontender, nondistended, + BS MS: no deformity or atrophy; no edema; distal pulses are 2+ in all 4 extremities  Skin: warm and dry, no rash Neuro:  Strength and sensation are intact Psych: euthymic mood, full affect   EKG:  EKG is ordered today. The ekg ordered today demonstrates  Sinus rhythm, rate 80, no abnormal intervals and no acute ischemic changes   Recent Labs: 08/30/2015: ALT 13 09/01/2015: BUN 21; Creatinine, Ser 0.72; Hemoglobin 13.6; Platelets 275.0; Potassium 4.0; Sodium 138; TSH 0.51    Lipid Panel    Component Value Date/Time   CHOL 201* 09/01/2015 1040   TRIG 81.0 09/01/2015 1040   HDL 75.20 09/01/2015 1040   CHOLHDL 3 09/01/2015 1040   VLDL 16.2 09/01/2015 1040   LDLCALC 110* 09/01/2015 1040     Wt Readings from Last 3 Encounters:  09/27/15 161 lb 6.4 oz (73.211 kg)  09/01/15 159 lb 6 oz (72.292 kg)  08/30/15 160 lb (72.576 kg)     Other studies Reviewed: Additional studies/ records that were reviewed today include:  Office records and imaging studies.  ASSESSMENT AND PLAN:  1.   Chest pain: she has risk factors for heart disease and a strong one is her history of cerebrovascular disease. Because she is generally able to exercise without any ischemic symptoms, I did not feel that admission was indicated.   I recommended a gated exercise treadmill Myoview and the patient is agreeable to this. Because of her  history of an abnormal ECG during stress, Myoview images are necessary. We will obtain this in a timely manner. She is to continue her baby aspirin daily. Her blood pressure is well-controlled so we will not add a beta blocker at this time.   2. Hyperlipidemia, intolerant to multiple statins: she tolerated this very well for a while but has recently started getting leg pain. She notes that this is the same symptoms she had with other statins.   Requested that she hold the Zetia for weekend and started back on Monday Wednesday and Friday. Follow-up with Dr. Stanford Breed in  3 months and  Get LFTs with a lipid profile at that time.   Current medicines are reviewed at length with the patient today.  The patient has concerns regarding medicines.  The following changes have been made:   Hold Zetia for a week and then start back on Monday Wednesday and Friday  Labs/ tests ordered today include:   Orders Placed This Encounter  Procedures  . Myocardial Perfusion Imaging  . EKG 12-Lead     Disposition:   FU with  Dr. Stanford Breed  Signed, Lenoard Aden  09/27/2015 5:39 PM    [address], [place], [place]  [address] Phone: [phone]; Fax: [phone]     essential hypertension

## 2015-09-27 NOTE — Patient Instructions (Addendum)
Medication Instructions:  Your physician has recommended you make the following change in your medication:  1- HOLD Zetia for one week and then take one tablet by mouth on Monday, Wednesday, and Friday.  Labwork: NONE  Testing/Procedures: Your physician has requested that you have en exercise stress myoview. For further information please visit [email or web address]. Please follow instruction sheet, as given.  Follow-Up: Your physician recommends that you schedule a follow-up appointment with Dr. Stanford Breed as directed at last visit.  Please call 911 or go to ER for chest pain. Call our office if you want to have a stress test done.    If you need a refill on your cardiac medications before your next appointment, please call your pharmacy.

## 2015-09-29 ENCOUNTER — Encounter: Payer: Self-pay | Admitting: Sports Medicine

## 2015-09-29 ENCOUNTER — Ambulatory Visit (INDEPENDENT_AMBULATORY_CARE_PROVIDER_SITE_OTHER): Payer: BLUE CROSS/BLUE SHIELD | Admitting: Sports Medicine

## 2015-09-29 ENCOUNTER — Telehealth (HOSPITAL_COMMUNITY): Payer: Self-pay

## 2015-09-29 VITALS — BP 128/70 | HR 84 | Temp 98.1°F | Resp 18

## 2015-09-29 DIAGNOSIS — M25561 Pain in right knee: Secondary | ICD-10-CM

## 2015-09-29 NOTE — Progress Notes (Signed)
  Subjective:    CC: Follow-up  HPI: Right knee pain: Suspected osteoarthritis with medial meniscal injury, injected at the last visit and completely pain-free today.  Past medical history, Surgical history, Family history not pertinant except as noted below, Social history, Allergies, and medications have been entered into the medical record, reviewed, and no changes needed.   Review of Systems: No fevers, chills, night sweats, weight loss, chest pain, or shortness of breath.   Objective:    General: Well Developed, well nourished, and in no acute distress.  Neuro: Alert and oriented x3, extra-ocular muscles intact, sensation grossly intact.  HEENT: Normocephalic, atraumatic, pupils equal round reactive to light, neck supple, no masses, no lymphadenopathy, thyroid nonpalpable.  Skin: Warm and dry, no rashes. Cardiac: Regular rate and rhythm, no murmurs rubs or gallops, no lower extremity edema.  Respiratory: Clear to auscultation bilaterally. Not using accessory muscles, speaking in full sentences. Right Knee: Normal to inspection with no erythema or effusion or obvious bony abnormalities. Palpation normal with no warmth or joint line tenderness or patellar tenderness or condyle tenderness. Minimal prepatellar bursitis that is nontender. ROM normal in flexion and extension and lower leg rotation. Ligaments with solid consistent endpoints including ACL, PCL, LCL, MCL. Negative Mcmurray's and provocative meniscal tests. Non painful patellar compression. Patellar and quadriceps tendons unremarkable. Hamstring and quadriceps strength is normal.  Impression and Recommendations:

## 2015-09-29 NOTE — Assessment & Plan Note (Signed)
Completely resolved after injection, return as needed.

## 2015-09-29 NOTE — Telephone Encounter (Signed)
Encounter complete. 

## 2015-10-04 ENCOUNTER — Ambulatory Visit (HOSPITAL_COMMUNITY)
Admission: RE | Admit: 2015-10-04 | Discharge: 2015-10-04 | Disposition: A | Discharge status: Home / Self Care | Payer: BLUE CROSS/BLUE SHIELD | Source: Ambulatory Visit | Attending: Cardiovascular Disease | Admitting: Cardiovascular Disease

## 2015-10-04 ENCOUNTER — Encounter (HOSPITAL_COMMUNITY): Payer: Self-pay | Admitting: *Deleted

## 2015-10-04 DIAGNOSIS — R110 Nausea: Secondary | ICD-10-CM | POA: Diagnosis not present

## 2015-10-04 DIAGNOSIS — R0789 Other chest pain: Secondary | ICD-10-CM | POA: Diagnosis not present

## 2015-10-04 DIAGNOSIS — I739 Peripheral vascular disease, unspecified: Secondary | ICD-10-CM

## 2015-10-04 DIAGNOSIS — I10 Essential (primary) hypertension: Secondary | ICD-10-CM | POA: Diagnosis not present

## 2015-10-04 DIAGNOSIS — Z87891 Personal history of nicotine dependence: Secondary | ICD-10-CM | POA: Diagnosis not present

## 2015-10-04 DIAGNOSIS — I779 Disorder of arteries and arterioles, unspecified: Secondary | ICD-10-CM | POA: Diagnosis present

## 2015-10-04 LAB — MYOCARDIAL PERFUSION IMAGING
CHL CUP NUCLEAR SRS: 0
CHL CUP NUCLEAR SSS: 1
Estimated workload: 6.6 METS
Exercise duration (min): 4 min
Exercise duration (sec): 41 s
LV dias vol: 80 mL
LV sys vol: 34 mL
MPHR: 159 {beats}/min
Peak HR: 160 {beats}/min
Percent HR: 100 %
RPE: 16
Rest HR: 66 {beats}/min
TID: 1.18

## 2015-10-04 MED ORDER — TECHNETIUM TC 99M SESTAMIBI GENERIC - CARDIOLITE
31.1000 | Freq: Once | INTRAVENOUS | Status: AC | PRN
  Administered 2015-10-04: 31.1 via INTRAVENOUS

## 2015-10-04 MED ORDER — TECHNETIUM TC 99M SESTAMIBI GENERIC - CARDIOLITE
10.7000 | Freq: Once | INTRAVENOUS | Status: AC | PRN
  Administered 2015-10-04: 11 via INTRAVENOUS

## 2015-10-04 NOTE — Progress Notes (Signed)
[name], PA-C evaluated Cardiolite study and EKG's. OK d/c pt home.Lindsay Hodges A

## 2015-10-19 ENCOUNTER — Telehealth: Payer: Self-pay

## 2015-10-19 NOTE — Telephone Encounter (Signed)
Patient called back and I explained to her coding stated the charges are correct. Patient stated she does not see why she was billed for an office visit because she did not bring up any additional problems in her visit and the provider asked her these questions and she and answered them. She said she did not ask for any referral for her skin it was recommended and she feels she should have been informed she will be charged for this. I told the patient I will check with provider to see if she intended to bill the office visit and once I hear from Provider I will call her with an update.

## 2015-10-19 NOTE — Telephone Encounter (Signed)
Patient came by the office upset that she was charge an office visit for her CPE. She stated she did not bring up any other issues during her exam. I told patient I would reach out to charge  Correction to see if the coding is correct. Once I hear from them I will update her.

## 2015-10-20 NOTE — Telephone Encounter (Signed)
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 If I do not address her many complicated medical problems and something is not well managed she could get in trouble. The annual exam is important for life style choices, directives, preventative health care measures etc but I also needed to order labs etc to manage her medical conditions so they stay on target and coordinate all of her medicines to help keep her heathy. If people want to keep managing their chronic conditions separate from their CPE then I would need to ask them to come in more often. I could do her CPE then manage her chronic conditions by making her come back in in 1-2 months but I do not want to make her spend more of her time. So I squeeze it in and finish the work of charting and organizing the visit after hours. Hope that helps

## 2015-10-21 NOTE — Telephone Encounter (Signed)
Understood, thanks for taking the time to explain to her. It will be up to her from here.

## 2015-10-21 NOTE — Telephone Encounter (Signed)
I talked to the patient she was still upset about the bill and stated she maybe looking for another provider she doesn't understand why she was charged for the ov even after I explained. I expressed my apologizes and told the patient I would notify the provider of her preference for future CPE to have a separate visit for her chronic conditions

## 2015-11-02 ENCOUNTER — Other Ambulatory Visit: Payer: Self-pay | Admitting: Family Medicine

## 2015-11-02 DIAGNOSIS — Z1231 Encounter for screening mammogram for malignant neoplasm of breast: Secondary | ICD-10-CM

## 2015-11-15 ENCOUNTER — Other Ambulatory Visit: Payer: Self-pay | Admitting: Family Medicine

## 2015-12-27 LAB — HM MAMMOGRAPHY

## 2016-01-03 ENCOUNTER — Encounter: Payer: Self-pay | Admitting: Family Medicine

## 2016-01-10 ENCOUNTER — Ambulatory Visit (INDEPENDENT_AMBULATORY_CARE_PROVIDER_SITE_OTHER): Payer: BLUE CROSS/BLUE SHIELD | Admitting: Family Medicine

## 2016-01-10 VITALS — BP 148/71 | HR 71 | Resp 12

## 2016-01-10 DIAGNOSIS — M7061 Trochanteric bursitis, right hip: Secondary | ICD-10-CM | POA: Diagnosis not present

## 2016-01-10 DIAGNOSIS — M722 Plantar fascial fibromatosis: Secondary | ICD-10-CM

## 2016-01-10 DIAGNOSIS — M7071 Other bursitis of hip, right hip: Secondary | ICD-10-CM | POA: Diagnosis not present

## 2016-01-10 NOTE — Patient Instructions (Addendum)
Thank you for coming in today. Return in 1-2 months.  Do the heel exercises and the ice massage.  Attend PT.   Plantar Fasciitis With Rehab The plantar fascia is a fibrous, ligament-like, soft-tissue structure that spans the bottom of the foot. Plantar fasciitis, also called heel spur syndrome, is a condition that causes pain in the foot due to inflammation of the tissue. SYMPTOMS   Pain and tenderness on the underneath side of the foot.  Pain that worsens with standing or walking. CAUSES  Plantar fasciitis is caused by irritation and injury to the plantar fascia on the underneath side of the foot. Common mechanisms of injury include:  Direct trauma to bottom of the foot.  Damage to a small nerve that runs under the foot where the main fascia attaches to the heel bone.  Stress placed on the plantar fascia due to bone spurs. RISK INCREASES WITH:   Activities that place stress on the plantar fascia (running, jumping, pivoting, or cutting).  Poor strength and flexibility.  Improperly fitted shoes.  Tight calf muscles.  Flat feet.  Failure to warm-up properly before activity.  Obesity. PREVENTION  Warm up and stretch properly before activity.  Allow for adequate recovery between workouts.  Maintain physical fitness:  Strength, flexibility, and endurance.  Cardiovascular fitness.  Maintain a health body weight.  Avoid stress on the plantar fascia.  Wear properly fitted shoes, including arch supports for individuals who have flat feet. PROGNOSIS  If treated properly, then the symptoms of plantar fasciitis usually resolve without surgery. However, occasionally surgery is necessary. RELATED COMPLICATIONS   Recurrent symptoms that may result in a chronic condition.  Problems of the lower back that are caused by compensating for the injury, such as limping.  Pain or weakness of the foot during push-off following surgery.  Chronic inflammation, scarring, and partial  or complete fascia tear, occurring more often from repeated injections. TREATMENT  Treatment initially involves the use of ice and medication to help reduce pain and inflammation. The use of strengthening and stretching exercises may help reduce pain with activity, especially stretches of the Achilles tendon. These exercises may be performed at home or with a therapist. Your caregiver may recommend that you use heel cups of arch supports to help reduce stress on the plantar fascia. Occasionally, corticosteroid injections are given to reduce inflammation. If symptoms persist for greater than 6 months despite non-surgical (conservative), then surgery may be recommended.  MEDICATION   If pain medication is necessary, then nonsteroidal anti-inflammatory medications, such as aspirin and ibuprofen, or other minor pain relievers, such as acetaminophen, are often recommended.  Do not take pain medication within 7 days before surgery.  Prescription pain relievers may be given if deemed necessary by your caregiver. Use only as directed and only as much as you need.  Corticosteroid injections may be given by your caregiver. These injections should be reserved for the most serious cases, because they may only be given a certain number of times. HEAT AND COLD  Cold treatment (icing) relieves pain and reduces inflammation. Cold treatment should be applied for 10 to 15 minutes every 2 to 3 hours for inflammation and pain and immediately after any activity that aggravates your symptoms. Use ice packs or massage the area with a piece of ice (ice massage).  Heat treatment may be used prior to performing the stretching and strengthening activities prescribed by your caregiver, physical therapist, or athletic trainer. Use a heat pack or soak the injury in warm  water. SEEK IMMEDIATE MEDICAL CARE IF:  Treatment seems to offer no benefit, or the condition worsens.  Any medications produce adverse side  effects. EXERCISES RANGE OF MOTION (ROM) AND STRETCHING EXERCISES - Plantar Fasciitis (Heel Spur Syndrome) These exercises may help you when beginning to rehabilitate your injury. Your symptoms may resolve with or without further involvement from your physician, physical therapist or athletic trainer. While completing these exercises, remember:   Restoring tissue flexibility helps normal motion to return to the joints. This allows healthier, less painful movement and activity.  An effective stretch should be held for at least 30 seconds.  A stretch should never be painful. You should only feel a gentle lengthening or release in the stretched tissue. RANGE OF MOTION - Toe Extension, Flexion  Sit with your right / left leg crossed over your opposite knee.  Grasp your toes and gently pull them back toward the top of your foot. You should feel a stretch on the bottom of your toes and/or foot.  Hold this stretch for __________ seconds.  Now, gently pull your toes toward the bottom of your foot. You should feel a stretch on the top of your toes and or foot.  Hold this stretch for __________ seconds. Repeat __________ times. Complete this stretch __________ times per day.  RANGE OF MOTION - Ankle Dorsiflexion, Active Assisted  Remove shoes and sit on a chair that is preferably not on a carpeted surface.  Place right / left foot under knee. Extend your opposite leg for support.  Keeping your heel down, slide your right / left foot back toward the chair until you feel a stretch at your ankle or calf. If you do not feel a stretch, slide your bottom forward to the edge of the chair, while still keeping your heel down.  Hold this stretch for __________ seconds. Repeat __________ times. Complete this stretch __________ times per day.  STRETCH - Gastroc, Standing  Place hands on wall.  Extend right / left leg, keeping the front knee somewhat bent.  Slightly point your toes inward on your back  foot.  Keeping your right / left heel on the floor and your knee straight, shift your weight toward the wall, not allowing your back to arch.  You should feel a gentle stretch in the right / left calf. Hold this position for __________ seconds. Repeat __________ times. Complete this stretch __________ times per day. STRETCH - Soleus, Standing  Place hands on wall.  Extend right / left leg, keeping the other knee somewhat bent.  Slightly point your toes inward on your back foot.  Keep your right / left heel on the floor, bend your back knee, and slightly shift your weight over the back leg so that you feel a gentle stretch deep in your back calf.  Hold this position for __________ seconds. Repeat __________ times. Complete this stretch __________ times per day. STRETCH - Gastrocsoleus, Standing  Note: This exercise can place a lot of stress on your foot and ankle. Please complete this exercise only if specifically instructed by your caregiver.   Place the ball of your right / left foot on a step, keeping your other foot firmly on the same step.  Hold on to the wall or a rail for balance.  Slowly lift your other foot, allowing your body weight to press your heel down over the edge of the step.  You should feel a stretch in your right / left calf.  Hold this position for  __________ seconds.  Repeat this exercise with a slight bend in your right / left knee. Repeat __________ times. Complete this stretch __________ times per day.  STRENGTHENING EXERCISES - Plantar Fasciitis (Heel Spur Syndrome)  These exercises may help you when beginning to rehabilitate your injury. They may resolve your symptoms with or without further involvement from your physician, physical therapist or athletic trainer. While completing these exercises, remember:   Muscles can gain both the endurance and the strength needed for everyday activities through controlled exercises.  Complete these exercises as  instructed by your physician, physical therapist or athletic trainer. Progress the resistance and repetitions only as guided. STRENGTH - Towel Curls  Sit in a chair positioned on a non-carpeted surface.  Place your foot on a towel, keeping your heel on the floor.  Pull the towel toward your heel by only curling your toes. Keep your heel on the floor.  If instructed by your physician, physical therapist or athletic trainer, add ____________________ at the end of the towel. Repeat __________ times. Complete this exercise __________ times per day. STRENGTH - Ankle Inversion  Secure one end of a rubber exercise band/tubing to a fixed object (table, pole). Loop the other end around your foot just before your toes.  Place your fists between your knees. This will focus your strengthening at your ankle.  Slowly, pull your big toe up and in, making sure the band/tubing is positioned to resist the entire motion.  Hold this position for __________ seconds.  Have your muscles resist the band/tubing as it slowly pulls your foot back to the starting position. Repeat __________ times. Complete this exercises __________ times per day.    This information is not intended to replace advice given to you by your health care provider. Make sure you discuss any questions you have with your health care provider.   Document Released: 09/17/2005 Document Revised: 02/01/2015 Document Reviewed: 12/30/2008 Elsevier Interactive Patient Education 2016 [employer]   Hip Bursitis Bursitis is a swelling and soreness (inflammation) of a fluid-filled sac (bursa). This sac overlies and protects the joints.  CAUSES   Injury.  Overuse of the muscles surrounding the joint.  Arthritis.  Gout.  Infection.  Cold weather.  Inadequate warm-up and conditioning prior to activities. The cause may not be known.  SYMPTOMS   Mild to severe irritation.  Tenderness and swelling over the outside of the  hip.  Pain with motion of the hip.  If the bursa becomes infected, a fever may be present. Redness, tenderness, and warmth will develop over the hip. Symptoms usually lessen in 3 to 4 weeks with treatment, but can come back. TREATMENT If conservative treatment does not work, your caregiver may advise draining the bursa and injecting cortisone into the area. This may speed up the healing process. This may also be used as an initial treatment of choice. HOME CARE INSTRUCTIONS   Apply ice to the affected area for 15-20 minutes every 3 to 4 hours while awake for the first 2 days. Put the ice in a plastic bag and place a towel between the bag of ice and your skin.  Rest the painful joint as much as possible, but continue to put the joint through a normal range of motion at least 4 times per day. When the pain lessens, begin normal, slow movements and usual activities to help prevent stiffness of the hip.  Only take over-the-counter or prescription medicines for pain, discomfort, or fever as directed by your caregiver.  Use crutches to limit weight bearing on the hip joint, if advised.  Elevate your painful hip to reduce swelling. Use pillows for propping and cushioning your legs and hips.  Gentle massage may provide comfort and decrease swelling. SEEK IMMEDIATE MEDICAL CARE IF:   Your pain increases even during treatment, or you are not improving.  You have a fever.  You have heat and inflammation over the involved bursa.  You have any other questions or concerns. MAKE SURE YOU:   Understand these instructions.  Will watch your condition.  Will get help right away if you are not doing well or get worse.   This information is not intended to replace advice given to you by your health care provider. Make sure you discuss any questions you have with your health care provider.   Document Released: 03/09/2002 Document Revised: 12/10/2011 Document Reviewed: 04/19/2015 Elsevier  Interactive Patient Education [employer].

## 2016-01-11 ENCOUNTER — Encounter: Payer: Self-pay | Admitting: Family Medicine

## 2016-01-11 NOTE — Progress Notes (Signed)
Lindsay Hodges is a 60 y.o. female who presents to [facility] today for right lateral hip pain and left plantar foot pain.  1) left hip pain: Patient notes a burning pain on the plantar calcaneus especially with the first step in the morning and with prolonged standing. Symptoms of present about a month. Patient denies any specific injury. She has not had much treatment yet. She is concerned that she may have developed plantar fasciitis.  2) right lateral hip pain present for several weeks. Pain radiates to the lateral knee occasionally. Pain occurs when she lies on that side at night and rises from a seated position and climb stairs. She denies any weakness or numbness or loss of function. She denies any specific injury. No treatment tried yet.   Past Medical History  Diagnosis Date  . Carotid disease, bilateral ([place])     [place] 10/15: R 40-59%, L 50-69%, stable for years  . Elevated sed rate     remote history of myelosuppressive disorder  . Hyperlipidemia   . Hypertension   . Grave's disease     s/p I-131 ablation 1990s  . Arthritis   . HYPOTHYROIDISM   . Vitamin D deficiency   . Personal history of colonic adenoma 11/30/2002  . Bursitis of hip 08/02/15  . Preventative health care 09/04/2015   Past Surgical History  Procedure Laterality Date  . Jaw surgery  2001  . Oophorectomy Bilateral   . Vaginal hysterectomy  2009    vaginal hysterectomy   Social History  Substance Use Topics  . Smoking status: Former Smoker -- 0.50 packs/day for 12 years    Quit date: 02/06/1993  . Smokeless tobacco: Never Used  . Alcohol Use: No   family history includes Arthritis in her father and mother; Atrial fibrillation in her brother and mother; Breast cancer in her other and sister; Cancer in her sister; Drug abuse in her maternal aunt; Epilepsy in her mother; Hyperlipidemia in her mother; Hypertension in her mother; Stroke in her mother. There is no  history of Diabetes or Heart attack.  ROS:  No headache, visual changes, nausea, vomiting, diarrhea, constipation, dizziness, abdominal pain, skin rash, fevers, chills, night sweats, weight loss, swollen lymph nodes, body aches, joint swelling, muscle aches, chest pain, shortness of breath, mood changes, visual or auditory hallucinations.    Medications: Current Outpatient Prescriptions  Medication Sig Dispense Refill  . aspirin 81 MG tablet Take 81 mg by mouth daily.      . AVENOVA 0.01 % SOLN Place 2 drops into both eyes daily as needed (dry eye).   0  . ezetimibe (ZETIA) 10 MG tablet Take 1 tablet (10 mg total) by mouth 3 (three) times a week. 90 tablet 3  . levothyroxine (SYNTHROID) 50 MCG tablet TAKE 1 TABLET BY MOUTH ON SATURDAYS ONLY 90 tablet 1  . losartan (COZAAR) 50 MG tablet TAKE 1 TABLET (50 MG TOTAL) BY MOUTH DAILY. 90 tablet 1  . meloxicam (MOBIC) 15 MG tablet One tab PO qAM with breakfast for 2 weeks, then daily prn pain. 30 tablet 3  . SYNTHROID 75 MCG tablet TAKE 1 TABLET EVERY DAY EXCEPT SATURDAYS 90 tablet 1  . SYNTHROID 75 MCG tablet TAKE 1 TABLET EVERY DAY EXCEPT SATURDAYS 90 tablet 0  . Vitamin D, Ergocalciferol, (DRISDOL) 50000 UNITS CAPS capsule Take 1 capsule (50,000 Units total) by mouth every 7 (seven) days. 4 capsule 4   No current facility-administered medications for this visit.  Allergies  Allergen Reactions  . Penicillins     REACTION: swelling/dyspnea     Exam:  BP 148/71 mmHg  Pulse 71  Resp 12 General: Well Developed, well nourished, and in no acute distress.  Neuro/Psych: Alert and oriented x3, extra-ocular muscles intact, able to move all 4 extremities, sensation grossly intact. Skin: Warm and dry, no rashes noted.  Respiratory: Not using accessory muscles, speaking in full sentences, trachea midline.  Cardiovascular: Pulses palpable, no extremity edema. Abdomen: Does not appear distended. MSK:  Back: Nontender normal motion Right hip  normal-appearing. Tender palpation overlying the greater trochanter. Normal motion. Strength is diminished to abduction right hip compared to left Left hip: Normal-appearing nontender normal motion normal strength Knee bilaterally nontender normal motion. Left foot: Normal-appearing. Tender palpation medial and lateral plantar calcaneus. Normal motion pulses capillary refill sensation. Normal gait.  No results found for this or any previous visit (from the past 24 hour(s)). No results found.   Please see individual assessment and plan sections.

## 2016-01-11 NOTE — Assessment & Plan Note (Signed)
Trochanteric bursitis of the right hip. Treat with physical therapy. Recheck in a few weeks to months.

## 2016-01-11 NOTE — Assessment & Plan Note (Signed)
Trochanteric bursitis right hip:

## 2016-01-11 NOTE — Assessment & Plan Note (Signed)
Plantar fasciitis. Treat with eccentric heel exercises and cryotherapy. Recheck in a few weeks to months not better.

## 2016-01-18 ENCOUNTER — Ambulatory Visit (INDEPENDENT_AMBULATORY_CARE_PROVIDER_SITE_OTHER): Payer: BLUE CROSS/BLUE SHIELD | Admitting: Rehabilitation

## 2016-01-18 DIAGNOSIS — R262 Difficulty in walking, not elsewhere classified: Secondary | ICD-10-CM

## 2016-01-18 DIAGNOSIS — M25551 Pain in right hip: Secondary | ICD-10-CM

## 2016-01-18 DIAGNOSIS — M25651 Stiffness of right hip, not elsewhere classified: Secondary | ICD-10-CM

## 2016-01-18 NOTE — Therapy (Signed)
[place], [place], [address] Phone: [phone]   Fax:  [phone]  Physical Therapy Evaluation  Patient Details  Name: Lindsay Hodges MRN: [id number] Date of Birth: March 03, 1956 Referring Provider: Dr. Georgina Snell  Encounter Date: 01/18/2016      PT End of Session - 01/18/16 0752    Visit Number 1   Number of Visits 12   Date for PT Re-Evaluation 02/29/16   PT Start Time 0706   PT Stop Time 0750   PT Time Calculation (min) 44 min   Activity Tolerance Patient tolerated treatment well      Past Medical History  Diagnosis Date  . Carotid disease, bilateral ([place])     [place] 10/15: R 40-59%, L 50-69%, stable for years  . Elevated sed rate     remote history of myelosuppressive disorder  . Hyperlipidemia   . Hypertension   . Grave's disease     s/p I-131 ablation 1990s  . Arthritis   . HYPOTHYROIDISM   . Vitamin D deficiency   . Personal history of colonic adenoma 11/30/2002  . Bursitis of hip 08/02/15  . Preventative health care 09/04/2015    Past Surgical History  Procedure Laterality Date  . Jaw surgery  2001  . Oophorectomy Bilateral   . Vaginal hysterectomy  2009    vaginal hysterectomy    There were no vitals filed for this visit.       Subjective Assessment - 01/18/16 0711    Subjective Pt presents with R hip pain x 6 months.  The pain is present with walking more into the glute region, and when lying on that side at night.  Sometimes it is bad and sometimes it is fine.  Had an injection in november which helped a bit.  Thinks the limp from the knee is contributing to the hip.  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     Pertinent History R knee injury x 2 years ago, L sided plantar fasciitis   Limitations Standing;Walking   How long can you walk comfortably? 1.101miles   Diagnostic tests xrays of back and hip which were normal   Patient Stated Goals  decrease the pain   Currently in Pain? Other (Comment)  up to 8/10 lateral R hip; reports rest decreases the pain and activity increases the pain            [facility] PT Assessment - 01/18/16 0001    Assessment   Medical Diagnosis R hip bursitis   Referring Provider Dr. Georgina Snell   Onset Date/Surgical Date 07/01/16   Next MD Visit end of may   Prior Therapy no   Precautions   Precautions None   Restrictions   Weight Bearing Restrictions No   Balance Screen   Has the patient fallen in the past 6 months No   Prior Function   Vocation Full time employment   Vocation Requirements standing and walking   Leisure walking   Observation/Other Assessments   Focus on Therapeutic Outcomes (FOTO)  41%   Functional Tests   Functional tests Squat;Single leg stance;Sit to Stand   Squat   Comments forward weight shift   Single Leg Stance   Comments WNL bil with pain R SLS in glute   Sit to Stand   Comments WNL   ROM / Strength   AROM / PROM / Strength AROM;PROM;Strength   AROM   Overall AROM  Within functional limits for tasks performed   PROM   Overall PROM Comments pain in R lateral hip with KTOS and piriformis stretch positions; limited IR bilaterally at 25deg   Strength   Strength Assessment Site Hip;Knee;Ankle   Right/Left Hip Right;Left   Right Hip Flexion 4/5   Right Hip Extension 5/5   Right Hip External Rotation  4/5   Right Hip Internal Rotation 4+/5  pain   Right Hip ABduction 4-/5   Left Hip Flexion 4/5   Left Hip Extension 5/5   Left Hip External Rotation 5/5   Left Hip Internal Rotation 4+/5   Left Hip ABduction 4-/5   Right/Left Knee Right;Left   Right Knee Flexion 4+/5   Right Knee Extension 4+/5   Left Knee Flexion 4+/5   Left Knee Extension 4+/5   Right/Left Ankle Right;Left   Right Ankle Dorsiflexion 5/5   Right Ankle Plantar Flexion 5/5   Left Ankle Dorsiflexion 5/5   Left Ankle Plantar Flexion 5/5   Flexibility   Soft Tissue Assessment /Muscle Length yes    Hamstrings 80bil   Palpation   Palpation comment +2-3 ttp R greater trochanter, +1-2 proximal ITB   Ambulation/Gait   Gait Comments WNL; slight heel pain today                   [facility] Adult PT Treatment/Exercise - 01/18/16 0001    Modalities   Modalities Iontophoresis   Iontophoresis   Type of Iontophoresis Dexamethasone   Location R greater trochanter   Dose 60mA   Time 6hr wear     Education verbally on HEP today           PT Education - 01/18/16 0751    Education provided Yes   Education Details HEP; iontophoresis use and side effects, POC   Person(s) Educated Patient   Methods Explanation;Demonstration   Comprehension Verbalized understanding             PT Long Term Goals - 01/18/16 0807    PT LONG TERM GOAL #1   Title Independent with HEP   Time 6   Period Weeks   Status New   PT LONG TERM GOAL #2   Title increase walking tolerance to 3 miles without increased hip pain   Time 6   Period Weeks   Status New   PT LONG TERM GOAL #3   Title demonstrate a painfree single leg stance on the R   Time 6   Period Weeks   Status New               Plan - 01/18/16 0753    Clinical Impression Statement Pt presents with R greater trochanteric bursitis x 6 months with contribution from hip abductor weakness bilaterally, tightness in bil hamstrings, piriformis, and into hip external rotation.  Patient also with R knee issues that flare up occasionally leading to more of a limp, and current plantar fasciitis of the L foot.     Rehab Potential Excellent   PT Frequency 2x / week   PT Duration 6 weeks   PT Treatment/Interventions Electrical Stimulation;Cryotherapy;Iontophoresis 4mg /ml Dexamethasone;Moist Heat;Ultrasound;Gait training;Therapeutic activities;Therapeutic exercise;Neuromuscular re-education;Patient/family education;Manual techniques;Taping   PT Next Visit Plan ionto effects?, review HEP; start hip strengthening, [place], manual techniques    Consulted and Agree with Plan of Care Patient      Patient will benefit from skilled therapeutic intervention in order to improve the following deficits and impairments:  Decreased activity tolerance, Pain, Decreased range  of motion, Decreased strength  Visit Diagnosis: Pain in right hip - Plan: PT plan of care cert/re-cert  Difficulty in walking, not elsewhere classified - Plan: PT plan of care cert/re-cert  Hip stiffness, right - Plan: PT plan of care cert/re-cert     Problem List Patient Active Problem List   Diagnosis Date Noted  . Plantar fasciitis, left 01/10/2016  . Preventative health care 09/04/2015  . Sun-damaged skin 09/04/2015  . Low back pain radiating to both legs 03/30/2015  . UTI symptoms 03/16/2015  . Trochanteric bursitis of right hip 02/14/2015  . Right knee pain 03/26/2014  . Seasonal affective disorder ([place]) 11/25/2012  . Vitamin D deficiency 02/14/2012  . Hyperlipidemia   . Hypertension   . Graves' disease   . Arthritis   . Carotid disease, bilateral ([place])   . CAROTID BRUIT, LEFT 04/11/2009  . Hypothyroidism 01/06/2009  . Personal history of colonic adenoma 11/30/2002    Stark Bray, DPT, CMP 01/18/2016, 8:16 AM  [facility] [place], [place], [address] Phone: [phone]   Fax:  [phone]  Name: Sherian Bigness MRN: [id number] Date of Birth: 1955-11-21

## 2016-01-23 ENCOUNTER — Ambulatory Visit (INDEPENDENT_AMBULATORY_CARE_PROVIDER_SITE_OTHER): Payer: BLUE CROSS/BLUE SHIELD | Admitting: Physical Therapy

## 2016-01-23 DIAGNOSIS — M545 Low back pain, unspecified: Secondary | ICD-10-CM

## 2016-01-23 DIAGNOSIS — M25551 Pain in right hip: Secondary | ICD-10-CM

## 2016-01-23 DIAGNOSIS — M25651 Stiffness of right hip, not elsewhere classified: Secondary | ICD-10-CM

## 2016-01-23 DIAGNOSIS — R2689 Other abnormalities of gait and mobility: Secondary | ICD-10-CM | POA: Diagnosis not present

## 2016-01-23 NOTE — Therapy (Signed)
[place], [place], [address] Phone: [phone]   Fax:  [phone]  Physical Therapy Treatment  Patient Details  Name: Lindsay Hodges MRN: [id number] Date of Birth: 26-May-1956 Referring Provider: Dr. Georgina Snell   Encounter Date: 01/23/2016      PT End of Session - 01/23/16 1527    Visit Number 2   Number of Visits 12   Date for PT Re-Evaluation 02/29/16   PT Start Time [id number]  pt arrived late   PT Stop Time 1621   PT Time Calculation (min) 60 min      Past Medical History  Diagnosis Date  . Carotid disease, bilateral ([place])     [place] 10/15: R 40-59%, L 50-69%, stable for years  . Elevated sed rate     remote history of myelosuppressive disorder  . Hyperlipidemia   . Hypertension   . Grave's disease     s/p I-131 ablation 1990s  . Arthritis   . HYPOTHYROIDISM   . Vitamin D deficiency   . Personal history of colonic adenoma 11/30/2002  . Bursitis of hip 08/02/15  . Preventative health care 09/04/2015    Past Surgical History  Procedure Laterality Date  . Jaw surgery  2001  . Oophorectomy Bilateral   . Vaginal hysterectomy  2009    vaginal hysterectomy    There were no vitals filed for this visit.      Subjective Assessment - 01/23/16 1528    Subjective Pt reports she had burning pain down her Rt thigh and into calf after she had ionto patch on hip.  Pt requests to not repeat ionto treatment.     Diagnostic tests xrays of back and hip which were normal   Currently in Pain? Yes   Pain Score 5    Pain Location Hip   Pain Orientation Right   Aggravating Factors  stairs    Pain Relieving Factors finding the right position to get comfortable            [facility] PT Assessment - 01/23/16 0001    Assessment   Medical Diagnosis R hip bursitis   Referring Provider Dr. Georgina Snell    Onset Date/Surgical Date 07/02/15   Next MD Visit End of May           [facility] Adult PT Treatment/Exercise -  01/23/16 0001    Self-Care   Self-Care Other Self-Care Comments   Other Self-Care Comments  Educated pt on how to perform self massage to Rt hip / lateral thigh with ball; pt verbalized understanding.    Exercises   Exercises Knee/Hip   Knee/Hip Exercises: Stretches   Passive Hamstring Stretch Right;3 reps;30 seconds   Passive Hamstring Stretch Limitations (shown seated version)   ITB Stretch Right;3 reps;30 seconds   Piriformis Stretch Right;3 reps;20 seconds   Piriformis Stretch Limitations (shown seated version as well)   Gastroc Stretch Right;Left;2 reps;30 seconds   Other Knee/Hip Stretches Lt plantar fascia x 20 sec x 2 reps   Knee/Hip Exercises: Aerobic   Nustep L4: 5 min    Knee/Hip Exercises: Standing   Step Down Left;2 sets;10 reps  3"    Step Down Limitations (heel taps)    Knee/Hip Exercises: Sidelying   Hip ABduction 2 sets;Right   Modalities   Modalities Electrical Stimulation;Moist Heat   Moist Heat Therapy   Number Minutes Moist Heat 15 Minutes   Moist Heat Location Hip   Electrical Stimulation   Electrical Stimulation Location  Rt hip/ lateral thigh   Electrical Stimulation Action IFC   Electrical Stimulation Parameters to tolerance    Electrical Stimulation Goals Pain;Tone   Manual Therapy   Manual Therapy Soft tissue mobilization   Soft tissue mobilization TPR to Rt hip rotators and lateral hip; pt somewhat guarded and very point tender.                      PT Long Term Goals - 01/18/16 0807    PT LONG TERM GOAL #1   Title Independent with HEP   Time 6   Period Weeks   Status New   PT LONG TERM GOAL #2   Title increase walking tolerance to 3 miles without increased hip pain   Time 6   Period Weeks   Status New   PT LONG TERM GOAL #3   Title demonstrate a painfree single leg stance on the R   Time 6   Period Weeks   Status New               Plan - 01/23/16 1659    Clinical Impression Statement Pt tolerated exercises well,  with minimal increase in pain. Pt very point tender with manual therapy.  Pt reported reduction in pain at end of session with use of estim and MHP.  Progressing towards goals.    Rehab Potential Excellent   PT Frequency 2x / week   PT Duration 6 weeks   PT Treatment/Interventions Electrical Stimulation;Cryotherapy;Iontophoresis 4mg /ml Dexamethasone;Moist Heat;Ultrasound;Gait training;Therapeutic activities;Therapeutic exercise;Neuromuscular re-education;Patient/family education;Manual techniques;Taping   PT Next Visit Plan Continue progressive hip strengthening, LE stretching. Trial of [place] and manual.     PT Home Exercise Plan pt to trial self massage to Rt hip with ball.    Consulted and Agree with Plan of Care Patient      Patient will benefit from skilled therapeutic intervention in order to improve the following deficits and impairments:  Decreased activity tolerance, Pain, Decreased range of motion, Decreased strength  Visit Diagnosis: No diagnosis found.     Problem List Patient Active Problem List   Diagnosis Date Noted  . Plantar fasciitis, left 01/10/2016  . Preventative health care 09/04/2015  . Sun-damaged skin 09/04/2015  . Low back pain radiating to both legs 03/30/2015  . UTI symptoms 03/16/2015  . Trochanteric bursitis of right hip 02/14/2015  . Right knee pain 03/26/2014  . Seasonal affective disorder ([place]) 11/25/2012  . Vitamin D deficiency 02/14/2012  . Hyperlipidemia   . Hypertension   . Graves' disease   . Arthritis   . Carotid disease, bilateral ([place])   . CAROTID BRUIT, LEFT 04/11/2009  . Hypothyroidism 01/06/2009  . Personal history of colonic adenoma 11/30/2002   Kerin Perna, PTA 01/23/2016 5:07 PM  [place], [place], [address] Phone: [phone]   Fax:  [phone]  Name: Lindsay Hodges MRN: [id number] Date of Birth: 1956/08/22

## 2016-01-26 ENCOUNTER — Ambulatory Visit (INDEPENDENT_AMBULATORY_CARE_PROVIDER_SITE_OTHER): Payer: BLUE CROSS/BLUE SHIELD | Admitting: Physical Therapy

## 2016-01-26 DIAGNOSIS — M25551 Pain in right hip: Secondary | ICD-10-CM | POA: Diagnosis not present

## 2016-01-26 DIAGNOSIS — M545 Low back pain, unspecified: Secondary | ICD-10-CM

## 2016-01-26 DIAGNOSIS — R2689 Other abnormalities of gait and mobility: Secondary | ICD-10-CM

## 2016-01-26 DIAGNOSIS — M25651 Stiffness of right hip, not elsewhere classified: Secondary | ICD-10-CM

## 2016-01-26 NOTE — Therapy (Addendum)
[place], [place], [address] Phone: [phone]   Fax:  [phone]  Physical Therapy Treatment  Patient Details  Name: Lindsay Hodges MRN: [id number] Date of Birth: 02-09-1956 Referring Provider: Dr. Georgina Snell   Encounter Date: 01/26/2016      PT End of Session - 01/26/16 1532    Visit Number 3   Number of Visits 12   Date for PT Re-Evaluation 02/29/16   PT Start Time [id number]   PT Stop Time 1636   PT Time Calculation (min) 63 min   Activity Tolerance Patient tolerated treatment well  slight increase in pain with sidelyine for exercise and [place].; reduced with position change      Past Medical History  Diagnosis Date  . Carotid disease, bilateral ([place])     [place] 10/15: R 40-59%, L 50-69%, stable for years  . Elevated sed rate     remote history of myelosuppressive disorder  . Hyperlipidemia   . Hypertension   . Grave's disease     s/p I-131 ablation 1990s  . Arthritis   . HYPOTHYROIDISM   . Vitamin D deficiency   . Personal history of colonic adenoma 11/30/2002  . Bursitis of hip 08/02/15  . Preventative health care 09/04/2015    Past Surgical History  Procedure Laterality Date  . Jaw surgery  2001  . Oophorectomy Bilateral   . Vaginal hysterectomy  2009    vaginal hysterectomy    There were no vitals filed for this visit.      Subjective Assessment - 01/26/16 1538    Subjective Pt reports her Rt hip is not as painful. She has been using tennis ball for self massage to Rt hip and additional exercises. She feels her Rt knee feels weak; still unable to go stairs easily. Her Lt plantar fascia still painful in mornings when awaking.    Currently in Pain? Yes   Pain Score 2    Pain Location Hip   Pain Orientation Right          [facility] Adult PT Treatment/Exercise - 01/26/16 0001    Knee/Hip Exercises: Stretches   Passive Hamstring Stretch Right;3 reps;30 seconds   Quad Stretch Right;2  reps;30 seconds   ITB Stretch Right;2 reps;20 seconds   Piriformis Stretch Right;Left;30 seconds;4 reps   Gastroc Stretch Right;Left;2 reps;30 seconds   Other Knee/Hip Stretches Lt plantar fascia x 20 sec x 3 reps   Knee/Hip Exercises: Standing   Forward Step Up Right;1 set;10 reps;Hand Hold: 2;Step Height: 6"   Forward Step Up Limitations slow and controlled, audible crepitus   Step Down Hand Hold: 2;5 reps  3" step   Step Down Limitations (heel tap)   Wall Squat 1 set;10 reps   Knee/Hip Exercises: Sidelying   Hip ABduction 2 sets;Right   Clams 10 rep RLE   Modalities   Modalities Ultrasound   Moist Heat Therapy   Number Minutes Moist Heat --  pt declined, will at home.    Acupuncturist Location --  pt declined.    Ultrasound   Ultrasound Location Rt lateral hip   Ultrasound Parameters 100%, 1.4 w/cm2, 52min   Ultrasound Goals Pain   Manual Therapy   Manual Therapy Taping   Manual therapy comments tape applied to reduce pain, provide support, and decompress tissue.    Kinesiotex --  Rock tape to Lt plantar fascia (both calcaneus to metatarsal heads, and perpendicular strips to support arches) and  Dynamic to Rt knee (to assist in improve tracking; strip on lateral border in C shape around patella and strip from lateral to medial)           PT Long Term Goals - 01/26/16 1649    PT LONG TERM GOAL #1   Title Independent with HEP   Time 6   Period Weeks   Status On-going   PT LONG TERM GOAL #2   Title increase walking tolerance to 3 miles without increased hip pain   Time 6   Period Weeks   Status On-going   PT LONG TERM GOAL #3   Title demonstrate a painfree single leg stance on the R   Time 6   Period Weeks   Status On-going               Plan - 01/26/16 1647    Rehab Potential Excellent   PT Frequency 2x / week   PT Duration 6 weeks   PT Treatment/Interventions Electrical Stimulation;Cryotherapy;Iontophoresis 4mg /ml  Dexamethasone;Moist Heat;Ultrasound;Gait training;Therapeutic activities;Therapeutic exercise;Neuromuscular re-education;Patient/family education;Manual techniques;Taping   PT Next Visit Plan Continue progressive hip strengthening, LE stretching. Assess response to [place] and taping.    Consulted and Agree with Plan of Care Patient      Patient will benefit from skilled therapeutic intervention in order to improve the following deficits and impairments:  Decreased activity tolerance, Pain, Decreased range of motion, Decreased strength  Visit Diagnosis: Pain in right hip  Other abnormalities of gait and mobility  Hip stiffness, right  Right-sided low back pain without sciatica     Problem List Patient Active Problem List   Diagnosis Date Noted  . Plantar fasciitis, left 01/10/2016  . Preventative health care 09/04/2015  . Sun-damaged skin 09/04/2015  . Low back pain radiating to both legs 03/30/2015  . UTI symptoms 03/16/2015  . Trochanteric bursitis of right hip 02/14/2015  . Right knee pain 03/26/2014  . Seasonal affective disorder ([place]) 11/25/2012  . Vitamin D deficiency 02/14/2012  . Hyperlipidemia   . Hypertension   . Graves' disease   . Arthritis   . Carotid disease, bilateral ([place])   . CAROTID BRUIT, LEFT 04/11/2009  . Hypothyroidism 01/06/2009  . Personal history of colonic adenoma 11/30/2002   Kerin Perna, PTA 01/26/2016 4:51 PM  [place], [place], [address] Phone: [phone]   Fax:  [phone]  Name: Sherree Wichman MRN: [id number] Date of Birth: 23-Jan-1956

## 2016-02-09 ENCOUNTER — Ambulatory Visit (INDEPENDENT_AMBULATORY_CARE_PROVIDER_SITE_OTHER): Payer: BLUE CROSS/BLUE SHIELD | Admitting: Physical Therapy

## 2016-02-09 DIAGNOSIS — M25651 Stiffness of right hip, not elsewhere classified: Secondary | ICD-10-CM | POA: Diagnosis not present

## 2016-02-09 DIAGNOSIS — M25551 Pain in right hip: Secondary | ICD-10-CM

## 2016-02-09 DIAGNOSIS — R2689 Other abnormalities of gait and mobility: Secondary | ICD-10-CM

## 2016-02-09 DIAGNOSIS — M545 Low back pain, unspecified: Secondary | ICD-10-CM

## 2016-02-09 NOTE — Patient Instructions (Signed)
Forward Lunge    Standing with feet shoulder width apart and stomach tight, step forward with left leg.  (can bend both knees) Repeat _10___ times per set. Do __1-2__ sets per session. Do __3__ sessions per week.  Lunge to Side    Take large step to side moving arms and body with leg. Other leg remains straight. Return by straightening knee and ankle and bring foot to other. Repeat __10__ each side to complete a session. Do __3__ sessions per week.   [facility] at [facility] [place], [place] [address]  [phone] (office) [phone] (fax)

## 2016-02-10 ENCOUNTER — Other Ambulatory Visit: Payer: Self-pay | Admitting: Family Medicine

## 2016-02-10 NOTE — Therapy (Addendum)
[place], [place], [address] Phone: [phone]   Fax:  [phone]  Physical Therapy Treatment  Patient Details  Name: Francy Mcilvaine MRN: [id number] Date of Birth: 11/17/1955 Referring Provider: Dr. Georgina Snell   Encounter Date: 02/09/2016      PT End of Session - 02/09/16 1548    Visit Number 4   Number of Visits 12   Date for PT Re-Evaluation 02/29/16   PT Start Time 3295  pt arrived late   PT Stop Time 1630   PT Time Calculation (min) 45 min      Past Medical History  Diagnosis Date  . Carotid disease, bilateral ([place])     [place] 10/15: R 40-59%, L 50-69%, stable for years  . Elevated sed rate     remote history of myelosuppressive disorder  . Hyperlipidemia   . Hypertension   . Grave's disease     s/p I-131 ablation 1990s  . Arthritis   . HYPOTHYROIDISM   . Vitamin D deficiency   . Personal history of colonic adenoma 11/30/2002  . Bursitis of hip 08/02/15  . Preventative health care 09/04/2015    Past Surgical History  Procedure Laterality Date  . Jaw surgery  2001  . Oophorectomy Bilateral   . Vaginal hysterectomy  2009    vaginal hysterectomy    There were no vitals filed for this visit.      Subjective Assessment - 02/09/16 1553    Subjective Pt just returned from trip, did lots of walking and stairs.  Notices the Rt LE not as strong as LLE.  Bought inserts, which has helped her arches. Unsure if the ultrasound to hip helped.  She feels the estim and tape helped her.    Pertinent History R knee injury x 2 years ago, L sided plantar fasciitis   Currently in Pain? Yes   Pain Score 4    Pain Location Hip   Pain Orientation Right   Pain Descriptors / Indicators Dull   Aggravating Factors  ????   Pain Relieving Factors finding the right position to sleep             [facility] PT Assessment - 02/10/16 0001    Assessment   Medical Diagnosis R hip bursitis   Referring Provider  Dr. Georgina Snell    Onset Date/Surgical Date 07/02/15   Strength   Right Knee Flexion 5/5   Right Knee Extension 5/5   Left Knee Flexion 5/5   Left Knee Extension 5/5           [facility] Adult PT Treatment/Exercise - 02/10/16 0001    Knee/Hip Exercises: Stretches   Passive Hamstring Stretch Right;3 reps;30 seconds   Quad Stretch Right;2 reps;30 seconds   Piriformis Stretch Right;Left;30 seconds;4 reps   Gastroc Stretch Right;Left;2 reps;30 seconds   Other Knee/Hip Stretches Lt plantar fascia x 20 sec x 3 reps   Knee/Hip Exercises: Aerobic   Nustep L5: 5 min    Knee/Hip Exercises: Standing   Heel Raises Both;2 sets;10 reps  eccentric control   Forward Lunges Right;Left;1 set;10 reps   Side Lunges Right;Left;1 set;10 reps   Wall Squat 1 set;10 reps;5 seconds   Modalities   Modalities Electrical Stimulation;Moist Heat   Moist Heat Therapy   Number Minutes Moist Heat 15 Minutes   Moist Heat Location Hip   Electrical Stimulation   Electrical Stimulation Location Rt hip    Electrical Stimulation Action IFC   Electrical Stimulation Parameters  to tolerance    Electrical Stimulation Goals Pain;Tone          PT Long Term Goals - 01/26/16 1649    PT LONG TERM GOAL #1   Title Independent with HEP   Time 6   Period Weeks   Status On-going   PT LONG TERM GOAL #2   Title increase walking tolerance to 3 miles without increased hip pain   Time 6   Period Weeks   Status On-going   PT LONG TERM GOAL #3   Title demonstrate a painfree single leg stance on the R   Time 6   Period Weeks   Status On-going               Plan - 02/09/16 6168    Clinical Impression Statement Pt had relief with tape to knee and plantar fascia, but declined application today.  Pt demonstrated improved LE strength, but demonstrates functional weakness in RLE in standing.  Pt tolerated all exercises well, without increase in pain.  Pt reported decrease in hip discomfort with use of estim and MHP at end of  session.  Progressing well towards goals.    Rehab Potential Excellent   PT Frequency 2x / week   PT Duration 6 weeks   PT Treatment/Interventions Electrical Stimulation;Cryotherapy;Iontophoresis 11m/ml Dexamethasone;Moist Heat;Ultrasound;Gait training;Therapeutic activities;Therapeutic exercise;Neuromuscular re-education;Patient/family education;Manual techniques;Taping   PT Next Visit Plan Pt would like to await further instructions from MD (upcoming visit) prior to returning for additional therapy visits.  If returning, will continue strengthening for LE. Modalities and manual therapy as indicated.    Consulted and Agree with Plan of Care Patient      Patient will benefit from skilled therapeutic intervention in order to improve the following deficits and impairments:  Decreased activity tolerance, Pain, Decreased range of motion, Decreased strength  Visit Diagnosis: Pain in right hip  Other abnormalities of gait and mobility  Hip stiffness, right  Right-sided low back pain without sciatica     Problem List Patient Active Problem List   Diagnosis Date Noted  . Plantar fasciitis, left 01/10/2016  . Preventative health care 09/04/2015  . Sun-damaged skin 09/04/2015  . Low back pain radiating to both legs 03/30/2015  . UTI symptoms 03/16/2015  . Trochanteric bursitis of right hip 02/14/2015  . Right knee pain 03/26/2014  . [place]al affective disorder ([place] 11/25/2012  . Vitamin D deficiency 02/14/2012  . Hyperlipidemia   . Hypertension   . Graves' disease   . Arthritis   . Car[place] disease, bilateral ([place]   . CAROTID BRUIT, LEFT 04/11/2009  . Hypothyroidism 01/06/2009  . Personal history of colonic adenoma 11/30/2002   JKerin Perna PTA 02/10/2016 8:32 AM  [place] [address]Phone: [phone]  Fax:  [phone] Name: JCharnele SempleMRN: [id number] of  Birth: [birth year]   PHYSICAL THERAPY DISCHARGE SUMMARY  Visits from Start of Care: 4  Current functional level related to goals / functional outcomes: Unknown   Remaining deficits: Unknown   Education / Equipment: HEP Plan: Patient agrees to discharge.  Patient goals were not met. Patient is being discharged due to the patient's request. Patient wished to return to MD to discuss progress and other options ?????     SJeral Pinch PT 03/22/2016 10:50 AM

## 2016-02-14 ENCOUNTER — Ambulatory Visit (INDEPENDENT_AMBULATORY_CARE_PROVIDER_SITE_OTHER): Payer: BLUE CROSS/BLUE SHIELD | Admitting: Family Medicine

## 2016-02-14 VITALS — BP 131/73 | HR 75

## 2016-02-14 DIAGNOSIS — M722 Plantar fascial fibromatosis: Secondary | ICD-10-CM

## 2016-02-14 DIAGNOSIS — M7061 Trochanteric bursitis, right hip: Secondary | ICD-10-CM | POA: Diagnosis not present

## 2016-02-14 NOTE — Patient Instructions (Signed)
Thank you for coming in today. Return as needed.  Take tylenol arthritis for pain as needed.  Use ibuprofen for severe pain.

## 2016-02-14 NOTE — Progress Notes (Signed)
       Lindsay Hodges is a 60 y.o. female who presents to [facility] today for follow-up hip bursitis. Patient was seen in April for right trochanteric bursitis. She's received several physical therapy visits and feels much better. She is nearly pain-free. She was seen for plantar fasciitis which is also improved with physical therapy and sports insoles. She is quite satisfied with how she's feeling.   Past Medical History  Diagnosis Date  . Carotid disease, bilateral ([place])     [place] 10/15: R 40-59%, L 50-69%, stable for years  . Elevated sed rate     remote history of myelosuppressive disorder  . Hyperlipidemia   . Hypertension   . Grave's disease     s/p I-131 ablation 1990s  . Arthritis   . HYPOTHYROIDISM   . Vitamin D deficiency   . Personal history of colonic adenoma 11/30/2002  . Bursitis of hip 08/02/15  . Preventative health care 09/04/2015   Past Surgical History  Procedure Laterality Date  . Jaw surgery  2001  . Oophorectomy Bilateral   . Vaginal hysterectomy  2009    vaginal hysterectomy   Social History  Substance Use Topics  . Smoking status: Former Smoker -- 0.50 packs/day for 12 years    Quit date: 02/06/1993  . Smokeless tobacco: Never Used  . Alcohol Use: No   family history includes Arthritis in her father and mother; Atrial fibrillation in her brother and mother; Breast cancer in her other and sister; Cancer in her sister; Drug abuse in her maternal aunt; Epilepsy in her mother; Hyperlipidemia in her mother; Hypertension in her mother; Stroke in her mother. There is no history of Diabetes or Heart attack.  ROS as above Medications: Current Outpatient Prescriptions  Medication Sig Dispense Refill  . aspirin 81 MG tablet Take 81 mg by mouth daily.      Marland Kitchen SYNTHROID 50 MCG tablet TAKE 1 TABLET BY MOUTH ON SATURDAYS ONLY 30 tablet 2  . SYNTHROID 75 MCG tablet TAKE 1  TABLET EVERY DAY EXCEPT SATURDAYS 90 tablet 0   No current facility-administered medications for this visit.   Allergies  Allergen Reactions  . Penicillins     REACTION: swelling/dyspnea     Exam:  BP 131/73 mmHg  Pulse 75  Wt  Gen: Well NAD Hip and feet are normal-appearing with normal motion and gait.  No results found for this or any previous visit (from the past 24 hour(s)). No results found.   60 year old woman with resolving trochanteric bursitis and plantar fasciitis. Plan for physical therapy as needed. Return as needed.

## 2016-04-02 ENCOUNTER — Other Ambulatory Visit (HOSPITAL_COMMUNITY)
Admission: RE | Admit: 2016-04-02 | Discharge: 2016-04-02 | Disposition: A | Discharge status: Home / Self Care | Payer: BLUE CROSS/BLUE SHIELD | Source: Ambulatory Visit | Attending: Family Medicine | Admitting: Family Medicine

## 2016-04-02 ENCOUNTER — Encounter: Payer: Self-pay | Admitting: Family Medicine

## 2016-04-02 ENCOUNTER — Ambulatory Visit (INDEPENDENT_AMBULATORY_CARE_PROVIDER_SITE_OTHER): Payer: BLUE CROSS/BLUE SHIELD | Admitting: Family Medicine

## 2016-04-02 VITALS — BP 130/72 | HR 80 | Temp 98.1°F | Ht 65.0 in | Wt 160.1 lb

## 2016-04-02 DIAGNOSIS — E559 Vitamin D deficiency, unspecified: Secondary | ICD-10-CM | POA: Diagnosis not present

## 2016-04-02 DIAGNOSIS — M545 Low back pain, unspecified: Secondary | ICD-10-CM

## 2016-04-02 DIAGNOSIS — R399 Unspecified symptoms and signs involving the genitourinary system: Secondary | ICD-10-CM

## 2016-04-02 DIAGNOSIS — E039 Hypothyroidism, unspecified: Secondary | ICD-10-CM

## 2016-04-02 DIAGNOSIS — M25562 Pain in left knee: Secondary | ICD-10-CM

## 2016-04-02 DIAGNOSIS — R8290 Unspecified abnormal findings in urine: Secondary | ICD-10-CM

## 2016-04-02 DIAGNOSIS — N760 Acute vaginitis: Secondary | ICD-10-CM | POA: Diagnosis present

## 2016-04-02 DIAGNOSIS — E785 Hyperlipidemia, unspecified: Secondary | ICD-10-CM

## 2016-04-02 DIAGNOSIS — I10 Essential (primary) hypertension: Secondary | ICD-10-CM

## 2016-04-02 LAB — POCT URINALYSIS DIPSTICK
Bilirubin, UA: NEGATIVE
Blood, UA: NEGATIVE
GLUCOSE UA: NEGATIVE
Ketones, UA: NEGATIVE
NITRITE UA: NEGATIVE
PROTEIN UA: NEGATIVE
Spec Grav, UA: >=1.03
UROBILINOGEN UA: 2
pH, UA: 6

## 2016-04-02 NOTE — Patient Instructions (Addendum)
curcumen caps, NOW company at [email or web address] NOW probiotic 10 strain daily   Hypertension Hypertension, commonly called high blood pressure, is when the force of blood pumping through your arteries is too strong. Your arteries are the blood vessels that carry blood from your heart throughout your body. A blood pressure reading consists of a higher number over a lower number, such as 110/72. The higher number (systolic) is the pressure inside your arteries when your heart pumps. The lower number (diastolic) is the pressure inside your arteries when your heart relaxes. Ideally you want your blood pressure below 120/80. Hypertension forces your heart to work harder to pump blood. Your arteries may become narrow or stiff. Having untreated or uncontrolled hypertension can cause heart attack, stroke, kidney disease, and other problems. RISK FACTORS Some risk factors for high blood pressure are controllable. Others are not.  Risk factors you cannot control include:   Race. You may be at higher risk if you are African American.  Age. Risk increases with age.  Gender. Men are at higher risk than women before age 83 years. After age 36, women are at higher risk than men. Risk factors you can control include:  Not getting enough exercise or physical activity.  Being overweight.  Getting too much fat, sugar, calories, or salt in your diet.  Drinking too much alcohol. SIGNS AND SYMPTOMS Hypertension does not usually cause signs or symptoms. Extremely high blood pressure (hypertensive crisis) may cause headache, anxiety, shortness of breath, and nosebleed. DIAGNOSIS To check if you have hypertension, your health care provider will measure your blood pressure while you are seated, with your arm held at the level of your heart. It should be measured at least twice using the same arm. Certain conditions can cause a difference in blood pressure between your right and left arms. A blood pressure reading  that is higher than normal on one occasion does not mean that you need treatment. If it is not clear whether you have high blood pressure, you may be asked to return on a different day to have your blood pressure checked again. Or, you may be asked to monitor your blood pressure at home for 1 or more weeks. TREATMENT Treating high blood pressure includes making lifestyle changes and possibly taking medicine. Living a healthy lifestyle can help lower high blood pressure. You may need to change some of your habits. Lifestyle changes may include:  Following the DASH diet. This diet is high in fruits, vegetables, and whole grains. It is low in salt, red meat, and added sugars.  Keep your sodium intake below 2,300 mg per day.  Getting at least 30-45 minutes of aerobic exercise at least 4 times per week.  Losing weight if necessary.  Not smoking.  Limiting alcoholic beverages.  Learning ways to reduce stress. Your health care provider may prescribe medicine if lifestyle changes are not enough to get your blood pressure under control, and if one of the following is true:  You are 77-41 years of age and your systolic blood pressure is above 140.  You are 24 years of age or older, and your systolic blood pressure is above 150.  Your diastolic blood pressure is above 90.  You have diabetes, and your systolic blood pressure is over [id number] or your diastolic blood pressure is over 90.  You have kidney disease and your blood pressure is above 140/90.  You have heart disease and your blood pressure is above 140/90. Your personal target blood pressure may vary  depending on your medical conditions, your age, and other factors. HOME CARE INSTRUCTIONS  Have your blood pressure rechecked as directed by your health care provider.   Take medicines only as directed by your health care provider. Follow the directions carefully. Blood pressure medicines must be taken as prescribed. The medicine does not  work as well when you skip doses. Skipping doses also puts you at risk for problems.  Do not smoke.   Monitor your blood pressure at home as directed by your health care provider. SEEK MEDICAL CARE IF:   You think you are having a reaction to medicines taken.  You have recurrent headaches or feel dizzy.  You have swelling in your ankles.  You have trouble with your vision. SEEK IMMEDIATE MEDICAL CARE IF:  You develop a severe headache or confusion.  You have unusual weakness, numbness, or feel faint.  You have severe chest or abdominal pain.  You vomit repeatedly.  You have trouble breathing. MAKE SURE YOU:   Understand these instructions.  Will watch your condition.  Will get help right away if you are not doing well or get worse.   This information is not intended to replace advice given to you by your health care provider. Make sure you discuss any questions you have with your health care provider.   Document Released: 09/17/2005 Document Revised: 02/01/2015 Document Reviewed: 07/10/2013 Elsevier Interactive Patient Education [employer].

## 2016-04-04 ENCOUNTER — Other Ambulatory Visit (INDEPENDENT_AMBULATORY_CARE_PROVIDER_SITE_OTHER): Payer: BLUE CROSS/BLUE SHIELD

## 2016-04-04 DIAGNOSIS — E785 Hyperlipidemia, unspecified: Secondary | ICD-10-CM | POA: Diagnosis not present

## 2016-04-04 LAB — COMPREHENSIVE METABOLIC PANEL
ALBUMIN: 4.2 g/dL (ref 3.6–5.1)
ALK PHOS: 75 U/L (ref 33–130)
ALT: 16 U/L (ref 6–29)
AST: 17 U/L (ref 10–35)
BILIRUBIN TOTAL: 0.4 mg/dL (ref 0.2–1.2)
BUN: 17 mg/dL (ref 7–25)
CALCIUM: 9.2 mg/dL (ref 8.6–10.4)
CO2: 27 mmol/L (ref 20–31)
Chloride: 106 mmol/L (ref 98–110)
Creat: 0.6 mg/dL (ref 0.50–0.99)
Glucose, Bld: 88 mg/dL (ref 65–99)
POTASSIUM: 4.8 mmol/L (ref 3.5–5.3)
Sodium: 140 mmol/L (ref 135–146)
TOTAL PROTEIN: 6.6 g/dL (ref 6.1–8.1)

## 2016-04-04 LAB — CULTURE, URINE COMPREHENSIVE

## 2016-04-04 LAB — LIPID PANEL
CHOLESTEROL: 209 mg/dL — AB (ref 125–200)
HDL: 58 mg/dL (ref >=46)
LDL Cholesterol: 130 mg/dL — ABNORMAL HIGH (ref <130)
TRIGLYCERIDES: 107 mg/dL (ref <150)
Total CHOL/HDL Ratio: 3.6 Ratio (ref <=5.0)
VLDL: 21 mg/dL (ref <30)

## 2016-04-04 LAB — CBC
HCT: 38.9 % (ref 35.0–45.0)
HEMOGLOBIN: 12.7 g/dL (ref 11.7–15.5)
MCH: 28.8 pg (ref 27.0–33.0)
MCHC: 32.6 g/dL (ref 32.0–36.0)
MCV: 88.2 fL (ref 80.0–100.0)
MPV: 9.9 fL (ref 7.5–12.5)
PLATELETS: 226 10*3/uL (ref 140–400)
RBC: 4.41 MIL/uL (ref 3.80–5.10)
RDW: 13.8 % (ref 11.0–15.0)
WBC: 6 10*3/uL (ref 3.8–10.8)

## 2016-04-04 LAB — RHEUMATOID FACTOR: Rhuematoid fact SerPl-aCnc: <10 IU/mL (ref <=14)

## 2016-04-04 LAB — HEMOGLOBIN A1C
HEMOGLOBIN A1C: 5.8 % — AB (ref <5.7)
Mean Plasma Glucose: 120 mg/dL

## 2016-04-04 LAB — TSH: TSH: 4.64 m[IU]/L — AB

## 2016-04-04 LAB — C-REACTIVE PROTEIN

## 2016-04-05 ENCOUNTER — Other Ambulatory Visit: Payer: Self-pay | Admitting: Family Medicine

## 2016-04-05 LAB — VITAMIN D 25 HYDROXY (VIT D DEFICIENCY, FRACTURES): Vit D, 25-Hydroxy: 20 ng/mL — ABNORMAL LOW (ref 30–100)

## 2016-04-05 LAB — ANA: ANA: NEGATIVE

## 2016-04-05 MED ORDER — ERGOCALCIFEROL 1.25 MG (50000 UT) PO CAPS: 50000.0000 [IU] | ORAL_CAPSULE | ORAL | Status: DC

## 2016-04-05 MED ORDER — SYNTHROID 75 MCG PO TABS: 75.0000 ug | ORAL_TABLET | Freq: Every day | ORAL | Status: DC

## 2016-04-06 LAB — URINE CYTOLOGY ANCILLARY ONLY
Bacterial vaginitis: NEGATIVE
CANDIDA VAGINITIS: NEGATIVE

## 2016-04-08 NOTE — Progress Notes (Signed)
Patient ID: Lindsay Hodges, female   DOB: 1956-06-28, 60 y.o.   MRN: [id number]   Subjective:    Patient ID: Lindsay Hodges, female    DOB: [birth year], 60 y.o.   MRN: [id number]  Chief Complaint  Patient presents with  . Follow-up    HPI Patient is in today for follow up. No recent illness, acute concerns or hospitalizations. Is complaining of numerous pains, right hip and left knee are the most prominent. No recent fall or trauma. Notes some stiffness and pain in left knee in am. Denies CP/palp/SOB/HA/congestion/fevers/GI or GU c/o. Taking meds as prescribed  Past Medical History  Diagnosis Date  . Carotid disease, bilateral ([place])     [place] 10/15: R 40-59%, L 50-69%, stable for years  . Elevated sed rate     remote history of myelosuppressive disorder  . Hyperlipidemia   . Hypertension   . Grave's disease     s/p I-131 ablation 1990s  . Arthritis   . HYPOTHYROIDISM   . Vitamin D deficiency   . Personal history of colonic adenoma 11/30/2002  . Bursitis of hip 08/02/15  . Preventative health care 09/04/2015  . Left knee pain 03/26/2014    Past Surgical History  Procedure Laterality Date  . Jaw surgery  2001  . Oophorectomy Bilateral   . Vaginal hysterectomy  2009    vaginal hysterectomy    Family History  Problem Relation Age of Onset  . Hypertension Mother   . Arthritis Mother   . Hyperlipidemia Mother   . Stroke Mother     1st age 37, then recurrent 16  . Atrial fibrillation Mother   . Epilepsy Mother   . Diabetes Neg Hx   . Heart attack Neg Hx   . Arthritis Father   . Breast cancer Other   . Breast cancer Sister   . Cancer Sister     Recurrent Breast CA  . Atrial fibrillation Brother   . Drug abuse Maternal Aunt     Social History   Social History  . Marital Status: Married    Spouse Name: N/A  . Number of Children: N/A  . Years of Education: N/A   Occupational History  .      [employer]   Social History Main Topics  . Smoking status: Former  Smoker -- 0.50 packs/day for 12 years    Quit date: 02/06/1993  . Smokeless tobacco: Never Used  . Alcohol Use: No  . Drug Use: No  . Sexual Activity: Yes   Other Topics Concern  . Not on file   Social History Narrative   pharmacy tech III    Outpatient Prescriptions Prior to Visit  Medication Sig Dispense Refill  . aspirin 81 MG tablet Take 81 mg by mouth daily.      Marland Kitchen SYNTHROID 50 MCG tablet TAKE 1 TABLET BY MOUTH ON SATURDAYS ONLY (Patient not taking: Reported on 04/05/2016) 30 tablet 2  . SYNTHROID 75 MCG tablet TAKE 1 TABLET EVERY DAY EXCEPT SATURDAYS (Patient taking differently: No sig reported) 90 tablet 0   No facility-administered medications prior to visit.    Allergies  Allergen Reactions  . Penicillins     REACTION: swelling/dyspnea    Review of Systems  Constitutional: Negative for fever and malaise/fatigue.  HENT: Negative for congestion.   Eyes: Negative for blurred vision.  Respiratory: Negative for shortness of breath.   Cardiovascular: Negative for chest pain, palpitations and leg swelling.  Gastrointestinal: Negative for nausea, abdominal  pain and blood in stool.  Genitourinary: Negative for dysuria and frequency.  Musculoskeletal: Positive for back pain and joint pain. Negative for falls.  Skin: Negative for rash.  Neurological: Negative for dizziness, loss of consciousness and headaches.  Endo/Heme/Allergies: Negative for environmental allergies.  Psychiatric/Behavioral: Negative for depression. The patient is not nervous/anxious.        Objective:    Physical Exam  Constitutional: She is oriented to person, place, and time. She appears well-developed and well-nourished. No distress.  HENT:  Head: Normocephalic and atraumatic.  Nose: Nose normal.  Eyes: Right eye exhibits no discharge. Left eye exhibits no discharge.  Neck: Normal range of motion. Neck supple.  Cardiovascular: Normal rate and regular rhythm.   No murmur  heard. Pulmonary/Chest: Effort normal and breath sounds normal.  Abdominal: Soft. Bowel sounds are normal. There is no tenderness.  Musculoskeletal: She exhibits no edema.  Neurological: She is alert and oriented to person, place, and time.  Skin: Skin is warm and dry.  Psychiatric: She has a normal mood and affect.  Nursing note and vitals reviewed.   BP 130/72 mmHg  Pulse 80  Temp(Src) 98.1 F (36.7 C) (Oral)  Ht 5\' 5"  (1.651 m)  Wt 160 lb 2 oz (72.632 kg)  BMI 26.65 kg/m2  SpO2 96% Wt Readings from Last 3 Encounters:  04/02/16 160 lb 2 oz (72.632 kg)  10/04/15 161 lb (73.029 kg)  09/27/15 161 lb 6.4 oz (73.211 kg)     Lab Results  Component Value Date   WBC 6.0 04/04/2016   HGB 12.7 04/04/2016   HCT 38.9 04/04/2016   PLT 226 04/04/2016   GLUCOSE 88 04/04/2016   CHOL 209* 04/04/2016   TRIG 107 04/04/2016   HDL 58 04/04/2016   LDLCALC 130* 04/04/2016   ALT 16 04/04/2016   AST 17 04/04/2016   NA 140 04/04/2016   K 4.8 04/04/2016   CL 106 04/04/2016   CREATININE 0.60 04/04/2016   BUN 17 04/04/2016   CO2 27 04/04/2016   TSH 4.64* 04/04/2016   HGBA1C 5.8* 04/04/2016    Lab Results  Component Value Date   TSH 4.64* 04/04/2016   Lab Results  Component Value Date   WBC 6.0 04/04/2016   HGB 12.7 04/04/2016   HCT 38.9 04/04/2016   MCV 88.2 04/04/2016   PLT 226 04/04/2016   Lab Results  Component Value Date   NA 140 04/04/2016   K 4.8 04/04/2016   CO2 27 04/04/2016   GLUCOSE 88 04/04/2016   BUN 17 04/04/2016   CREATININE 0.60 04/04/2016   BILITOT 0.4 04/04/2016   ALKPHOS 75 04/04/2016   AST 17 04/04/2016   ALT 16 04/04/2016   PROT 6.6 04/04/2016   ALBUMIN 4.2 04/04/2016   CALCIUM 9.2 04/04/2016   GFR 87.33 09/01/2015   Lab Results  Component Value Date   CHOL 209* 04/04/2016   Lab Results  Component Value Date   HDL 58 04/04/2016   Lab Results  Component Value Date   LDLCALC 130* 04/04/2016   Lab Results  Component Value Date   TRIG  107 04/04/2016   Lab Results  Component Value Date   CHOLHDL 3.6 04/04/2016   Lab Results  Component Value Date   HGBA1C 5.8* 04/04/2016       Assessment & Plan:   Problem List Items Addressed This Visit    Hypothyroidism    On Levothyroxine, continue to monitor      Relevant Orders   TSH (Completed)  CBC (Completed)   Lipid panel (Completed)   Comprehensive metabolic panel (Completed)   Hemoglobin A1c (Completed)   C-reactive protein (Completed)   ANA (Completed)   Rheumatoid factor (Completed)   Vitamin D (25 hydroxy) (Completed)   Hyperlipidemia    Encouraged heart healthy diet, increase exercise, avoid trans fats, consider a krill oil cap daily      Relevant Medications   losartan (COZAAR) 50 MG tablet   Other Relevant Orders   TSH (Completed)   CBC (Completed)   Lipid panel (Completed)   Comprehensive metabolic panel (Completed)   Hemoglobin A1c (Completed)   C-reactive protein (Completed)   ANA (Completed)   Rheumatoid factor (Completed)   Vitamin D (25 hydroxy) (Completed)   Hypertension    Well controlled, no changes to meds. Encouraged heart healthy diet such as the DASH diet and exercise as tolerated.       Relevant Medications   losartan (COZAAR) 50 MG tablet   Other Relevant Orders   TSH (Completed)   CBC (Completed)   Lipid panel (Completed)   Comprehensive metabolic panel (Completed)   Hemoglobin A1c (Completed)   C-reactive protein (Completed)   ANA (Completed)   Rheumatoid factor (Completed)   Vitamin D (25 hydroxy) (Completed)   Vitamin D deficiency    Labs reveal deficiency. Start on Vitamin D 50000 IU caps, 1 cap po weekly x 12 weeks. Disp #4 with 4 rf. Also take daily Vitamin D over the counter. If already taking a daily supplement increase by 1000 IU daily and if not start Vitamin D 2000 IU daily.       Relevant Orders   TSH (Completed)   CBC (Completed)   Lipid panel (Completed)   Comprehensive metabolic panel (Completed)    Hemoglobin A1c (Completed)   C-reactive protein (Completed)   ANA (Completed)   Rheumatoid factor (Completed)   Vitamin D (25 hydroxy) (Completed)   Left knee pain    Stiff and sore especially after prolonged immobility. Encouraged moist heat and gentle stretching as tolerated. May try NSAIDs and prescription meds as directed and report if symptoms worsen or seek immediate care. Stay active. Try topical treatments. Check labs      Relevant Orders   TSH (Completed)   CBC (Completed)   Lipid panel (Completed)   Comprehensive metabolic panel (Completed)   Hemoglobin A1c (Completed)   C-reactive protein (Completed)   ANA (Completed)   Rheumatoid factor (Completed)   Vitamin D (25 hydroxy) (Completed)   UTI symptoms   Relevant Orders   TSH (Completed)   CBC (Completed)   Lipid panel (Completed)   Comprehensive metabolic panel (Completed)   Hemoglobin A1c (Completed)   C-reactive protein (Completed)   ANA (Completed)   Rheumatoid factor (Completed)   Vitamin D (25 hydroxy) (Completed)   Low back pain radiating to both legs   Relevant Orders   TSH (Completed)   CBC (Completed)   Lipid panel (Completed)   Comprehensive metabolic panel (Completed)   Hemoglobin A1c (Completed)   C-reactive protein (Completed)   ANA (Completed)   Rheumatoid factor (Completed)   Vitamin D (25 hydroxy) (Completed)    Other Visit Diagnoses    Abnormal urine odor    -  Primary    Relevant Orders    POCT Urinalysis Dipstick (Completed)    CULTURE, URINE COMPREHENSIVE (Completed)    TSH (Completed)    CBC (Completed)    Lipid panel (Completed)    Comprehensive metabolic panel (Completed)    Hemoglobin  A1c (Completed)    C-reactive protein (Completed)    ANA (Completed)    Rheumatoid factor (Completed)    Vitamin D (25 hydroxy) (Completed)    Vaginitis and vulvovaginitis        Relevant Orders    Urine cytology ancillary only (Completed)       I am having Ms. Joles maintain her aspirin,  SYNTHROID, and losartan.  Meds ordered this encounter  Medications  . losartan (COZAAR) 50 MG tablet    Sig: Take 1 tablet by mouth daily.     Penni Homans, MD

## 2016-04-08 NOTE — Assessment & Plan Note (Signed)
Encouraged heart healthy diet, increase exercise, avoid trans fats, consider a krill oil cap daily 

## 2016-04-08 NOTE — Assessment & Plan Note (Signed)
Labs reveal deficiency. Start on Vitamin D 50000 IU caps, 1 cap po weekly x 12 weeks. Disp #4 with 4 rf. Also take daily Vitamin D over the counter. If already taking a daily supplement increase by 1000 IU daily and if not start Vitamin D 2000 IU daily.  

## 2016-04-08 NOTE — Assessment & Plan Note (Signed)
Well controlled, no changes to meds. Encouraged heart healthy diet such as the DASH diet and exercise as tolerated.  °

## 2016-04-08 NOTE — Assessment & Plan Note (Signed)
On Levothyroxine, continue to monitor 

## 2016-04-08 NOTE — Assessment & Plan Note (Addendum)
Stiff and sore especially after prolonged immobility. Encouraged moist heat and gentle stretching as tolerated. May try NSAIDs and prescription meds as directed and report if symptoms worsen or seek immediate care. Stay active. Try topical treatments. Check labs

## 2016-05-07 ENCOUNTER — Other Ambulatory Visit: Payer: Self-pay | Admitting: Family Medicine

## 2016-05-07 DIAGNOSIS — E0500 Thyrotoxicosis with diffuse goiter without thyrotoxic crisis or storm: Secondary | ICD-10-CM

## 2016-05-07 DIAGNOSIS — I10 Essential (primary) hypertension: Secondary | ICD-10-CM

## 2016-05-07 DIAGNOSIS — E559 Vitamin D deficiency, unspecified: Secondary | ICD-10-CM

## 2016-05-07 DIAGNOSIS — E785 Hyperlipidemia, unspecified: Secondary | ICD-10-CM

## 2016-05-07 DIAGNOSIS — E039 Hypothyroidism, unspecified: Secondary | ICD-10-CM

## 2016-07-01 ENCOUNTER — Encounter: Payer: Self-pay | Admitting: Student

## 2016-08-05 NOTE — Progress Notes (Signed)
HPI: FU cerebrovascular disease. Echocardiogram in 2004 showed normal LV function. ABIs 6/16 normal. Carotid Dopplers October 2016 showed a 50-69% left stenosis. Nuclear study January 2017 showed ejection fraction 58% and normal perfusion. Since last seen, the patient denies any dyspnea on exertion, orthopnea, PND, pedal edema, palpitations, syncope or chest pain.   Current Outpatient Prescriptions  Medication Sig Dispense Refill  . aspirin 81 MG tablet Take 81 mg by mouth daily.      . ergocalciferol (VITAMIN D2) 50000 units capsule Take 1 capsule (50,000 Units total) by mouth once a week. 4 capsule 4  . losartan (COZAAR) 50 MG tablet Take 1 tablet by mouth daily.    Marland Kitchen SYNTHROID 75 MCG tablet Take 1 tablet (75 mcg total) by mouth daily. 90 tablet 1   No current facility-administered medications for this visit.      Past Medical History:  Diagnosis Date  . Arthritis   . Bursitis of hip 08/02/15  . Carotid disease, bilateral ([place])    [place] 10/15: R 40-59%, L 50-69%, stable for years  . Elevated sed rate    remote history of myelosuppressive disorder  . Grave's disease    s/p I-131 ablation 1990s  . Hyperlipidemia   . Hypertension   . HYPOTHYROIDISM   . Left knee pain 03/26/2014  . Personal history of colonic adenoma 11/30/2002  . Preventative health care 09/04/2015  . Vitamin D deficiency     Past Surgical History:  Procedure Laterality Date  . Jaw surgery  2001  . OOPHORECTOMY Bilateral   . VAGINAL HYSTERECTOMY  2009   vaginal hysterectomy    Social History   Social History  . Marital status: Married    Spouse name: N/A  . Number of children: N/A  . Years of education: N/A   Occupational History  .      [employer]   Social History Main Topics  . Smoking status: Former Smoker    Packs/day: 0.50    Years: 12.00    Quit date: 02/06/1993  . Smokeless tobacco: Never Used  . Alcohol use No  . Drug use: No  . Sexual activity: Yes   Other Topics  Concern  . Not on file   Social History Narrative   pharmacy tech III    Family History  Problem Relation Age of Onset  . Hypertension Mother   . Arthritis Mother   . Hyperlipidemia Mother   . Stroke Mother     1st age 19, then recurrent 50  . Atrial fibrillation Mother   . Epilepsy Mother   . Arthritis Father   . Breast cancer Sister   . Cancer Sister     Recurrent Breast CA  . Atrial fibrillation Brother   . Breast cancer Other   . Drug abuse Maternal Aunt   . Diabetes Neg Hx   . Heart attack Neg Hx     ROS: no fevers or chills, productive cough, hemoptysis, dysphasia, odynophagia, melena, hematochezia, dysuria, hematuria, rash, seizure activity, orthopnea, PND, pedal edema, claudication. Remaining systems are negative.  Physical Exam: Well-developed well-nourished in no acute distress.  Skin is warm and dry.  HEENT is normal.  Neck is supple.  Chest is clear to auscultation with normal expansion.  Cardiovascular exam is regular rate and rhythm.  Abdominal exam nontender or distended. No masses palpated. Extremities show no edema. neuro grossly intact  ECG-Sinus bradycardia at a rate of 57. No ST changes.  A/P  1 Carotid artery disease-schedule follow-up carotid Dopplers.  2 hyperlipidemia-intolerant to statins. Last LDL 130. Continue diet. Add zetia 10 mg daily; lipids and liver 4 weeks.  3 hypertension-blood pressure controlled. Continue present medications.  Kirk Ruths, MD

## 2016-08-08 ENCOUNTER — Ambulatory Visit (HOSPITAL_BASED_OUTPATIENT_CLINIC_OR_DEPARTMENT_OTHER)
Admission: RE | Admit: 2016-08-08 | Discharge: 2016-08-08 | Disposition: A | Discharge status: Home / Self Care | Payer: BLUE CROSS/BLUE SHIELD | Source: Ambulatory Visit | Attending: Cardiology | Admitting: Cardiology

## 2016-08-08 ENCOUNTER — Encounter: Payer: Self-pay | Admitting: Cardiology

## 2016-08-08 ENCOUNTER — Ambulatory Visit (INDEPENDENT_AMBULATORY_CARE_PROVIDER_SITE_OTHER): Payer: BLUE CROSS/BLUE SHIELD | Admitting: Cardiology

## 2016-08-08 VITALS — BP 130/82 | HR 57 | Ht 65.0 in | Wt 160.0 lb

## 2016-08-08 DIAGNOSIS — E785 Hyperlipidemia, unspecified: Secondary | ICD-10-CM | POA: Diagnosis not present

## 2016-08-08 DIAGNOSIS — I10 Essential (primary) hypertension: Secondary | ICD-10-CM | POA: Diagnosis not present

## 2016-08-08 DIAGNOSIS — I679 Cerebrovascular disease, unspecified: Secondary | ICD-10-CM | POA: Diagnosis not present

## 2016-08-08 DIAGNOSIS — I6529 Occlusion and stenosis of unspecified carotid artery: Secondary | ICD-10-CM | POA: Insufficient documentation

## 2016-08-08 MED ORDER — EZETIMIBE 10 MG PO TABS: 10.0000 mg | ORAL_TABLET | Freq: Every day | ORAL | 12 refills | Status: DC

## 2016-08-08 NOTE — Patient Instructions (Signed)
Medication Instructions:   START ZETIA 10 MG ONCE DAILY  Labwork:  Your physician recommends that you return for lab work AFTER TAKING ZETIA FOR 4 WEEKS= DO NOT EAT PRIOR TO LAB WORK  Testing/Procedures:  Your physician has requested that you have a carotid duplex. This test is an ultrasound of the carotid arteries in your neck. It looks at blood flow through these arteries that supply the brain with blood. Allow one hour for this exam. There are no restrictions or special instructions.    Follow-Up:  Your physician wants you to follow-up in: [place] will receive a reminder letter in the mail two months in advance. If you don't receive a letter, please call our office to schedule the follow-up appointment.   If you need a refill on your cardiac medications before your next appointment, please call your pharmacy.

## 2016-09-05 ENCOUNTER — Other Ambulatory Visit: Payer: BLUE CROSS/BLUE SHIELD

## 2016-09-06 ENCOUNTER — Other Ambulatory Visit (INDEPENDENT_AMBULATORY_CARE_PROVIDER_SITE_OTHER): Payer: BLUE CROSS/BLUE SHIELD

## 2016-09-06 DIAGNOSIS — E785 Hyperlipidemia, unspecified: Secondary | ICD-10-CM | POA: Diagnosis not present

## 2016-09-06 LAB — HEPATIC FUNCTION PANEL
ALK PHOS: 81 U/L (ref 33–130)
ALT: 27 U/L (ref 6–29)
AST: 23 U/L (ref 10–35)
Albumin: 4.3 g/dL (ref 3.6–5.1)
BILIRUBIN INDIRECT: 0.5 mg/dL (ref 0.2–1.2)
Bilirubin, Direct: 0.1 mg/dL (ref <=0.2)
TOTAL PROTEIN: 6.7 g/dL (ref 6.1–8.1)
Total Bilirubin: 0.6 mg/dL (ref 0.2–1.2)

## 2016-09-06 LAB — LIPID PANEL
CHOLESTEROL: 177 mg/dL (ref <200)
HDL: 59 mg/dL (ref >50)
LDL Cholesterol: 96 mg/dL (ref <100)
TRIGLYCERIDES: 108 mg/dL (ref <150)
Total CHOL/HDL Ratio: 3 Ratio (ref <5.0)
VLDL: 22 mg/dL (ref <30)

## 2016-09-08 ENCOUNTER — Other Ambulatory Visit: Payer: Self-pay | Admitting: Family Medicine

## 2016-10-02 ENCOUNTER — Encounter: Payer: BLUE CROSS/BLUE SHIELD | Admitting: Family Medicine

## 2016-10-16 ENCOUNTER — Other Ambulatory Visit: Payer: Self-pay | Admitting: Family Medicine

## 2016-10-16 ENCOUNTER — Ambulatory Visit (INDEPENDENT_AMBULATORY_CARE_PROVIDER_SITE_OTHER): Payer: BLUE CROSS/BLUE SHIELD | Admitting: Family Medicine

## 2016-10-16 ENCOUNTER — Encounter: Payer: Self-pay | Admitting: Family Medicine

## 2016-10-16 VITALS — BP 120/72 | HR 71 | Temp 97.6°F | Ht 65.0 in | Wt 161.2 lb

## 2016-10-16 DIAGNOSIS — I809 Phlebitis and thrombophlebitis of unspecified site: Secondary | ICD-10-CM

## 2016-10-16 DIAGNOSIS — E782 Mixed hyperlipidemia: Secondary | ICD-10-CM

## 2016-10-16 DIAGNOSIS — E039 Hypothyroidism, unspecified: Secondary | ICD-10-CM | POA: Diagnosis not present

## 2016-10-16 DIAGNOSIS — I10 Essential (primary) hypertension: Secondary | ICD-10-CM

## 2016-10-16 DIAGNOSIS — E559 Vitamin D deficiency, unspecified: Secondary | ICD-10-CM

## 2016-10-16 DIAGNOSIS — Z0000 Encounter for general adult medical examination without abnormal findings: Secondary | ICD-10-CM

## 2016-10-16 DIAGNOSIS — R739 Hyperglycemia, unspecified: Secondary | ICD-10-CM | POA: Diagnosis not present

## 2016-10-16 HISTORY — DX: Phlebitis and thrombophlebitis of unspecified site: I80.9

## 2016-10-16 LAB — COMPREHENSIVE METABOLIC PANEL
ALT: 20 U/L (ref 0–35)
AST: 18 U/L (ref 0–37)
Albumin: 4.2 g/dL (ref 3.5–5.2)
Alkaline Phosphatase: 79 U/L (ref 39–117)
BUN: 15 mg/dL (ref 6–23)
CHLORIDE: 104 meq/L (ref 96–112)
CO2: 30 mEq/L (ref 19–32)
Calcium: 9.6 mg/dL (ref 8.4–10.5)
Creatinine, Ser: 0.69 mg/dL (ref 0.40–1.20)
GFR: 91.39 mL/min (ref >60.00)
GLUCOSE: 85 mg/dL (ref 70–99)
POTASSIUM: 4.1 meq/L (ref 3.5–5.1)
SODIUM: 140 meq/L (ref 135–145)
TOTAL PROTEIN: 7.2 g/dL (ref 6.0–8.3)
Total Bilirubin: 0.5 mg/dL (ref 0.2–1.2)

## 2016-10-16 LAB — CBC
HEMATOCRIT: 40 % (ref 36.0–46.0)
Hemoglobin: 13.3 g/dL (ref 12.0–15.0)
MCHC: 33.3 g/dL (ref 30.0–36.0)
MCV: 88.5 fl (ref 78.0–100.0)
Platelets: 225 10*3/uL (ref 150.0–400.0)
RBC: 4.52 Mil/uL (ref 3.87–5.11)
RDW: 13.5 % (ref 11.5–15.5)
WBC: 7.5 10*3/uL (ref 4.0–10.5)

## 2016-10-16 LAB — TSH: TSH: 1.89 u[IU]/mL (ref 0.35–4.50)

## 2016-10-16 LAB — LIPID PANEL
Cholesterol: 218 mg/dL — ABNORMAL HIGH (ref 0–200)
HDL: 58.9 mg/dL (ref >39.00)
LDL CALC: 133 mg/dL — AB (ref 0–99)
NONHDL: 158.78
Total CHOL/HDL Ratio: 4
Triglycerides: 128 mg/dL (ref 0.0–149.0)
VLDL: 25.6 mg/dL (ref 0.0–40.0)

## 2016-10-16 LAB — HEMOGLOBIN A1C: Hgb A1c MFr Bld: 6 % (ref 4.6–6.5)

## 2016-10-16 LAB — VITAMIN D 25 HYDROXY (VIT D DEFICIENCY, FRACTURES): VITD: 29.32 ng/mL — AB (ref 30.00–100.00)

## 2016-10-16 MED ORDER — LOSARTAN POTASSIUM 50 MG PO TABS: 50.0000 mg | ORAL_TABLET | Freq: Every day | ORAL | 2 refills | Status: DC

## 2016-10-16 MED ORDER — VITAMIN D (ERGOCALCIFEROL) 1.25 MG (50000 UNIT) PO CAPS: 50000.0000 [IU] | ORAL_CAPSULE | ORAL | Status: DC

## 2016-10-16 MED ORDER — SYNTHROID 75 MCG PO TABS: 75.0000 ug | ORAL_TABLET | Freq: Every day | ORAL | 2 refills | Status: DC

## 2016-10-16 NOTE — Patient Instructions (Signed)
NOW company [email or web address] Preventive Care 40-64 Years, Female Preventive care refers to lifestyle choices and visits with your health care provider that can promote health and wellness. What does preventive care include?  A yearly physical exam. This is also called an annual well check.  Dental exams once or twice a year.  Routine eye exams. Ask your health care provider how often you should have your eyes checked.  Personal lifestyle choices, including:  Daily care of your teeth and gums.  Regular physical activity.  Eating a healthy diet.  Avoiding tobacco and drug use.  Limiting alcohol use.  Practicing safe sex.  Taking low-dose aspirin daily starting at age 78.  Taking vitamin and mineral supplements as recommended by your health care provider. What happens during an annual well check? The services and screenings done by your health care provider during your annual well check will depend on your age, overall health, lifestyle risk factors, and family history of disease. Counseling  Your health care provider may ask you questions about your:  Alcohol use.  Tobacco use.  Drug use.  Emotional well-being.  Home and relationship well-being.  Sexual activity.  Eating habits.  Work and work Statistician.  Method of birth control.  Menstrual cycle.  Pregnancy history. Screening  You may have the following tests or measurements:  Height, weight, and BMI.  Blood pressure.  Lipid and cholesterol levels. These may be checked every 5 years, or more frequently if you are over 62 years old.  Skin check.  Lung cancer screening. You may have this screening every year starting at age 71 if you have a 30-pack-year history of smoking and currently smoke or have quit within the past 15 years.  Fecal occult blood test (FOBT) of the stool. You may have this test every year starting at age 58.  Flexible sigmoidoscopy or colonoscopy. You may have a  sigmoidoscopy every 5 years or a colonoscopy every 10 years starting at age 32.  Hepatitis C blood test.  Hepatitis B blood test.  Sexually transmitted disease (STD) testing.  Diabetes screening. This is done by checking your blood sugar (glucose) after you have not eaten for a while (fasting). You may have this done every 1-3 years.  Mammogram. This may be done every 1-2 years. Talk to your health care provider about when you should start having regular mammograms. This may depend on whether you have a family history of breast cancer.  BRCA-related cancer screening. This may be done if you have a family history of breast, ovarian, tubal, or peritoneal cancers.  Pelvic exam and Pap test. This may be done every 3 years starting at age 72. Starting at age 58, this may be done every 5 years if you have a Pap test in combination with an HPV test.  Bone density scan. This is done to screen for osteoporosis. You may have this scan if you are at high risk for osteoporosis. Discuss your test results, treatment options, and if necessary, the need for more tests with your health care provider. Vaccines  Your health care provider may recommend certain vaccines, such as:  Influenza vaccine. This is recommended every year.  Tetanus, diphtheria, and acellular pertussis (Tdap, Td) vaccine. You may need a Td booster every 10 years.  Varicella vaccine. You may need this if you have not been vaccinated.  Zoster vaccine. You may need this after age 70.  Measles, mumps, and rubella (MMR) vaccine. You may need at least one dose of MMR  if you were born in 1957 or later. You may also need a second dose.  Pneumococcal 13-valent conjugate (PCV13) vaccine. You may need this if you have certain conditions and were not previously vaccinated.  Pneumococcal polysaccharide (PPSV23) vaccine. You may need one or two doses if you smoke cigarettes or if you have certain conditions.  Meningococcal vaccine. You may  need this if you have certain conditions.  Hepatitis A vaccine. You may need this if you have certain conditions or if you travel or work in places where you may be exposed to hepatitis A.  Hepatitis B vaccine. You may need this if you have certain conditions or if you travel or work in places where you may be exposed to hepatitis B.  Haemophilus influenzae type b (Hib) vaccine. You may need this if you have certain conditions. Talk to your health care provider about which screenings and vaccines you need and how often you need them. This information is not intended to replace advice given to you by your health care provider. Make sure you discuss any questions you have with your health care provider. Document Released: 10/14/2015 Document Revised: 06/06/2016 Document Reviewed: 07/19/2015 Elsevier Interactive Patient Education  2017 [employer].

## 2016-10-16 NOTE — Assessment & Plan Note (Addendum)
Patient encouraged to maintain heart healthy diet, regular exercise, adequate sleep. Consider daily probiotics. Take medications as prescribed. Given and reviewed copy of ACP documents from [place] Secretary of State and encouraged to complete and return 

## 2016-10-16 NOTE — Progress Notes (Signed)
Pre visit review using our clinic review tool, if applicable. No additional management support is needed unless otherwise documented below in the visit note. 

## 2016-10-16 NOTE — Assessment & Plan Note (Signed)
On Levothyroxine, continue to monitor 

## 2016-10-16 NOTE — Assessment & Plan Note (Signed)
Well controlled, no changes to meds. Encouraged heart healthy diet such as the DASH diet and exercise as tolerated.  °

## 2016-10-16 NOTE — Assessment & Plan Note (Addendum)
Hot compresses, thigh hi stockings and referred to vascular surgery. Flares and becomes painful intermittently in posterior right thigh

## 2016-10-16 NOTE — Assessment & Plan Note (Addendum)
Check level today, has stopped her hi dose tab. No daily tab. Stopped several weeks ago

## 2016-10-16 NOTE — Assessment & Plan Note (Addendum)
Encouraged heart healthy diet, increase exercise, avoid trans fats, consider a krill oil cap daily. Not on any meds at this time and her myalgias have resolved. Start a krill oil daily. Recheck panel in 3 months

## 2016-10-16 NOTE — Assessment & Plan Note (Signed)
minimize simple carbs. Increase exercise as tolerated.  

## 2016-10-16 NOTE — Progress Notes (Signed)
Patient ID: Lindsay Hodges, female   DOB: 12-Mar-1956, 61 y.o.   MRN: [id number]   Subjective:    Patient ID: Lindsay Hodges, female    DOB: 1955/12/17, 61 y.o.   MRN: [id number]  Chief Complaint  Patient presents with  . Annual Exam    HPI Patient is in today for annual preventative exam and follow up on medical concerns.  She is struggling with varicose veins the most symptomatic is on right posterior thigh. It is not flared or painful today but it occurs regularly and gets warm and swollen, she has not tried any particular treatmentt. No other recillness or acute concerns. Denies CP/palp/SOB/HA/congestion/fevers/GI or GU c/o. Taking meds as prescribed  Past Medical History:  Diagnosis Date  . Arthritis   . Bursitis of hip 08/02/15  . Carotid disease, bilateral ([place])    [place] 10/15: R 40-59%, L 50-69%, stable for years  . Elevated sed rate    remote history of myelosuppressive disorder  . Grave's disease    s/p I-131 ablation 1990s  . Hyperlipidemia   . Hypertension   . HYPOTHYROIDISM   . Left knee pain 03/26/2014  . Personal history of colonic adenoma 11/30/2002  . Preventative health care 09/04/2015  . Thrombophlebitis 10/16/2016  . Vitamin D deficiency     Past Surgical History:  Procedure Laterality Date  . Jaw surgery  2001  . OOPHORECTOMY Bilateral   . VAGINAL HYSTERECTOMY  2009   vaginal hysterectomy    Family History  Problem Relation Age of Onset  . Hypertension Mother   . Arthritis Mother   . Hyperlipidemia Mother   . Stroke Mother     1st age 82, then recurrent 83  . Atrial fibrillation Mother   . Epilepsy Mother   . Arthritis Father   . Breast cancer Sister   . Cancer Sister     Recurrent Breast CA  . Atrial fibrillation Brother   . Breast cancer Other   . Drug abuse Maternal Aunt   . Diabetes Neg Hx   . Heart attack Neg Hx     Social History   Social History  . Marital status: Married    Spouse name: N/A  . Number of children: N/A  . Years of  education: N/A   Occupational History  .      [employer]   Social History Main Topics  . Smoking status: Former Smoker    Packs/day: 0.50    Years: 12.00    Quit date: 02/06/1993  . Smokeless tobacco: Never Used  . Alcohol use No  . Drug use: No  . Sexual activity: Yes   Other Topics Concern  . Not on file   Social History Narrative   pharmacy tech III    Outpatient Medications Prior to Visit  Medication Sig Dispense Refill  . aspirin 81 MG tablet Take 81 mg by mouth daily.      Marland Kitchen ezetimibe (ZETIA) 10 MG tablet Take 1 tablet (10 mg total) by mouth daily. 30 tablet 12  . losartan (COZAAR) 50 MG tablet Take 1 tablet by mouth daily.    Marland Kitchen SYNTHROID 75 MCG tablet Take 1 tablet (75 mcg total) by mouth daily. 90 tablet 1  . Vitamin D, Ergocalciferol, (DRISDOL) 50000 units CAPS capsule TAKE 1 CAPSULE (50,000 UNITS TOTAL) BY MOUTH ONCE A WEEK. 4 capsule 1   No facility-administered medications prior to visit.     Allergies  Allergen Reactions  . Penicillins  REACTION: swelling/dyspnea    Review of Systems  Constitutional: Negative for chills, fever and malaise/fatigue.  HENT: Negative for congestion and hearing loss.   Eyes: Negative for discharge.  Respiratory: Negative for cough, sputum production and shortness of breath.   Cardiovascular: Negative for chest pain, palpitations and leg swelling.  Gastrointestinal: Negative for abdominal pain, blood in stool, constipation, diarrhea, heartburn, nausea and vomiting.  Genitourinary: Negative for dysuria, frequency, hematuria and urgency.  Musculoskeletal: Negative for back pain, falls and myalgias.  Skin: Negative for rash.  Neurological: Negative for dizziness, sensory change, loss of consciousness, weakness and headaches.  Endo/Heme/Allergies: Negative for environmental allergies. Does not bruise/bleed easily.  Psychiatric/Behavioral: Negative for depression and suicidal ideas. The patient is not nervous/anxious  and does not have insomnia.        Objective:    Physical Exam  Constitutional: She is oriented to person, place, and time. She appears well-developed and well-nourished. No distress.  HENT:  Head: Normocephalic and atraumatic.  Eyes: Conjunctivae are normal.  Neck: Neck supple. No thyromegaly present.  Cardiovascular: Normal rate, regular rhythm and normal heart sounds.   No murmur heard. Pulmonary/Chest: Effort normal and breath sounds normal. No respiratory distress.  Abdominal: Soft. Bowel sounds are normal. She exhibits no distension and no mass. There is no tenderness.  Musculoskeletal: She exhibits no edema.  Lymphadenopathy:    She has no cervical adenopathy.  Neurological: She is alert and oriented to person, place, and time.  Skin: Skin is warm and dry. No rash noted.  Psychiatric: She has a normal mood and affect. Her behavior is normal.    BP 120/72 (BP Location: Left Arm, Patient Position: Sitting, Cuff Size: Normal)   Pulse 71   Temp 97.6 F (36.4 C) (Oral)   Ht 5\' 5"  (1.651 m)   Wt 161 lb 4 oz (73.1 kg)   SpO2 97%   BMI 26.83 kg/m  Wt Readings from Last 3 Encounters:  10/16/16 161 lb 4 oz (73.1 kg)  08/08/16 160 lb (72.6 kg)  04/02/16 160 lb 2 oz (72.6 kg)     Lab Results  Component Value Date   WBC 7.5 10/16/2016   HGB 13.3 10/16/2016   HCT 40.0 10/16/2016   PLT 225.0 10/16/2016   GLUCOSE 85 10/16/2016   CHOL 218 (H) 10/16/2016   TRIG 128.0 10/16/2016   HDL 58.90 10/16/2016   LDLCALC 133 (H) 10/16/2016   ALT 20 10/16/2016   AST 18 10/16/2016   NA 140 10/16/2016   K 4.1 10/16/2016   CL 104 10/16/2016   CREATININE 0.69 10/16/2016   BUN 15 10/16/2016   CO2 30 10/16/2016   TSH 1.89 10/16/2016   HGBA1C 6.0 10/16/2016    Lab Results  Component Value Date   TSH 1.89 10/16/2016   Lab Results  Component Value Date   WBC 7.5 10/16/2016   HGB 13.3 10/16/2016   HCT 40.0 10/16/2016   MCV 88.5 10/16/2016   PLT 225.0 10/16/2016   Lab Results   Component Value Date   NA 140 10/16/2016   K 4.1 10/16/2016   CO2 30 10/16/2016   GLUCOSE 85 10/16/2016   BUN 15 10/16/2016   CREATININE 0.69 10/16/2016   BILITOT 0.5 10/16/2016   ALKPHOS 79 10/16/2016   AST 18 10/16/2016   ALT 20 10/16/2016   PROT 7.2 10/16/2016   ALBUMIN 4.2 10/16/2016   CALCIUM 9.6 10/16/2016   GFR 91.39 10/16/2016   Lab Results  Component Value Date   CHOL  218 (H) 10/16/2016   Lab Results  Component Value Date   HDL 58.90 10/16/2016   Lab Results  Component Value Date   LDLCALC 133 (H) 10/16/2016   Lab Results  Component Value Date   TRIG 128.0 10/16/2016   Lab Results  Component Value Date   CHOLHDL 4 10/16/2016   Lab Results  Component Value Date   HGBA1C 6.0 10/16/2016       Assessment & Plan:   Problem List Items Addressed This Visit    Hypothyroidism    .On Levothyroxine, continue to monitor      Relevant Medications   SYNTHROID 75 MCG tablet   Hyperlipidemia    Encouraged heart healthy diet, increase exercise, avoid trans fats, consider a krill oil cap daily. Not on any meds at this time and her myalgias have resolved. Start a krill oil daily. Recheck panel in 3 months      Relevant Medications   losartan (COZAAR) 50 MG tablet   Other Relevant Orders   Lipid panel (Completed)   Hypertension    Well controlled, no changes to meds. Encouraged heart healthy diet such as the DASH diet and exercise as tolerated.       Relevant Medications   losartan (COZAAR) 50 MG tablet   Other Relevant Orders   CBC (Completed)   Comprehensive metabolic panel (Completed)   TSH (Completed)   Vitamin D deficiency    Check level today, has stopped her hi dose tab. No daily tab. Stopped several weeks ago      Relevant Orders   VITAMIN D 25 Hydroxy (Vit-D Deficiency, Fractures) (Completed)   Preventative health care    Patient encouraged to maintain heart healthy diet, regular exercise, adequate sleep. Consider daily probiotics. Take  medications as prescribed. Given and reviewed copy of ACP documents from [place] and encouraged to complete and return      Hyperglycemia - Primary     minimize simple carbs. Increase exercise as tolerated.       Relevant Orders   Hemoglobin A1c (Completed)   Thrombophlebitis    Hot compresses, thigh hi stockings and referred to vascular surgery. Flares and becomes painful intermittently in posterior right thigh      Relevant Medications   losartan (COZAAR) 50 MG tablet   Other Relevant Orders   Ambulatory referral to Vascular Surgery      I have changed Ms. Nathaniel's losartan. I am also having her maintain her aspirin, ezetimibe, and SYNTHROID.  Meds ordered this encounter  Medications  . SYNTHROID 75 MCG tablet    Sig: Take 1 tablet (75 mcg total) by mouth daily.    Dispense:  90 tablet    Refill:  2  . losartan (COZAAR) 50 MG tablet    Sig: Take 1 tablet (50 mg total) by mouth daily.    Dispense:  90 tablet    Refill:  2      Penni Homans, MD

## 2016-10-18 ENCOUNTER — Other Ambulatory Visit: Payer: Self-pay | Admitting: *Deleted

## 2016-10-18 DIAGNOSIS — I8003 Phlebitis and thrombophlebitis of superficial vessels of lower extremities, bilateral: Secondary | ICD-10-CM

## 2016-11-23 ENCOUNTER — Encounter: Payer: Self-pay | Admitting: Vascular Surgery

## 2016-11-30 ENCOUNTER — Encounter: Payer: Self-pay | Admitting: Vascular Surgery

## 2016-11-30 ENCOUNTER — Ambulatory Visit (HOSPITAL_COMMUNITY)
Admission: RE | Admit: 2016-11-30 | Discharge: 2016-11-30 | Disposition: A | Discharge status: Home / Self Care | Payer: BLUE CROSS/BLUE SHIELD | Source: Ambulatory Visit | Attending: Vascular Surgery | Admitting: Vascular Surgery

## 2016-11-30 ENCOUNTER — Ambulatory Visit (INDEPENDENT_AMBULATORY_CARE_PROVIDER_SITE_OTHER): Payer: BLUE CROSS/BLUE SHIELD | Admitting: Vascular Surgery

## 2016-11-30 VITALS — BP 149/71 | HR 72 | Temp 97.6°F | Resp 16 | Ht 65.0 in | Wt 160.0 lb

## 2016-11-30 DIAGNOSIS — I8393 Asymptomatic varicose veins of bilateral lower extremities: Secondary | ICD-10-CM

## 2016-11-30 DIAGNOSIS — I8003 Phlebitis and thrombophlebitis of superficial vessels of lower extremities, bilateral: Secondary | ICD-10-CM

## 2016-11-30 NOTE — Progress Notes (Signed)
Patient ID: Lindsay Hodges, female   DOB: [birth year], 61 y.o.   MRN: [id number]  Reason for Consult: New Evaluation (R LE Superficial thrombophlebitis.  Epic referral from Dr. Willette Alma. 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 She had an episode of bleeding of her right posterior thigh varicosity that caused hematoma but has resolved. She does not have any history of DVT does not have any other bleeding disorders. She is concerned about the appearance of the spider veins not so much about the varicosities although she does not want to have another episode of bleeding from these. She is generally healthy all her taking Synthroid having previously had radioablation of her thyroid. She does not have significant swelling has never had ulceration of her bilateral lower extremity is and does not have skin changes either.  Past Medical History:  Diagnosis Date  . Arthritis   . Bursitis of hip 08/02/15  . Carotid disease, bilateral ([place])    [place] 10/15: R 40-59%, L 50-69%, stable for years  . Elevated sed rate    remote history of myelosuppressive disorder  . Grave's disease    s/p I-131 ablation 1990s  . Hyperlipidemia   . Hypertension   . HYPOTHYROIDISM   . Left knee pain 03/26/2014  . Personal history of colonic adenoma 11/30/2002  . Preventative health care 09/04/2015  . Thrombophlebitis 10/16/2016  . Vitamin D deficiency    Family History  Problem Relation Age of Onset  . Hypertension Mother   . Arthritis Mother   . Hyperlipidemia Mother   . Stroke Mother     1st age 40, then recurrent 18  . Atrial fibrillation Mother   . Epilepsy Mother   . Arthritis Father   . Breast cancer Sister   . Cancer Sister     Recurrent Breast CA  . Atrial fibrillation Brother   . Breast cancer Other   . Drug abuse  Maternal Aunt   . Diabetes Neg Hx   . Heart attack Neg Hx    Past Surgical History:  Procedure Laterality Date  . Jaw surgery  2001  . OOPHORECTOMY Bilateral   . VAGINAL HYSTERECTOMY  2009   vaginal hysterectomy    Short Social History:  Social History  Substance Use Topics  . Smoking status: Former Smoker    Packs/day: 0.50    Years: 12.00    Quit date: 02/06/1993  . Smokeless tobacco: Never Used  . Alcohol use No    Allergies  Allergen Reactions  . Penicillins     REACTION: swelling/dyspnea    Current Outpatient Prescriptions  Medication Sig Dispense Refill  . aspirin 81 MG tablet Take 81 mg by mouth daily.      Marland Kitchen losartan (COZAAR) 50 MG tablet Take 1 tablet (50 mg total) by mouth daily. 90 tablet 2  . SYNTHROID 75 MCG tablet Take 1 tablet (75 mcg total) by mouth daily. 90 tablet 2  . Vitamin D, Ergocalciferol, (DRISDOL) 50000 units CAPS capsule Take 1 capsule (50,000 Units total) by mouth once a week. 12 capsule o   No current facility-administered medications for this visit.     Review of Systems  Constitutional:  Constitutional negative. HENT: HENT negative.  Eyes: Eyes negative.  Respiratory: Respiratory negative.  Cardiovascular: Cardiovascular negative.  GI:  Gastrointestinal negative.  Musculoskeletal: Musculoskeletal negative.  Skin:       Varicose veins and spider veins Neurological: Neurological negative. Hematologic: Hematologic/lymphatic negative.  Psychiatric: Psychiatric negative.        Objective:  Objective   Vitals:   11/30/16 1433  BP: (!) 149/71  Pulse: 72  Resp: 16  Temp: 97.6 F (36.4 C)  TempSrc: Oral  SpO2: 97%  Weight: 160 lb (72.6 kg)  Height: 5\' 5"  (1.651 m)   Body mass index is 26.63 kg/m.  Physical Exam  Constitutional: She is oriented to person, place, and time. She appears well-developed.  HENT:  Head: Normocephalic.  Eyes: Pupils are equal, round, and reactive to light.  Neck: Normal range of motion.    Cardiovascular: Normal rate.   Pulses:      Radial pulses are 2+ on the right side, and 2+ on the left side.       Popliteal pulses are 2+ on the right side, and 2+ on the left side.       Dorsalis pedis pulses are 2+ on the right side, and 2+ on the left side.       Posterior tibial pulses are 2+ on the right side, and 2+ on the left side.  Pulmonary/Chest: Effort normal.  Abdominal: Soft.  Musculoskeletal: Normal range of motion. She exhibits no edema.  Neurological: She is alert and oriented to person, place, and time.  Skin:  Bilateral spider veins mostly on thighs Varicosities noted R posterior thigh and anterior left leg  Psychiatric: She has a normal mood and affect. Her behavior is normal. Judgment and thought content normal.    Data: Bilateral greater saphenous vein reflux.     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 This C2 disease.  She has been given the number to contact for spider vein treatment we will also order thigh-high compression stockings today. She will follow up in 3 months in the vein center to discuss possible greater saphenous vein ablation and stab phlebectomy of her varicose veins. She demonstrates very good understanding.     Waynetta Sandy MD [facility]

## 2017-01-03 ENCOUNTER — Other Ambulatory Visit: Payer: Self-pay | Admitting: Family Medicine

## 2017-01-16 ENCOUNTER — Encounter: Payer: Self-pay | Admitting: *Deleted

## 2017-01-23 ENCOUNTER — Ambulatory Visit (INDEPENDENT_AMBULATORY_CARE_PROVIDER_SITE_OTHER): Payer: Self-pay | Admitting: *Deleted

## 2017-01-23 DIAGNOSIS — I8393 Asymptomatic varicose veins of bilateral lower extremities: Secondary | ICD-10-CM

## 2017-01-23 NOTE — Progress Notes (Signed)
X=.3% Sotradecol administered with a 27g butterfly.  Patient received a total of 6cc.  Treated majority of her concerns with one syringe. She has reflux but small diameters, so we chose to just try one syringe. Easy access. Tol well. Will follow prn.   Compression stockings applied: Yes.

## 2017-01-24 ENCOUNTER — Encounter: Payer: Self-pay | Admitting: *Deleted

## 2017-01-30 ENCOUNTER — Ambulatory Visit: Payer: BLUE CROSS/BLUE SHIELD | Admitting: *Deleted

## 2017-03-01 ENCOUNTER — Ambulatory Visit (INDEPENDENT_AMBULATORY_CARE_PROVIDER_SITE_OTHER): Payer: BLUE CROSS/BLUE SHIELD | Admitting: Family Medicine

## 2017-03-01 ENCOUNTER — Other Ambulatory Visit (HOSPITAL_COMMUNITY)
Admission: RE | Admit: 2017-03-01 | Discharge: 2017-03-01 | Disposition: A | Discharge status: Home / Self Care | Payer: BLUE CROSS/BLUE SHIELD | Source: Ambulatory Visit | Attending: Family Medicine | Admitting: Family Medicine

## 2017-03-01 ENCOUNTER — Encounter: Payer: Self-pay | Admitting: Family Medicine

## 2017-03-01 VITALS — BP 128/68 | HR 65 | Temp 98.1°F | Resp 18 | Wt 157.8 lb

## 2017-03-01 DIAGNOSIS — M1990 Unspecified osteoarthritis, unspecified site: Secondary | ICD-10-CM

## 2017-03-01 DIAGNOSIS — I809 Phlebitis and thrombophlebitis of unspecified site: Secondary | ICD-10-CM

## 2017-03-01 DIAGNOSIS — Z124 Encounter for screening for malignant neoplasm of cervix: Secondary | ICD-10-CM | POA: Insufficient documentation

## 2017-03-01 DIAGNOSIS — R87620 Atypical squamous cells of undetermined significance on cytologic smear of vagina (ASC-US): Secondary | ICD-10-CM | POA: Insufficient documentation

## 2017-03-01 DIAGNOSIS — E039 Hypothyroidism, unspecified: Secondary | ICD-10-CM | POA: Diagnosis not present

## 2017-03-01 DIAGNOSIS — E782 Mixed hyperlipidemia: Secondary | ICD-10-CM | POA: Diagnosis not present

## 2017-03-01 DIAGNOSIS — I10 Essential (primary) hypertension: Secondary | ICD-10-CM

## 2017-03-01 DIAGNOSIS — R739 Hyperglycemia, unspecified: Secondary | ICD-10-CM | POA: Diagnosis not present

## 2017-03-01 DIAGNOSIS — E559 Vitamin D deficiency, unspecified: Secondary | ICD-10-CM

## 2017-03-01 LAB — COMPREHENSIVE METABOLIC PANEL
ALBUMIN: 4.3 g/dL (ref 3.5–5.2)
ALK PHOS: 86 U/L (ref 39–117)
ALT: 81 U/L — ABNORMAL HIGH (ref 0–35)
AST: 45 U/L — AB (ref 0–37)
BILIRUBIN TOTAL: 0.5 mg/dL (ref 0.2–1.2)
BUN: 16 mg/dL (ref 6–23)
CO2: 31 mEq/L (ref 19–32)
CREATININE: 0.75 mg/dL (ref 0.40–1.20)
Calcium: 9.7 mg/dL (ref 8.4–10.5)
Chloride: 104 mEq/L (ref 96–112)
GFR: 82.91 mL/min (ref >60.00)
Glucose, Bld: 83 mg/dL (ref 70–99)
Potassium: 4.1 mEq/L (ref 3.5–5.1)
SODIUM: 140 meq/L (ref 135–145)
TOTAL PROTEIN: 7.3 g/dL (ref 6.0–8.3)

## 2017-03-01 LAB — LIPID PANEL
CHOLESTEROL: 210 mg/dL — AB (ref 0–200)
HDL: 52.6 mg/dL (ref >39.00)
LDL Cholesterol: 134 mg/dL — ABNORMAL HIGH (ref 0–99)
NonHDL: 157
Total CHOL/HDL Ratio: 4
Triglycerides: 114 mg/dL (ref 0.0–149.0)
VLDL: 22.8 mg/dL (ref 0.0–40.0)

## 2017-03-01 LAB — CBC
HCT: 41.2 % (ref 36.0–46.0)
Hemoglobin: 13.5 g/dL (ref 12.0–15.0)
MCHC: 32.9 g/dL (ref 30.0–36.0)
MCV: 90.3 fl (ref 78.0–100.0)
Platelets: 200 10*3/uL (ref 150.0–400.0)
RBC: 4.56 Mil/uL (ref 3.87–5.11)
RDW: 13.6 % (ref 11.5–15.5)
WBC: 5.5 10*3/uL (ref 4.0–10.5)

## 2017-03-01 LAB — TSH: TSH: 1.37 u[IU]/mL (ref 0.35–4.50)

## 2017-03-01 LAB — VITAMIN D 25 HYDROXY (VIT D DEFICIENCY, FRACTURES): VITD: 28.5 ng/mL — AB (ref 30.00–100.00)

## 2017-03-01 LAB — HEMOGLOBIN A1C: Hgb A1c MFr Bld: 6.1 % (ref 4.6–6.5)

## 2017-03-01 NOTE — Assessment & Plan Note (Signed)
Encouraged heart healthy diet, increase exercise, avoid trans fats, consider a krill oil cap daily. Does does not tolerate the statins

## 2017-03-01 NOTE — Assessment & Plan Note (Signed)
Was seen by vascular surgery and had some veins ablated with good results

## 2017-03-01 NOTE — Progress Notes (Signed)
Subjective:  I acted as a Education administrator for Dr. Charlett Blake. [place], [place]  Patient ID: Lindsay Hodges, female    DOB: 01-06-1956, 61 y.o.   MRN: [id number]  Chief Complaint  Patient presents with  . Follow-up    HPI  Patient is in today for 4 month follow with a pap smear. Patient has no acute concerns. She is interested in having her pap done today. Her paps were abnormal in past. No recent febrile illness or acute concerns. No hospitalizations. She had some varicose veins ablated with vascular and has  Handled it well. She has retired from [employer]. Denies CP/palp/SOB/HA/congestion/fevers/GI or GU c/o. Taking meds as prescribed  Patient Care Team: Mosie Lukes, MD as PCP - General (Family Medicine) Lelon Perla, MD (Cardiology) Gatha Mayer, MD (Gastroenterology) Camillo Flaming, OD as Referring Physician (Optometry)   Past Medical History:  Diagnosis Date  . Arthritis   . Bursitis of hip 08/02/15  . Carotid disease, bilateral ([place])    [place] 10/15: R 40-59%, L 50-69%, stable for years  . Elevated sed rate    remote history of myelosuppressive disorder  . Grave's disease    s/p I-131 ablation 1990s  . Hyperlipidemia   . Hypertension   . HYPOTHYROIDISM   . Left knee pain 03/26/2014  . Personal history of colonic adenoma 11/30/2002  . Preventative health care 09/04/2015  . Thrombophlebitis 10/16/2016  . Vitamin D deficiency     Past Surgical History:  Procedure Laterality Date  . Jaw surgery  2001  . OOPHORECTOMY Bilateral   . VAGINAL HYSTERECTOMY  2009   vaginal hysterectomy    Family History  Problem Relation Age of Onset  . Hypertension Mother   . Arthritis Mother   . Hyperlipidemia Mother   . Stroke Mother        1st age 47, then recurrent 64  . Atrial fibrillation Mother   . Epilepsy Mother   . Arthritis Father   . Breast cancer Sister   . Cancer Sister        Recurrent Breast CA  . Atrial fibrillation Brother   . Von Willebrand disease Daughter   . Other Daughter         POTS  . Factor V Leiden deficiency Daughter   . Breast cancer Other   . Drug abuse Maternal Aunt   . Diabetes Neg Hx   . Heart attack Neg Hx     Social History   Social History  . Marital status: Married    Spouse name: N/A  . Number of children: N/A  . Years of education: N/A   Occupational History  .      [employer]   Social History Main Topics  . Smoking status: Former Smoker    Packs/day: 0.50    Years: 12.00    Quit date: 02/06/1993  . Smokeless tobacco: Never Used  . Alcohol use No  . Drug use: No  . Sexual activity: Yes   Other Topics Concern  . Not on file   Social History Narrative   pharmacy tech III    Outpatient Medications Prior to Visit  Medication Sig Dispense Refill  . aspirin 81 MG tablet Take 81 mg by mouth daily.      Marland Kitchen losartan (COZAAR) 50 MG tablet Take 1 tablet (50 mg total) by mouth daily. 90 tablet 2  . SYNTHROID 75 MCG tablet Take 1 tablet (75 mcg total) by mouth daily. 90 tablet 2  .  Vitamin D, Ergocalciferol, (DRISDOL) 50000 units CAPS capsule Take 1 capsule (50,000 Units total) by mouth once a week. 12 capsule o  . Vitamin D, Ergocalciferol, (DRISDOL) 50000 units CAPS capsule TAKE 1 CAPSULE (50,000 UNITS TOTAL) BY MOUTH ONCE A WEEK. 4 capsule 1   No facility-administered medications prior to visit.     Allergies  Allergen Reactions  . Penicillins     REACTION: swelling/dyspnea  . Statins Other (See Comments)    Myalgias on several statins    Review of Systems  Constitutional: Negative for fever and malaise/fatigue.  HENT: Negative for congestion.   Eyes: Negative for blurred vision.  Respiratory: Negative for cough and shortness of breath.   Cardiovascular: Negative for chest pain, palpitations and leg swelling.  Gastrointestinal: Negative for vomiting.  Musculoskeletal: Positive for joint pain. Negative for back pain.  Skin: Negative for rash.  Neurological: Negative for loss of consciousness and headaches.         Objective:    Physical Exam  Constitutional: She is oriented to person, place, and time. She appears well-developed and well-nourished. No distress.  HENT:  Head: Normocephalic and atraumatic.  Eyes: Conjunctivae are normal.  Neck: Normal range of motion. No thyromegaly present.  Cardiovascular: Normal rate and regular rhythm.   Pulmonary/Chest: Effort normal and breath sounds normal. She has no wheezes.  Abdominal: Soft. Bowel sounds are normal. There is no tenderness.  Musculoskeletal: Normal range of motion. She exhibits no edema or deformity.  Neurological: She is alert and oriented to person, place, and time.  Skin: Skin is warm and dry. She is not diaphoretic.  Psychiatric: She has a normal mood and affect.    BP 128/68 (BP Location: Left Arm, Patient Position: Sitting, Cuff Size: Normal)   Pulse 65   Temp 98.1 F (36.7 C) (Oral)   Resp 18   Wt 157 lb 12.8 oz (71.6 kg)   SpO2 97%   BMI 26.26 kg/m  Wt Readings from Last 3 Encounters:  03/01/17 157 lb 12.8 oz (71.6 kg)  11/30/16 160 lb (72.6 kg)  10/16/16 161 lb 4 oz (73.1 kg)   BP Readings from Last 3 Encounters:  03/01/17 128/68  11/30/16 (!) 149/71  10/16/16 120/72     Immunization History  Administered Date(s) Administered  . Influenza Split 07/01/2012, 07/01/2013  . Influenza-Unspecified 06/15/2014, 06/02/2015  . Tdap 10/02/2011  . Zoster 08/23/2014    Health Maintenance  Topic Date Due  . HIV Screening  12/29/1968  . PAP SMEAR  02/07/2016  . INFLUENZA VACCINE  05/01/2017  . MAMMOGRAM  12/26/2017  . COLONOSCOPY  07/09/2018  . TETANUS/TDAP  10/01/2021  . Hepatitis C Screening  Completed    Lab Results  Component Value Date   WBC 7.5 10/16/2016   HGB 13.3 10/16/2016   HCT 40.0 10/16/2016   PLT 225.0 10/16/2016   GLUCOSE 85 10/16/2016   CHOL 218 (H) 10/16/2016   TRIG 128.0 10/16/2016   HDL 58.90 10/16/2016   LDLCALC 133 (H) 10/16/2016   ALT 20 10/16/2016   AST 18 10/16/2016   NA 140  10/16/2016   K 4.1 10/16/2016   CL 104 10/16/2016   CREATININE 0.69 10/16/2016   BUN 15 10/16/2016   CO2 30 10/16/2016   TSH 1.89 10/16/2016   HGBA1C 6.0 10/16/2016    Lab Results  Component Value Date   TSH 1.89 10/16/2016   Lab Results  Component Value Date   WBC 7.5 10/16/2016   HGB 13.3 10/16/2016  HCT 40.0 10/16/2016   MCV 88.5 10/16/2016   PLT 225.0 10/16/2016   Lab Results  Component Value Date   NA 140 10/16/2016   K 4.1 10/16/2016   CO2 30 10/16/2016   GLUCOSE 85 10/16/2016   BUN 15 10/16/2016   CREATININE 0.69 10/16/2016   BILITOT 0.5 10/16/2016   ALKPHOS 79 10/16/2016   AST 18 10/16/2016   ALT 20 10/16/2016   PROT 7.2 10/16/2016   ALBUMIN 4.2 10/16/2016   CALCIUM 9.6 10/16/2016   GFR 91.39 10/16/2016   Lab Results  Component Value Date   CHOL 218 (H) 10/16/2016   Lab Results  Component Value Date   HDL 58.90 10/16/2016   Lab Results  Component Value Date   LDLCALC 133 (H) 10/16/2016   Lab Results  Component Value Date   TRIG 128.0 10/16/2016   Lab Results  Component Value Date   CHOLHDL 4 10/16/2016   Lab Results  Component Value Date   HGBA1C 6.0 10/16/2016         Assessment & Plan:   Problem List Items Addressed This Visit    Hypothyroidism    On Levothyroxine, continue to monitor      Hyperlipidemia    Encouraged heart healthy diet, increase exercise, avoid trans fats, consider a krill oil cap daily. Does does not tolerate the statins      Relevant Orders   Lipid panel   Hypertension    Well controlled, no changes to meds. Encouraged heart healthy diet such as the DASH diet and exercise as tolerated.       Relevant Orders   CBC   Comprehensive metabolic panel   TSH   Arthritis    Pain in hands try Lidocaine gel      Vitamin D deficiency    Take Vitamin D daily, check level      Relevant Orders   VITAMIN D 25 Hydroxy (Vit-D Deficiency, Fractures)   Hyperglycemia - Primary    minimize simple carbs.  Increase exercise as tolerated.       Relevant Orders   Hemoglobin A1c   Thrombophlebitis    Was seen by vascular surgery and had some veins ablated with good results      Cervical cancer screening    Pap today, no concerns on exam.          I am having Ms. Colonna maintain her aspirin, SYNTHROID, losartan, Vitamin D (Ergocalciferol), Vitamin D (Ergocalciferol), and metroNIDAZOLE.  Meds ordered this encounter  Medications  . metroNIDAZOLE (METROCREAM) 0.75 % cream    Sig: Apply 2.35 application topically daily.    Refill:  5    CMA served as scribe during this visit. History, Physical and Plan performed by medical provider. Documentation and orders reviewed and attested to.  Penni Homans, MD

## 2017-03-01 NOTE — Assessment & Plan Note (Signed)
Take Vitamin D daily, check level

## 2017-03-01 NOTE — Assessment & Plan Note (Signed)
Pain in hands try Lidocaine gel

## 2017-03-01 NOTE — Assessment & Plan Note (Signed)
Well controlled, no changes to meds. Encouraged heart healthy diet such as the DASH diet and exercise as tolerated.  °

## 2017-03-01 NOTE — Assessment & Plan Note (Signed)
On Levothyroxine, continue to monitor 

## 2017-03-01 NOTE — Patient Instructions (Addendum)
Garcinia,, chromium picolinate to consider Lidocaine Gel Icy Hot, Aspercreme or Salon Pas  MIND or DASH diet Cholesterol Cholesterol is a white, waxy, fat-like substance that is needed by the human body in small amounts. The liver makes all the cholesterol we need. Cholesterol is carried from the liver by the blood through the blood vessels. Deposits of cholesterol (plaques) may build up on blood vessel (artery) walls. Plaques make the arteries narrower and stiffer. Cholesterol plaques increase the risk for heart attack and stroke. You cannot feel your cholesterol level even if it is very high. The only way to know that it is high is to have a blood test. Once you know your cholesterol levels, you should keep a record of the test results. Work with your health care provider to keep your levels in the desired range. What do the results mean?  Total cholesterol is a rough measure of all the cholesterol in your blood.  LDL (low-density lipoprotein) is the "bad" cholesterol. This is the type that causes plaque to build up on the artery walls. You want this level to be low.  HDL (high-density lipoprotein) is the "good" cholesterol because it cleans the arteries and carries the LDL away. You want this level to be high.  Triglycerides are fat that the body can either burn for energy or store. High levels are closely linked to heart disease. What are the desired levels of cholesterol?  Total cholesterol below 200.  LDL below 100 for people who are at risk, below 70 for people at very high risk.  HDL above 40 is good. A level of 60 or higher is considered to be protective against heart disease.  Triglycerides below 150. How can I lower my cholesterol? Diet Follow your diet program as told by your health care provider.  Choose fish or white meat chicken and [place], roasted or baked. Limit fatty cuts of red meat, fried foods, and processed meats, such as sausage and lunch meats.  Eat lots of  fresh fruits and vegetables.  Choose whole grains, beans, pasta, potatoes, and cereals.  Choose olive oil, corn oil, or canola oil, and use only small amounts.  Avoid butter, mayonnaise, shortening, or palm kernel oils.  Avoid foods with trans fats.  Drink skim or nonfat milk and eat low-fat or nonfat yogurt and cheeses. Avoid whole milk, cream, ice cream, egg yolks, and full-fat cheeses.  Healthier desserts include angel food cake, ginger snaps, animal crackers, hard candy, popsicles, and low-fat or nonfat frozen yogurt. Avoid pastries, cakes, pies, and cookies.  Exercise  Follow your exercise program as told by your health care provider. A regular program: ? Helps to decrease LDL and raise HDL. ? Helps with weight control.  Do things that increase your activity level, such as gardening, walking, and taking the stairs.  Ask your health care provider about ways that you can be more active in your daily life.  Medicine  Take over-the-counter and prescription medicines only as told by your health care provider. ? Medicine may be prescribed by your health care provider to help lower cholesterol and decrease the risk for heart disease. This is usually done if diet and exercise have failed to bring down cholesterol levels. ? If you have several risk factors, you may need medicine even if your levels are normal.  This information is not intended to replace advice given to you by your health care provider. Make sure you discuss any questions you have with your health care provider. Document  Released: 06/12/2001 Document Revised: 04/14/2016 Document Reviewed: 03/17/2016 Elsevier Interactive Patient Education  2017 [employer].

## 2017-03-01 NOTE — Assessment & Plan Note (Signed)
minimize simple carbs. Increase exercise as tolerated.  

## 2017-03-01 NOTE — Assessment & Plan Note (Signed)
Pap today, no concerns on exam.  

## 2017-03-04 ENCOUNTER — Other Ambulatory Visit: Payer: Self-pay | Admitting: Family Medicine

## 2017-03-04 MED ORDER — FENOFIBRATE MICRONIZED 130 MG PO CAPS: 130.0000 mg | ORAL_CAPSULE | Freq: Every day | ORAL | 3 refills | Status: DC

## 2017-03-04 MED ORDER — VITAMIN D (ERGOCALCIFEROL) 1.25 MG (50000 UNIT) PO CAPS: 50000.0000 [IU] | ORAL_CAPSULE | ORAL | 3 refills | Status: DC

## 2017-03-05 ENCOUNTER — Other Ambulatory Visit: Payer: Self-pay | Admitting: Family Medicine

## 2017-03-05 ENCOUNTER — Ambulatory Visit: Payer: BLUE CROSS/BLUE SHIELD | Admitting: Vascular Surgery

## 2017-03-05 DIAGNOSIS — R945 Abnormal results of liver function studies: Secondary | ICD-10-CM

## 2017-03-05 DIAGNOSIS — R1011 Right upper quadrant pain: Secondary | ICD-10-CM

## 2017-03-05 DIAGNOSIS — R7989 Other specified abnormal findings of blood chemistry: Secondary | ICD-10-CM

## 2017-03-07 ENCOUNTER — Ambulatory Visit (INDEPENDENT_AMBULATORY_CARE_PROVIDER_SITE_OTHER): Payer: BLUE CROSS/BLUE SHIELD

## 2017-03-07 DIAGNOSIS — R7989 Other specified abnormal findings of blood chemistry: Secondary | ICD-10-CM

## 2017-03-07 DIAGNOSIS — R1011 Right upper quadrant pain: Secondary | ICD-10-CM

## 2017-03-07 DIAGNOSIS — R945 Abnormal results of liver function studies: Secondary | ICD-10-CM | POA: Diagnosis not present

## 2017-03-07 LAB — CYTOLOGY - PAP
Diagnosis: UNDETERMINED — AB
HPV: NOT DETECTED

## 2017-07-03 ENCOUNTER — Other Ambulatory Visit: Payer: Self-pay

## 2017-07-03 ENCOUNTER — Other Ambulatory Visit: Payer: Self-pay | Admitting: Family Medicine

## 2017-07-03 NOTE — Progress Notes (Signed)
Lindsay Hodges/C'ed duplicate Vit-Rebecah Dangerfield Rx on file/thx dmf

## 2017-07-18 IMAGING — US US CAROTID DUPLEX BILAT
1 series · 13 of 24 positions shown · non-contrast
Comparison: 07/08/2014

CLINICAL DATA: 59-year-old female with a history of known carotid
disease.

Cardiovascular risk factors are prior tobacco exposure
EXAM:
BILATERAL CAROTID DUPLEX ULTRASOUND
TECHNIQUE: Gray scale imaging, color Doppler and duplex ultrasound were
performed of bilateral carotid and vertebral arteries in the neck.

[Series 1: us carotid duplex bilat · 0.05mm/px · 13 of 91 slices shown]
[im 1/91]
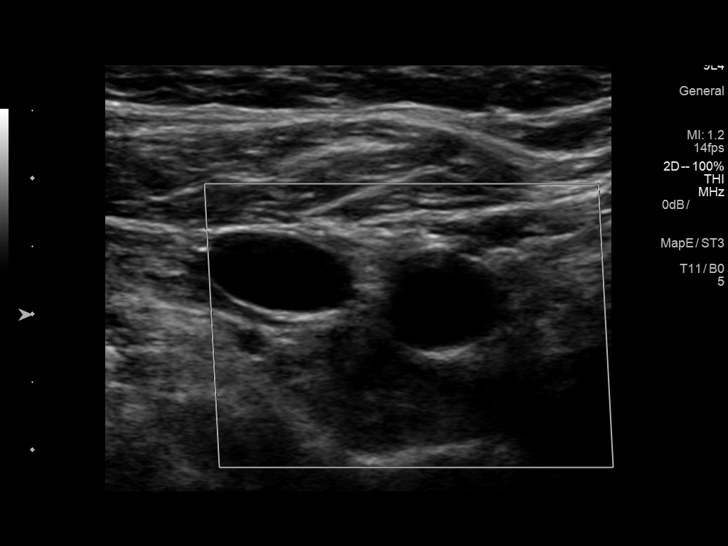
[im 8/91]
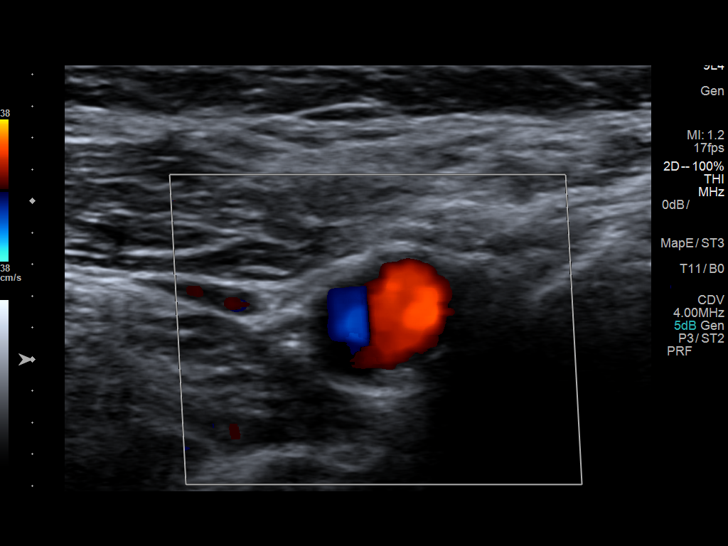
[im 16/91]
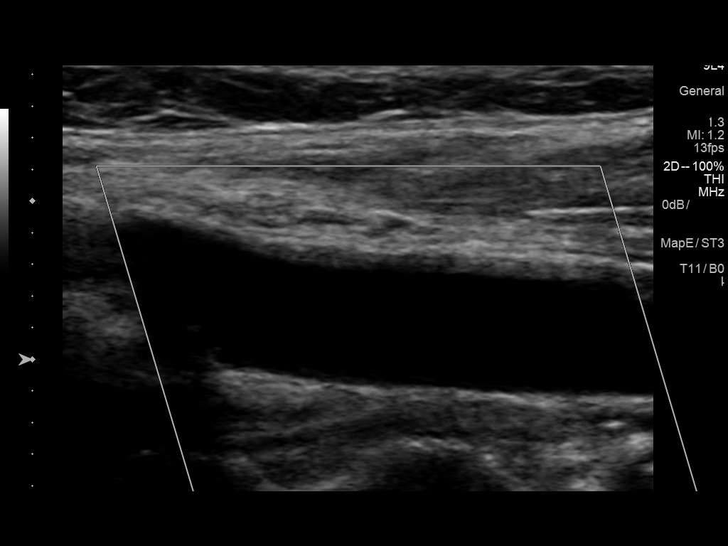
[im 24/91]
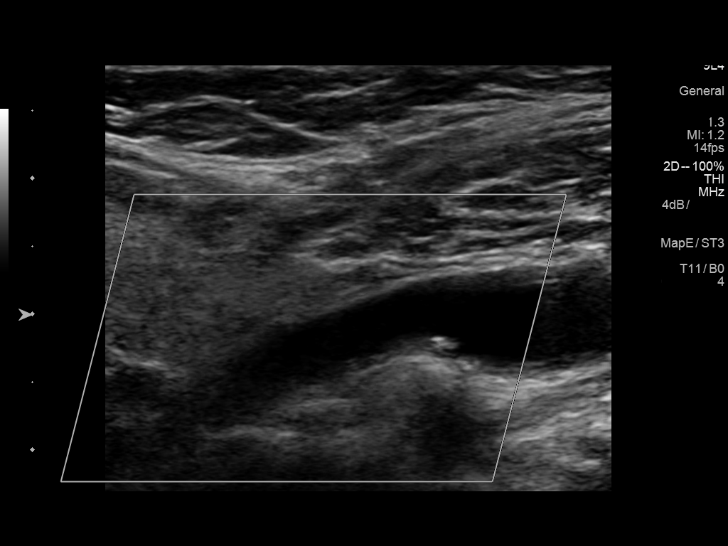
[im 32/91]
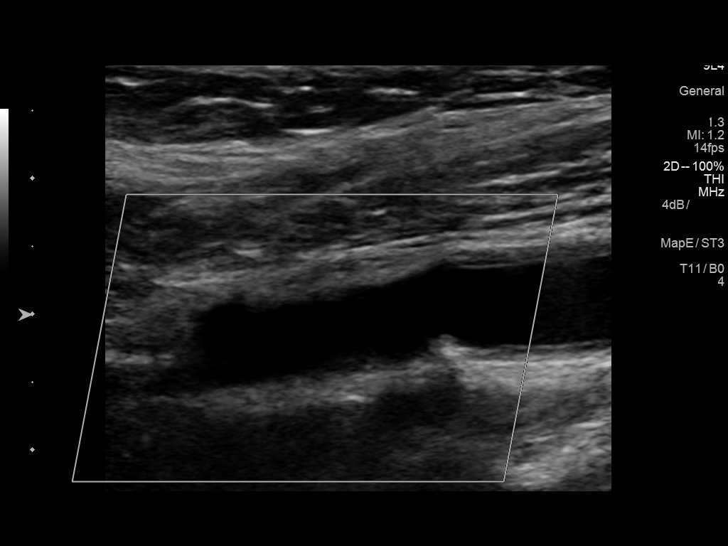
[im 40/91]
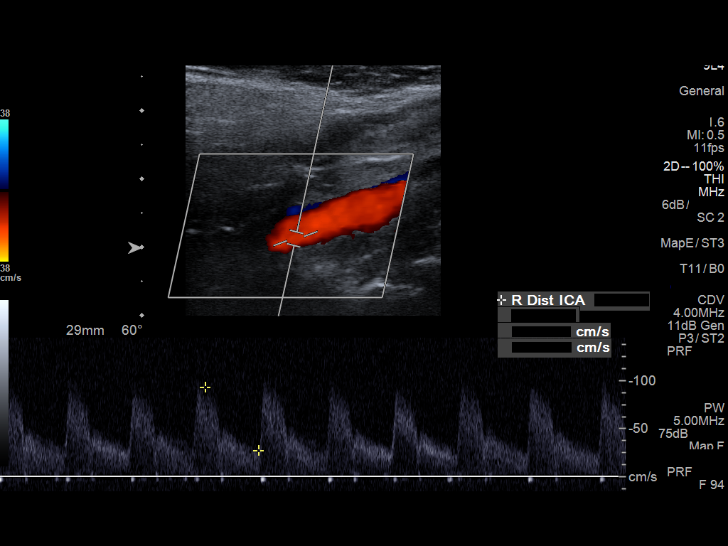
[im 47/91]
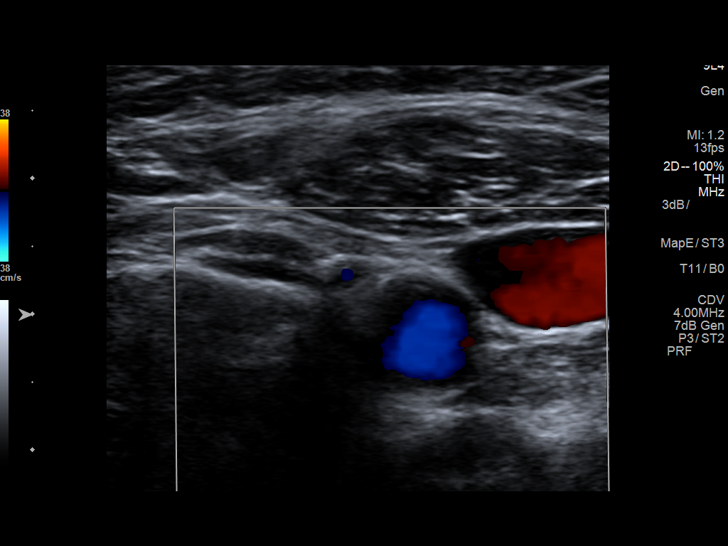
[im 51/91]
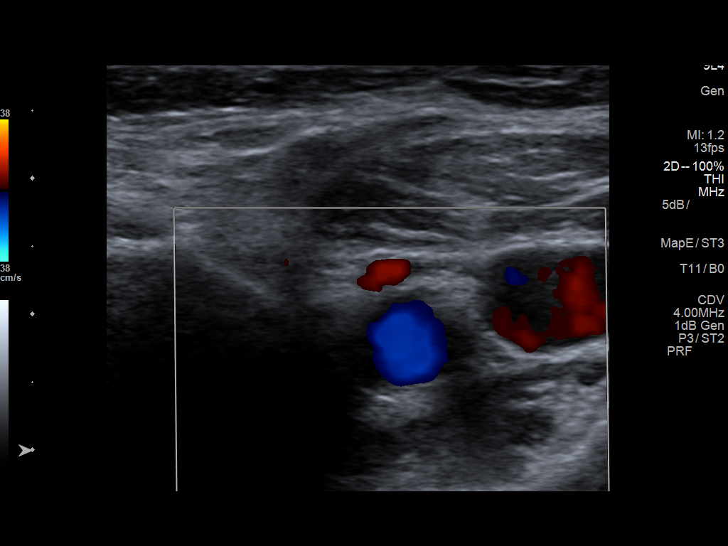
[im 59/91]
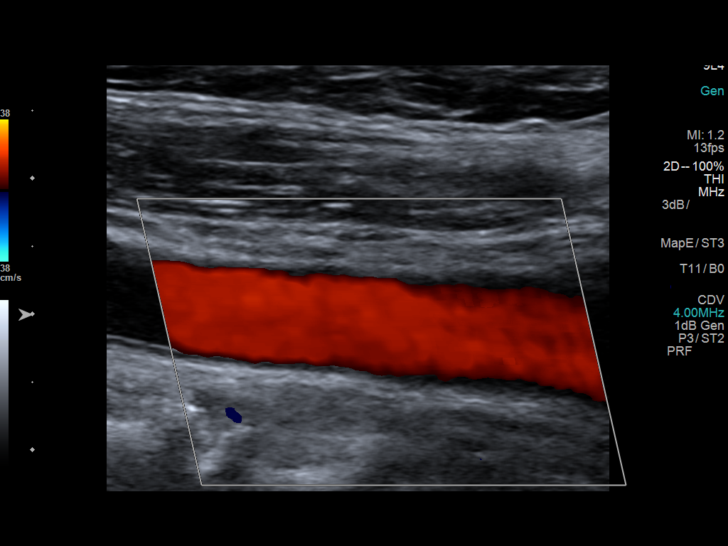
[im 67/91]
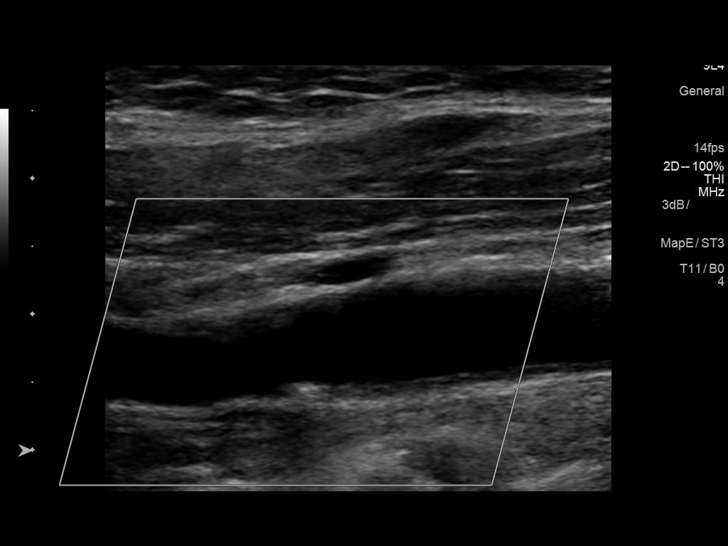
[im 75/91]
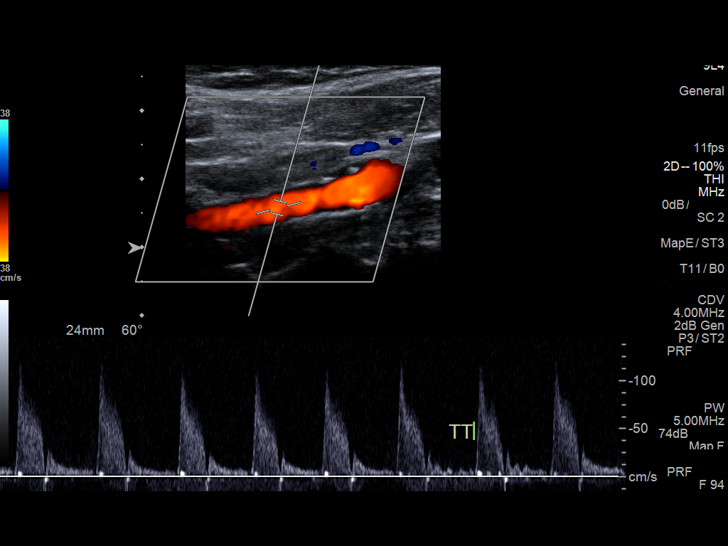
[im 83/91]
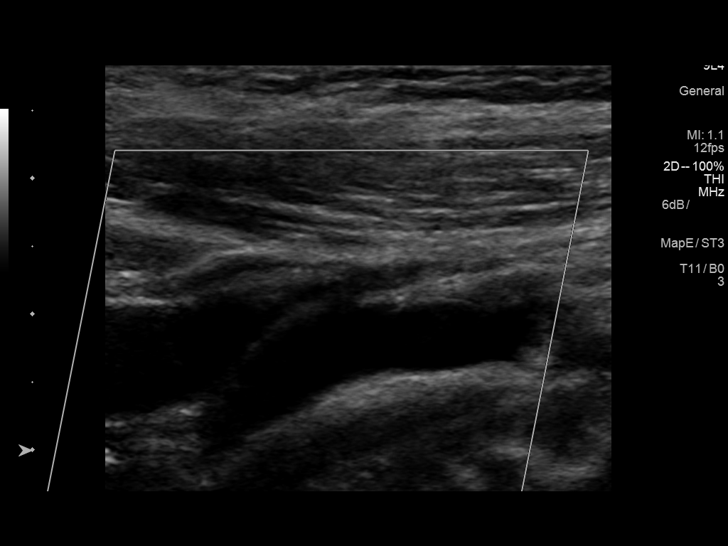
[im 91/91]
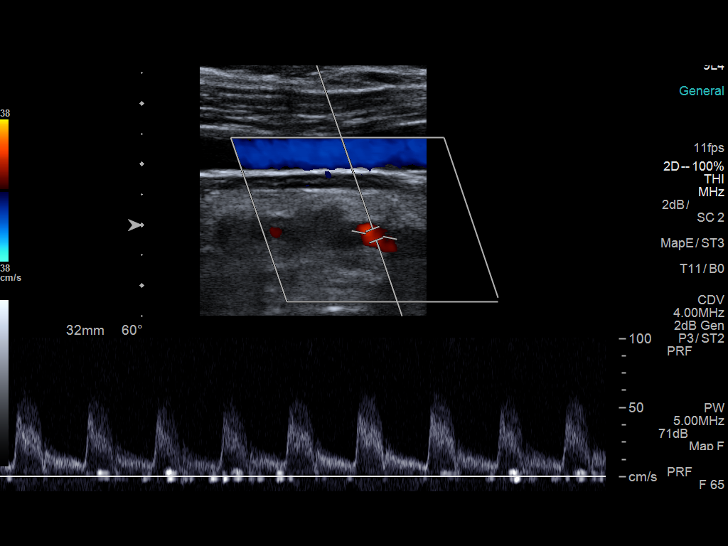

[13 of 24 positions shown; findings below may reference images not displayed]

FINDINGS: Criteria: Quantification of carotid stenosis is based on velocity
parameters that correlate the residual internal carotid diameter
with NASCET-based stenosis levels, using the diameter of the distal
internal carotid lumen as the denominator for stenosis measurement.

The following velocity measurements were obtained:

RIGHT

ICA:  Systolic 93 cm/sec, Diastolic 27 cm/sec

CCA:  87 cm/sec

SYSTOLIC ICA/CCA RATIO:

ECA:  99 cm/sec

LEFT

ICA:  Systolic 186 cm/sec, Diastolic 41 cm/sec

CCA:  84 cm/sec

SYSTOLIC ICA/CCA RATIO:

ECA:  88 cm/sec

Right Brachial SBP: 121

Left Brachial SBP: 128

RIGHT CAROTID ARTERY: No significant calcifications of the right
common carotid artery. Intermediate waveform maintained.
Heterogeneous and minimally calcified plaque at the right carotid
bifurcation. No significant lumen shadowing. Low resistance waveform
of the right ICA. No significant tortuosity.

RIGHT VERTEBRAL ARTERY: Antegrade flow with low resistance waveform.

LEFT CAROTID ARTERY: No significant calcifications of the left
common carotid artery. Intermediate waveform maintained.
Heterogeneous and partially calcified plaque at the left carotid
bifurcation without significant lumen shadowing. Low resistance
waveform of the left ICA. No significant tortuosity.

LEFT VERTEBRAL ARTERY:  Antegrade flow with low resistance waveform.
IMPRESSION: Right:

Color duplex indicates minimal heterogeneous and calcified plaque,
with no hemodynamically significant stenosis by duplex criteria in
the right-sided extracranial cerebrovascular circulation.

Left:

Duplex indicates moderate heterogeneous and partially calcified
plaque resulting in 50%- 69% stenosis by established duplex
criteria.

## 2017-07-20 ENCOUNTER — Other Ambulatory Visit: Payer: Self-pay | Admitting: Family Medicine

## 2017-09-25 DIAGNOSIS — R700 Elevated erythrocyte sedimentation rate: Secondary | ICD-10-CM | POA: Insufficient documentation

## 2017-10-08 NOTE — Progress Notes (Signed)
HPI: FU cerebrovascular disease. Echocardiogram in 2004 showed normal LV function. ABIs 6/16 normal. Nuclear study January 2017 showed ejection fraction 58% and normal perfusion.  Carotid Dopplers November 2017 showed no hemodynamically significant stenosis bilaterally.  Abdominal ultrasound June 2018 showed no aneurysm. Since last seen, the patient denies any dyspnea on exertion, orthopnea, PND, pedal edema, palpitations, syncope or chest pain.   Current Outpatient Medications  Medication Sig Dispense Refill  . aspirin 81 MG tablet Take 81 mg by mouth daily.      . fenofibrate micronized (ANTARA) 130 MG capsule Take 1 capsule (130 mg total) by mouth daily before breakfast. 30 capsule 3  . losartan (COZAAR) 50 MG tablet TAKE 1 TABLET (50 MG TOTAL) BY MOUTH DAILY. 90 tablet 2  . metroNIDAZOLE (METROCREAM) 0.75 % cream Apply 6.28 application topically daily.  5  . SYNTHROID 75 MCG tablet TAKE 1 TABLET (75 MCG TOTAL) BY MOUTH DAILY. 90 tablet 1  . Vitamin D, Ergocalciferol, (DRISDOL) 50000 units CAPS capsule Take 1 capsule (50,000 Units total) by mouth once a week. 12 capsule 3   No current facility-administered medications for this visit.      Past Medical History:  Diagnosis Date  . Arthritis   . Bursitis of hip 08/02/15  . Carotid disease, bilateral ([place])    [place] 10/15: R 40-59%, L 50-69%, stable for years  . Elevated sed rate    remote history of myelosuppressive disorder  . Grave's disease    s/p I-131 ablation 1990s  . Hyperlipidemia   . Hypertension   . HYPOTHYROIDISM   . Left knee pain 03/26/2014  . Personal history of colonic adenoma 11/30/2002  . Preventative health care 09/04/2015  . Thrombophlebitis 10/16/2016  . Vitamin D deficiency     Past Surgical History:  Procedure Laterality Date  . Jaw surgery  2001  . OOPHORECTOMY Bilateral   . VAGINAL HYSTERECTOMY  2009   vaginal hysterectomy    Social History   Socioeconomic History  . Marital status: Married   Spouse name: Not on file  . Number of children: Not on file  . Years of education: Not on file  . Highest education level: Not on file  Social Needs  . Financial resource strain: Not on file  . Food insecurity - worry: Not on file  . Food insecurity - inability: Not on file  . Transportation needs - medical: Not on file  . Transportation needs - non-medical: Not on file  Occupational History    Comment: [employer]  Tobacco Use  . Smoking status: Former Smoker    Packs/day: 0.50    Years: 12.00    Pack years: 6.00    Last attempt to quit: 02/06/1993    Years since quitting: 24.7  . Smokeless tobacco: Never Used  Substance and Sexual Activity  . Alcohol use: No  . Drug use: No  . Sexual activity: Yes  Other Topics Concern  . Not on file  Social History Narrative   pharmacy tech III    Family History  Problem Relation Age of Onset  . Hypertension Mother   . Arthritis Mother   . Hyperlipidemia Mother   . Stroke Mother        1st age 85, then recurrent 13  . Atrial fibrillation Mother   . Epilepsy Mother   . Arthritis Father   . Breast cancer Sister   . Cancer Sister        Recurrent Breast  CA  . Atrial fibrillation Brother   . Von Willebrand disease Daughter   . Other Daughter        POTS  . Factor V Leiden deficiency Daughter   . Breast cancer Other   . Drug abuse Maternal Aunt   . Diabetes Neg Hx   . Heart attack Neg Hx     ROS: no fevers or chills, productive cough, hemoptysis, dysphasia, odynophagia, melena, hematochezia, dysuria, hematuria, rash, seizure activity, orthopnea, PND, pedal edema, claudication. Remaining systems are negative.  Physical Exam: Well-developed well-nourished in no acute distress.  Skin is warm and dry.  HEENT is normal.  Neck is supple.  Chest is clear to auscultation with normal expansion.  Cardiovascular exam is regular rate and rhythm.  Abdominal exam nontender or distended. No masses palpated. Extremities show no  edema. neuro grossly intact  ECG- normal sinus rhythm at a rate of 68. No ST changes. personally reviewed  A/P  1 carotid artery disease-most recent Dopplers showed no significant stenosis.  2 hypertension-blood pressure is controlled. Continue present medications.  3 hyperlipidemia-intolerant to statins. Managed by primary care.  Kirk Ruths, MD

## 2017-10-15 ENCOUNTER — Ambulatory Visit: Payer: BLUE CROSS/BLUE SHIELD | Admitting: Cardiology

## 2017-10-15 ENCOUNTER — Encounter: Payer: Self-pay | Admitting: Cardiology

## 2017-10-15 VITALS — BP 128/62 | HR 68 | Ht 65.0 in | Wt 150.0 lb

## 2017-10-15 DIAGNOSIS — I10 Essential (primary) hypertension: Secondary | ICD-10-CM

## 2017-10-15 DIAGNOSIS — E7800 Pure hypercholesterolemia, unspecified: Secondary | ICD-10-CM

## 2017-10-15 DIAGNOSIS — I679 Cerebrovascular disease, unspecified: Secondary | ICD-10-CM | POA: Diagnosis not present

## 2017-10-15 NOTE — Patient Instructions (Signed)
Your physician wants you to follow-up in: ONE YEAR WITH DR CRENSHAW You will receive a reminder letter in the mail two months in advance. If you don't receive a letter, please call our office to schedule the follow-up appointment.   If you need a refill on your cardiac medications before your next appointment, please call your pharmacy.  

## 2017-10-29 ENCOUNTER — Telehealth: Payer: Self-pay

## 2017-10-29 ENCOUNTER — Encounter: Payer: Self-pay | Admitting: Family Medicine

## 2017-10-29 ENCOUNTER — Ambulatory Visit (INDEPENDENT_AMBULATORY_CARE_PROVIDER_SITE_OTHER): Payer: BLUE CROSS/BLUE SHIELD | Admitting: Family Medicine

## 2017-10-29 VITALS — BP 130/66 | HR 63 | Temp 97.4°F | Resp 18 | Ht 65.0 in | Wt 150.6 lb

## 2017-10-29 DIAGNOSIS — E039 Hypothyroidism, unspecified: Secondary | ICD-10-CM

## 2017-10-29 DIAGNOSIS — R739 Hyperglycemia, unspecified: Secondary | ICD-10-CM

## 2017-10-29 DIAGNOSIS — E559 Vitamin D deficiency, unspecified: Secondary | ICD-10-CM

## 2017-10-29 DIAGNOSIS — M8580 Other specified disorders of bone density and structure, unspecified site: Secondary | ICD-10-CM

## 2017-10-29 DIAGNOSIS — R399 Unspecified symptoms and signs involving the genitourinary system: Secondary | ICD-10-CM

## 2017-10-29 DIAGNOSIS — L578 Other skin changes due to chronic exposure to nonionizing radiation: Secondary | ICD-10-CM

## 2017-10-29 DIAGNOSIS — E7800 Pure hypercholesterolemia, unspecified: Secondary | ICD-10-CM | POA: Diagnosis not present

## 2017-10-29 DIAGNOSIS — R700 Elevated erythrocyte sedimentation rate: Secondary | ICD-10-CM | POA: Diagnosis not present

## 2017-10-29 DIAGNOSIS — Z0000 Encounter for general adult medical examination without abnormal findings: Secondary | ICD-10-CM | POA: Diagnosis not present

## 2017-10-29 DIAGNOSIS — I10 Essential (primary) hypertension: Secondary | ICD-10-CM

## 2017-10-29 LAB — CBC
HEMATOCRIT: 42.1 % (ref 36.0–46.0)
Hemoglobin: 13.6 g/dL (ref 12.0–15.0)
MCHC: 32.4 g/dL (ref 30.0–36.0)
MCV: 91.3 fl (ref 78.0–100.0)
Platelets: 184 10*3/uL (ref 150.0–400.0)
RBC: 4.61 Mil/uL (ref 3.87–5.11)
RDW: 13.6 % (ref 11.5–15.5)
WBC: 5.7 10*3/uL (ref 4.0–10.5)

## 2017-10-29 LAB — TSH: TSH: 1.25 u[IU]/mL (ref 0.35–4.50)

## 2017-10-29 LAB — LIPID PANEL
CHOL/HDL RATIO: 4
Cholesterol: 226 mg/dL — ABNORMAL HIGH (ref 0–200)
HDL: 64.2 mg/dL (ref >39.00)
LDL CALC: 140 mg/dL — AB (ref 0–99)
NonHDL: 161.35
TRIGLYCERIDES: 108 mg/dL (ref 0.0–149.0)
VLDL: 21.6 mg/dL (ref 0.0–40.0)

## 2017-10-29 LAB — COMPREHENSIVE METABOLIC PANEL
ALT: 27 U/L (ref 0–35)
AST: 26 U/L (ref 0–37)
Albumin: 4.1 g/dL (ref 3.5–5.2)
Alkaline Phosphatase: 84 U/L (ref 39–117)
BUN: 17 mg/dL (ref 6–23)
CHLORIDE: 103 meq/L (ref 96–112)
CO2: 30 meq/L (ref 19–32)
CREATININE: 0.78 mg/dL (ref 0.40–1.20)
Calcium: 9.2 mg/dL (ref 8.4–10.5)
GFR: 79.07 mL/min (ref >60.00)
Glucose, Bld: 90 mg/dL (ref 70–99)
POTASSIUM: 4 meq/L (ref 3.5–5.1)
SODIUM: 138 meq/L (ref 135–145)
Total Bilirubin: 0.7 mg/dL (ref 0.2–1.2)
Total Protein: 7.3 g/dL (ref 6.0–8.3)

## 2017-10-29 LAB — SEDIMENTATION RATE: SED RATE: 6 mm/h (ref 0–30)

## 2017-10-29 LAB — HEMOGLOBIN A1C: Hgb A1c MFr Bld: 5.9 % (ref 4.6–6.5)

## 2017-10-29 LAB — VITAMIN D 25 HYDROXY (VIT D DEFICIENCY, FRACTURES): VITD: 30.22 ng/mL (ref 30.00–100.00)

## 2017-10-29 NOTE — Assessment & Plan Note (Signed)
On Levothyroxine, continue to monitor 

## 2017-10-29 NOTE — Assessment & Plan Note (Signed)
Well controlled, no changes to meds. Encouraged heart healthy diet such as the DASH diet and exercise as tolerated.  °

## 2017-10-29 NOTE — Patient Instructions (Addendum)
If you do not hear from [facility] by July call regarding colonoscopy screening for October of this year.   Send my chart message each month to see if we have the Shingrix shot Preventive Care 40-64 Years, Female Preventive care refers to lifestyle choices and visits with your health care provider that can promote health and wellness. What does preventive care include?  A yearly physical exam. This is also called an annual well check.  Dental exams once or twice a year.  Routine eye exams. Ask your health care provider how often you should have your eyes checked.  Personal lifestyle choices, including: ? Daily care of your teeth and gums. ? Regular physical activity. ? Eating a healthy diet. ? Avoiding tobacco and drug use. ? Limiting alcohol use. ? Practicing safe sex. ? Taking low-dose aspirin daily starting at age 71. ? Taking vitamin and mineral supplements as recommended by your health care provider. What happens during an annual well check? The services and screenings done by your health care provider during your annual well check will depend on your age, overall health, lifestyle risk factors, and family history of disease. Counseling Your health care provider may ask you questions about your:  Alcohol use.  Tobacco use.  Drug use.  Emotional well-being.  Home and relationship well-being.  Sexual activity.  Eating habits.  Work and work Statistician.  Method of birth control.  Menstrual cycle.  Pregnancy history.  Screening You may have the following tests or measurements:  Height, weight, and BMI.  Blood pressure.  Lipid and cholesterol levels. These may be checked every 5 years, or more frequently if you are over 14 years old.  Skin check.  Lung cancer screening. You may have this screening every year starting at age 31 if you have a 30-pack-year history of smoking and currently smoke or have quit within the past 15 years.  Fecal occult blood test  (FOBT) of the stool. You may have this test every year starting at age 51.  Flexible sigmoidoscopy or colonoscopy. You may have a sigmoidoscopy every 5 years or a colonoscopy every 10 years starting at age 63.  Hepatitis C blood test.  Hepatitis B blood test.  Sexually transmitted disease (STD) testing.  Diabetes screening. This is done by checking your blood sugar (glucose) after you have not eaten for a while (fasting). You may have this done every 1-3 years.  Mammogram. This may be done every 1-2 years. Talk to your health care provider about when you should start having regular mammograms. This may depend on whether you have a family history of breast cancer.  BRCA-related cancer screening. This may be done if you have a family history of breast, ovarian, tubal, or peritoneal cancers.  Pelvic exam and Pap test. This may be done every 3 years starting at age 73. Starting at age 32, this may be done every 5 years if you have a Pap test in combination with an HPV test.  Bone density scan. This is done to screen for osteoporosis. You may have this scan if you are at high risk for osteoporosis.  Discuss your test results, treatment options, and if necessary, the need for more tests with your health care provider. Vaccines Your health care provider may recommend certain vaccines, such as:  Influenza vaccine. This is recommended every year.  Tetanus, diphtheria, and acellular pertussis (Tdap, Td) vaccine. You may need a Td booster every 10 years.  Varicella vaccine. You may need this if you  have not been vaccinated.  Zoster vaccine. You may need this after age 100.  Measles, mumps, and rubella (MMR) vaccine. You may need at least one dose of MMR if you were born in 1957 or later. You may also need a second dose.  Pneumococcal 13-valent conjugate (PCV13) vaccine. You may need this if you have certain conditions and were not previously vaccinated.  Pneumococcal polysaccharide (PPSV23)  vaccine. You may need one or two doses if you smoke cigarettes or if you have certain conditions.  Meningococcal vaccine. You may need this if you have certain conditions.  Hepatitis A vaccine. You may need this if you have certain conditions or if you travel or work in places where you may be exposed to hepatitis A.  Hepatitis B vaccine. You may need this if you have certain conditions or if you travel or work in places where you may be exposed to hepatitis B.  Haemophilus influenzae type b (Hib) vaccine. You may need this if you have certain conditions.  Talk to your health care provider about which screenings and vaccines you need and how often you need them. This information is not intended to replace advice given to you by your health care provider. Make sure you discuss any questions you have with your health care provider. Document Released: 10/14/2015 Document Revised: 06/06/2016 Document Reviewed: 07/19/2015 Elsevier Interactive Patient Education  [employer].

## 2017-10-29 NOTE — Assessment & Plan Note (Signed)
Encouraged heart healthy diet, increase exercise, avoid trans fats, consider a krill oil cap daily 

## 2017-10-29 NOTE — Assessment & Plan Note (Signed)
hgba1c acceptable, minimize simple carbs. Increase exercise as tolerated.  

## 2017-10-29 NOTE — Assessment & Plan Note (Signed)
Check sed rate

## 2017-10-29 NOTE — Progress Notes (Signed)
Subjective:  I acted as a Education administrator for Dr. Charlett Blake. [place], [place]  Patient ID: Lindsay Hodges, female    DOB: 1956/05/28, 62 y.o.   MRN: [id number]  Chief Complaint  Patient presents with  . Annual Exam    HPI  Patient is in today for an annual exam and follow up on chronic medical concerns including vitamin D deficiency, hypothyroidism, Htn, hyperlipidemia.  She feels well today.  She did have a fall on the ice a couple weeks ago while walking her dog.  [place] backwards hitting her head and felt she had a bit of whiplash with a sore neck.  That is resolved.  She did have a mild pressure headache but no associated symptoms such as photophobia after the fall but that is resolved.  Did also have some nausea but that is also resolved.  She hit her right elbow when she fell it was swollen and painful.  The swelling has gone down the pain is resolved but there is still a small bump on the flexor surface of the elbow she is questioning.  No numbness tingling weakness into the arm.  No other recent falls.  No other acute concerns.  No recent febrile illness or hospitalizations.  Is doing well with her activities of daily living.  No polyuria or polydipsia. Denies CP/palp/SOB/HA/congestion/fevers/GI or GU c/o. Taking meds as prescribed. Not currently taking any Vitamin D    Patient Care Team: Mosie Lukes, MD as PCP - General (Family Medicine) Stanford Breed Denice Bors, MD as PCP - Cardiology (Cardiology) Gatha Mayer, MD (Gastroenterology) Camillo Flaming, OD as Referring Physician (Optometry)   Past Medical History:  Diagnosis Date  . Arthritis   . Bursitis of hip 08/02/15  . Carotid disease, bilateral ([place])    [place] 10/15: R 40-59%, L 50-69%, stable for years  . Elevated sed rate    remote history of myelosuppressive disorder  . Grave's disease    s/p I-131 ablation 1990s  . Hyperlipidemia   . Hypertension   . HYPOTHYROIDISM   . Left knee pain 03/26/2014  . Personal history of colonic adenoma  11/30/2002  . Preventative health care 09/04/2015  . Thrombophlebitis 10/16/2016  . Vitamin D deficiency     Past Surgical History:  Procedure Laterality Date  . Jaw surgery  2001  . OOPHORECTOMY Bilateral   . VAGINAL HYSTERECTOMY  2009   vaginal hysterectomy    Family History  Problem Relation Age of Onset  . Hypertension Mother   . Arthritis Mother   . Hyperlipidemia Mother   . Stroke Mother        1st age 20, then recurrent 35  . Atrial fibrillation Mother   . Epilepsy Mother   . Arthritis Father   . Breast cancer Sister   . Cancer Sister        Recurrent Breast CA  . Atrial fibrillation Brother   . Von Willebrand disease Daughter   . Other Daughter        POTS  . Factor V Leiden deficiency Daughter   . Breast cancer Other   . Drug abuse Maternal Aunt   . Diabetes Neg Hx   . Heart attack Neg Hx     Social History   Socioeconomic History  . Marital status: Married    Spouse name: Not on file  . Number of children: Not on file  . Years of education: Not on file  . Highest education level: Not on file  Social Needs  .  Financial resource strain: Not on file  . Food insecurity - worry: Not on file  . Food insecurity - inability: Not on file  . Transportation needs - medical: Not on file  . Transportation needs - non-medical: Not on file  Occupational History    Comment: [employer]  Tobacco Use  . Smoking status: Former Smoker    Packs/day: 0.50    Years: 12.00    Pack years: 6.00    Last attempt to quit: 02/06/1993    Years since quitting: 24.7  . Smokeless tobacco: Never Used  Substance and Sexual Activity  . Alcohol use: No  . Drug use: No  . Sexual activity: Yes  Other Topics Concern  . Not on file  Social History Narrative   pharmacy tech III    Outpatient Medications Prior to Visit  Medication Sig Dispense Refill  . aspirin 81 MG tablet Take 81 mg by mouth daily.      Marland Kitchen losartan (COZAAR) 50 MG tablet TAKE 1 TABLET (50 MG TOTAL) BY  MOUTH DAILY. 90 tablet 2  . metroNIDAZOLE (METROCREAM) 0.75 % cream Apply 4.97 application topically daily.  5  . SYNTHROID 75 MCG tablet TAKE 1 TABLET (75 MCG TOTAL) BY MOUTH DAILY. 90 tablet 1  . fenofibrate micronized (ANTARA) 130 MG capsule Take 1 capsule (130 mg total) by mouth daily before breakfast. 30 capsule 3  . Vitamin D, Ergocalciferol, (DRISDOL) 50000 units CAPS capsule Take 1 capsule (50,000 Units total) by mouth once a week. 12 capsule 3   No facility-administered medications prior to visit.     Allergies  Allergen Reactions  . Penicillins     REACTION: swelling/dyspnea  . Statins Other (See Comments)    Myalgias on several statins    Review of Systems  Constitutional: Negative for fever and malaise/fatigue.  HENT: Negative for congestion.   Eyes: Negative for blurred vision.  Respiratory: Negative for cough and shortness of breath.   Cardiovascular: Negative for chest pain, palpitations and leg swelling.  Gastrointestinal: Negative for vomiting.  Musculoskeletal: Positive for falls and joint pain. Negative for back pain.  Skin: Negative for rash.  Neurological: Positive for headaches. Negative for loss of consciousness.       Objective:    Physical Exam  Constitutional: She is oriented to person, place, and time. She appears well-developed and well-nourished. No distress.  HENT:  Head: Normocephalic and atraumatic.  Eyes: Conjunctivae are normal.  Neck: Normal range of motion. No thyromegaly present.  Cardiovascular: Normal rate and regular rhythm.  Pulmonary/Chest: Effort normal and breath sounds normal. She has no wheezes.  Abdominal: Soft. Bowel sounds are normal. There is no tenderness.  Musculoskeletal: Normal range of motion. She exhibits no edema or deformity.  nontender 4-5 mm raised bump over flexor surface of right elbow.  Neurological: She is alert and oriented to person, place, and time.  Skin: Skin is warm and dry. She is not diaphoretic.    Psychiatric: She has a normal mood and affect.    BP 130/66 (BP Location: Left Arm, Patient Position: Sitting, Cuff Size: Normal)   Pulse 63   Temp (!) 97.4 F (36.3 C) (Oral)   Resp 18   Ht 5\' 5"  (1.651 m)   Wt 150 lb 9.6 oz (68.3 kg)   SpO2 98%   BMI 25.06 kg/m  Wt Readings from Last 3 Encounters:  10/29/17 150 lb 9.6 oz (68.3 kg)  10/15/17 150 lb (68 kg)  03/01/17 157 lb 12.8  oz (71.6 kg)   BP Readings from Last 3 Encounters:  10/29/17 130/66  10/15/17 128/62  03/01/17 128/68     Immunization History  Administered Date(s) Administered  . Influenza Split 07/01/2012, 07/01/2013  . Influenza-Unspecified 06/15/2014, 06/02/2015, 06/23/2017  . Tdap 10/02/2011  . Zoster 08/23/2014    Health Maintenance  Topic Date Due  . HIV Screening  12/29/1968  . MAMMOGRAM  12/26/2017  . COLONOSCOPY  07/09/2018  . PAP SMEAR  03/01/2020  . TETANUS/TDAP  10/01/2021  . INFLUENZA VACCINE  Completed  . Hepatitis C Screening  Completed    Lab Results  Component Value Date   WBC 5.5 03/01/2017   HGB 13.5 03/01/2017   HCT 41.2 03/01/2017   PLT 200.0 03/01/2017   GLUCOSE 83 03/01/2017   CHOL 210 (H) 03/01/2017   TRIG 114.0 03/01/2017   HDL 52.60 03/01/2017   LDLCALC 134 (H) 03/01/2017   ALT 81 (H) 03/01/2017   AST 45 (H) 03/01/2017   NA 140 03/01/2017   K 4.1 03/01/2017   CL 104 03/01/2017   CREATININE 0.75 03/01/2017   BUN 16 03/01/2017   CO2 31 03/01/2017   TSH 1.37 03/01/2017   HGBA1C 6.1 03/01/2017    Lab Results  Component Value Date   TSH 1.37 03/01/2017   Lab Results  Component Value Date   WBC 5.5 03/01/2017   HGB 13.5 03/01/2017   HCT 41.2 03/01/2017   MCV 90.3 03/01/2017   PLT 200.0 03/01/2017   Lab Results  Component Value Date   NA 140 03/01/2017   K 4.1 03/01/2017   CO2 31 03/01/2017   GLUCOSE 83 03/01/2017   BUN 16 03/01/2017   CREATININE 0.75 03/01/2017   BILITOT 0.5 03/01/2017   ALKPHOS 86 03/01/2017   AST 45 (H) 03/01/2017   ALT 81 (H)  03/01/2017   PROT 7.3 03/01/2017   ALBUMIN 4.3 03/01/2017   CALCIUM 9.7 03/01/2017   GFR 82.91 03/01/2017   Lab Results  Component Value Date   CHOL 210 (H) 03/01/2017   Lab Results  Component Value Date   HDL 52.60 03/01/2017   Lab Results  Component Value Date   LDLCALC 134 (H) 03/01/2017   Lab Results  Component Value Date   TRIG 114.0 03/01/2017   Lab Results  Component Value Date   CHOLHDL 4 03/01/2017   Lab Results  Component Value Date   HGBA1C 6.1 03/01/2017         Assessment & Plan:   Problem List Items Addressed This Visit    Hypothyroidism    On Levothyroxine, continue to monitor      Hyperlipidemia    Encouraged heart healthy diet, increase exercise, avoid trans fats, consider a krill oil cap daily      Relevant Orders   Lipid panel   Hypertension    Well controlled, no changes to meds. Encouraged heart healthy diet such as the DASH diet and exercise as tolerated.       Relevant Orders   CBC   Comprehensive metabolic panel   TSH   Vitamin D deficiency    Check vit d level      Relevant Orders   VITAMIN D 25 Hydroxy (Vit-D Deficiency, Fractures)   UTI symptoms   Preventative health care    Patient encouraged to maintain heart healthy diet, regular exercise, adequate sleep. Consider daily probiotics. Take medications as prescribed.      Sun-damaged skin   Relevant Orders   Ambulatory referral to Dermatology  Hyperglycemia    hgba1c acceptable, minimize simple carbs. Increase exercise as tolerated.       Relevant Orders   Hemoglobin A1c   Elevated sed rate    Check sed rate      Relevant Orders   Sedimentation rate    Other Visit Diagnoses    Osteopenia, unspecified location    -  Primary   Relevant Orders   DG Bone Density      I have discontinued Kenyonna Bradham's fenofibrate micronized and Vitamin D (Ergocalciferol). I am also having her maintain her aspirin, metroNIDAZOLE, SYNTHROID, and losartan.  No orders of  the defined types were placed in this encounter.   CMA served as Education administrator during this visit. History, Physical and Plan performed by medical provider. Documentation and orders reviewed and attested to.  Penni Homans, MD

## 2017-10-29 NOTE — Assessment & Plan Note (Deleted)
Check level today 

## 2017-10-29 NOTE — Assessment & Plan Note (Signed)
Patient encouraged to maintain heart healthy diet, regular exercise, adequate sleep. Consider daily probiotics. Take medications as prescribed 

## 2017-10-29 NOTE — Assessment & Plan Note (Signed)
Check vit d level 

## 2017-10-29 NOTE — Telephone Encounter (Signed)
-----   Message from Matilde Sprang, RN sent at 10/29/2017  3:31 PM EST ----- Pt given lab results per notes of Dr. Gracelyn Nurse on 10/29/17. Pt verbalized understanding. A prescription will need to be sent for Vit D 50000 IU caps. She says she currently takes Vit D 2000 IU daily and will continue that.

## 2017-11-01 ENCOUNTER — Telehealth: Payer: Self-pay | Admitting: Family Medicine

## 2017-11-01 NOTE — Telephone Encounter (Signed)
Spoke with patient.

## 2017-11-01 NOTE — Telephone Encounter (Signed)
Copied from [place]. Topic: Quick Communication - See Telephone Encounter >> Nov 01, 2017 12:38 PM Vernona Rieger wrote: CRM for notification. See Telephone encounter for:   11/01/17.  Patient wants to know how much Astrid Drafts to take? She said Dr Charlett Blake mentioned this to her on 1/29. Also, is there a certain brand? Call back is [phone]

## 2017-11-12 ENCOUNTER — Ambulatory Visit (INDEPENDENT_AMBULATORY_CARE_PROVIDER_SITE_OTHER): Payer: BLUE CROSS/BLUE SHIELD

## 2017-11-12 DIAGNOSIS — M8588 Other specified disorders of bone density and structure, other site: Secondary | ICD-10-CM

## 2017-11-12 DIAGNOSIS — M85852 Other specified disorders of bone density and structure, left thigh: Secondary | ICD-10-CM | POA: Diagnosis not present

## 2017-11-12 DIAGNOSIS — M8580 Other specified disorders of bone density and structure, unspecified site: Secondary | ICD-10-CM

## 2017-12-09 ENCOUNTER — Ambulatory Visit: Payer: BLUE CROSS/BLUE SHIELD | Admitting: Family Medicine

## 2017-12-09 ENCOUNTER — Encounter: Payer: Self-pay | Admitting: Family Medicine

## 2017-12-09 ENCOUNTER — Ambulatory Visit (INDEPENDENT_AMBULATORY_CARE_PROVIDER_SITE_OTHER): Payer: BLUE CROSS/BLUE SHIELD

## 2017-12-09 VITALS — BP 131/68 | HR 57 | Ht 65.0 in | Wt 152.0 lb

## 2017-12-09 DIAGNOSIS — M25421 Effusion, right elbow: Secondary | ICD-10-CM

## 2017-12-09 DIAGNOSIS — M25521 Pain in right elbow: Secondary | ICD-10-CM

## 2017-12-09 MED ORDER — DICLOFENAC SODIUM 1 % TD GEL: 2.0000 g | Freq: Four times a day (QID) | TRANSDERMAL | 11 refills | Status: DC

## 2017-12-09 NOTE — Progress Notes (Signed)
Subjective:    I'm seeing this patient as a consultation for:  Lindsay Lukes, MD   CC: right elbow swelling  HPI: Lindsay Hodges slipped and fell on ice in January landing on her right elbow.  She had initial pain and swelling.  However the pain and swelling subsequently resolved.  She did well until recently where she has bumped her elbow once or twice and had recurrent bruising without significant olecranon swelling.  She notes this keeps happening and is obnoxious and sometimes painful.  She notes that she is not much limited in her activities at home or at work but does note that the pain is obnoxious.  She is tried ice and rest which helped only a little.  She denies any recent injury only mild pressure on her elbow at times for example getting into bed prompting the bruising and soreness.  Past medical history, Surgical history, Family history not pertinant except as noted below, Social history, Allergies, and medications have been entered into the medical record, reviewed, and no changes needed.   Review of Systems: No headache, visual changes, nausea, vomiting, diarrhea, constipation, dizziness, abdominal pain, skin rash, fevers, chills, night sweats, weight loss, swollen lymph nodes, body aches, joint swelling, muscle aches, chest pain, shortness of breath, mood changes, visual or auditory hallucinations.   Objective:    Vitals:   12/09/17 1013  BP: 131/68  Pulse: (!) 57   General: Well Developed, well nourished, and in no acute distress.  Neuro/Psych: Alert and oriented x3, extra-ocular muscles intact, able to move all 4 extremities, sensation grossly intact. Skin: Warm and dry, no rashes noted.  Respiratory: Not using accessory muscles, speaking in full sentences, trachea midline.  Cardiovascular: Pulses palpable, no extremity edema. Abdomen: Does not appear distended. MSK:  Right elbow: Mild ecchymosis at the [facility] area with no significant swelling at the tip of the olecranon.   The tip of the olecranon is mildly sore to touch with a defect palpated in the cartilage or periosteum lining the bone.  Motion and strength is intact.  X-ray right elbow independent review of images: Mild medial degenerative changes.  No hip fractures or acute findings. Awaiting formal radiology review    Impression and Recommendations:    Assessment and Plan: 62 y.o. female with bruising at the olecranon that is not classic for olecranon bursitis.  Likely reinjury of the healing tissue.  No significant swelling.  Think it is reasonable to proceed with compression ice and diclofenac gel.  If not improving next step would be advanced imaging or potential injection.  Recheck as needed in 4-6 weeks.    Orders Placed This Encounter  Procedures  . DG Elbow Complete Right    Standing Status:   Future    Number of Occurrences:   1    Standing Expiration Date:   02/09/2019    Order Specific Question:   Reason for Exam (SYMPTOM  OR DIAGNOSIS REQUIRED)    Answer:   swelling right elbow after trauma 2-3 months ago    Order Specific Question:   Preferred imaging location?    Answer:   [facility]    Order Specific Question:   Radiology Contrast Protocol - do NOT remove file path    Answer:   [email or web address]   Meds ordered this encounter  Medications  . diclofenac sodium (VOLTAREN) 1 % GEL    Sig: Apply 2 g topically 4 (four) times daily. To affected joint.    Dispense:  100 g  Refill:  11    Discussed warning signs or symptoms. Please see discharge instructions. Patient expresses understanding.

## 2017-12-09 NOTE — Patient Instructions (Addendum)
Thank you for coming in today. Pad the elbow for about 1 month.  Use the gel up to 4x daily as needed for pain or swelling.  Apply ice if it bruises or hurts for about 10-20 mins at night.  Recheck in 4-6 week if still happening.    Elbow Bursitis A bursa is a fluid-filled sac that covers and protects a joint. Bursitis is when the fluid-filled sac gets puffy and sore (inflamed). Elbow bursitis, also called olecranon bursitis, happens over your elbow. This may be caused by:  Injury (acute trauma) to your elbow.  Leaning on hard surfaces for long periods of time.  Infection from an injury that breaks the skin near your elbow.  A bone growth (spur) that forms at the tip of your elbow.  A medical condition that causes inflammation in your body, such as: ? Gout. ? Rheumatoid arthritis.  Sometimes the cause is not known. Follow these instructions at home:  Take medicines only as told by your doctor.  If you were prescribed an antibiotic medicine, finish all of it even if you start to feel better.  If your bursitis is caused by an injury, rest your elbow and wear your bandage as told by your doctor. You may also apply ice to the injured area as told by your doctor: ? Put ice in a plastic bag. ? Place a towel between your skin and the bag. ? Leave the ice on for 20 minutes, 2-3 times per day.  Do not do any activities that cause pain to your elbow.  Use elbow pads or wraps to cushion your elbow. Contact a doctor if:  You have a fever.  Your symptoms do not get better with treatment.  Your pain or swelling gets worse.  Your pain or swelling goes away and then comes back.  You have drainage of pus from the swollen area over your elbow. This information is not intended to replace advice given to you by your health care provider. Make sure you discuss any questions you have with your health care provider. Document Released: 03/07/2010 Document Revised: 02/23/2016 Document  Reviewed: 05/26/2014 Elsevier Interactive Patient Education  [employer].

## 2017-12-19 ENCOUNTER — Ambulatory Visit: Payer: BLUE CROSS/BLUE SHIELD | Admitting: Family Medicine

## 2017-12-19 ENCOUNTER — Telehealth: Payer: Self-pay | Admitting: Neurology

## 2017-12-19 ENCOUNTER — Ambulatory Visit (INDEPENDENT_AMBULATORY_CARE_PROVIDER_SITE_OTHER): Payer: BLUE CROSS/BLUE SHIELD

## 2017-12-19 ENCOUNTER — Encounter: Payer: Self-pay | Admitting: Family Medicine

## 2017-12-19 VITALS — BP 122/60 | HR 78 | Resp 16 | Ht 65.0 in | Wt 152.0 lb

## 2017-12-19 DIAGNOSIS — R51 Headache: Secondary | ICD-10-CM | POA: Diagnosis not present

## 2017-12-19 DIAGNOSIS — R404 Transient alteration of awareness: Secondary | ICD-10-CM

## 2017-12-19 DIAGNOSIS — S0093XD Contusion of unspecified part of head, subsequent encounter: Secondary | ICD-10-CM | POA: Diagnosis not present

## 2017-12-19 NOTE — Progress Notes (Signed)
[facility] [address], [place], [place] [address] [phone]  Date:  12/19/2017   Name:  Lindsay Hodges   DOB:  06/04/1956   MRN:  [id number]  PCP:  Mosie Lukes, MD    Chief Complaint: Headache (fell in january landed on head, last 2 weeks left side of head feels "fuzzy" and "tingles", off and on)   History of Present Illness:  Lindsay Hodges is a 62 y.o. very pleasant female patient who presents with the following:  Pt of Dr. Charlett Blake here today with concern of a strange sensation in the left side of her head History of HTN, hypothyroidism Lab Results  Component Value Date   TSH 1.25 10/29/2017    She fell on the ice back in January and fell, hit her head. No LOC, did not seem to be seriously injured She got up and went on walking, she did have sounds like a mild concussion with concussion sx for 2-3 days She got back to normal and felt ok  However 2 weeks ago she noted a feeling of "fogginess" in the left side of her head and like her ear is temporarily blocked This sensation may last about 8 minutes and then will start to resolve and go away.  Otherwise her hearing is fine so she does not think this is her ear It is not a pain or headache It feels like "a numbing, tingling like feeling" but is not painful.  This may occur up to three times a day When this happens her left eye seems to not focus correctly but it will then resolve  Last week she had a "mild bug" but these viral illness sx are now resolved  No vomiting No slurred speech, no swallowing issues No facial droop No movement or limb control concerns   Never had anything like this in the past   She does have a history of mild carotid disease: 11/17:  IMPRESSION: Minor carotid atherosclerosis. No hemodynamically significant ICA stenosis. Degree of narrowing estimated less than 50% bilaterally.  Patent antegrade vertebral flow  bilaterally  As few years ago she got quite anemic but this resolved eventually on it's own  She does take losartan and synthroid/. She does not always take her aspirin 52  Never had a CVA or TIA  Patient Active Problem List   Diagnosis Date Noted  . Elevated sed rate   . Cervical cancer screening 03/01/2017  . Hyperglycemia 10/16/2016  . Thrombophlebitis 10/16/2016  . Plantar fasciitis, left 01/10/2016  . Preventative health care 09/04/2015  . Sun-damaged skin 09/04/2015  . Bursitis of hip 08/02/2015  . Low back pain radiating to both legs 03/30/2015  . UTI symptoms 03/16/2015  . Trochanteric bursitis of right hip 02/14/2015  . Left knee pain 03/26/2014  . Seasonal affective disorder ([place]) 11/25/2012  . Vitamin D deficiency 02/14/2012  . Hyperlipidemia   . Hypertension   . Graves' disease   . Arthritis   . Carotid disease, bilateral ([place])   . CAROTID BRUIT, LEFT 04/11/2009  . Hypothyroidism 01/06/2009  . Personal history of colonic adenoma 11/30/2002    Past Medical History:  Diagnosis Date  . Arthritis   . Bursitis of hip 08/02/15  . Carotid disease, bilateral ([place])    [place] 10/15: R 40-59%, L 50-69%, stable for years  . Elevated sed rate    remote history of myelosuppressive disorder  . Grave's disease  s/p I-131 ablation 1990s  . Hyperlipidemia   . Hypertension   . HYPOTHYROIDISM   . Left knee pain 03/26/2014  . Personal history of colonic adenoma 11/30/2002  . Preventative health care 09/04/2015  . Thrombophlebitis 10/16/2016  . Vitamin D deficiency     Past Surgical History:  Procedure Laterality Date  . Jaw surgery  2001  . OOPHORECTOMY Bilateral   . VAGINAL HYSTERECTOMY  2009   vaginal hysterectomy    Social History   Tobacco Use  . Smoking status: Former Smoker    Packs/day: 0.50    Years: 12.00    Pack years: 6.00    Last attempt to quit: 02/06/1993    Years since quitting: 24.8  . Smokeless tobacco: Never Used  Substance Use Topics  .  Alcohol use: No  . Drug use: No    Family History  Problem Relation Age of Onset  . Hypertension Mother   . Arthritis Mother   . Hyperlipidemia Mother   . Stroke Mother        1st age 9, then recurrent 27  . Atrial fibrillation Mother   . Epilepsy Mother   . Arthritis Father   . Breast cancer Sister   . Cancer Sister        Recurrent Breast CA  . Atrial fibrillation Brother   . Von Willebrand disease Daughter   . Other Daughter        POTS  . Factor V Leiden deficiency Daughter   . Breast cancer Other   . Drug abuse Maternal Aunt   . Diabetes Neg Hx   . Heart attack Neg Hx     Allergies  Allergen Reactions  . Penicillins     REACTION: swelling/dyspnea  . Statins Other (See Comments)    Myalgias on several statins    Medication list has been reviewed and updated.  Current Outpatient Medications on File Prior to Visit  Medication Sig Dispense Refill  . aspirin 81 MG tablet Take 81 mg by mouth daily.      . diclofenac sodium (VOLTAREN) 1 % GEL Apply 2 g topically 4 (four) times daily. To affected joint. 100 g 11  . losartan (COZAAR) 50 MG tablet TAKE 1 TABLET (50 MG TOTAL) BY MOUTH DAILY. 90 tablet 2  . metroNIDAZOLE (METROCREAM) 0.75 % cream Apply 7.32 application topically daily.  5  . SYNTHROID 75 MCG tablet TAKE 1 TABLET (75 MCG TOTAL) BY MOUTH DAILY. 90 tablet 1   No current facility-administered medications on file prior to visit.     Review of Systems:  As per HPI- otherwise negative.   Physical Examination: Vitals:   12/19/17 1305  BP: (!) 156/54  Pulse: 78  Resp: 16  SpO2: 99%   Vitals:   12/19/17 1305  Weight: 152 lb (68.9 kg)  Height: 5\' 5"  (1.651 m)   Body mass index is 25.29 kg/m. Ideal Body Weight: Weight in (lb) to have BMI = 25: 149.9  GEN: WDWN, NAD, Non-toxic, A & O x 3, normal weight  HEENT: Atraumatic, Normocephalic. Neck supple. No masses, No LAD.  Bilateral TM wnl, oropharynx normal.  PEERL,EOMI.  No tenderness of her scalp,  no lesions on skin No carotid bruit Ears and Nose: No external deformity. CV: RRR, No M/G/R. No JVD. No thrill. No extra heart sounds. PULM: CTA B, no wheezes, crackles, rhonchi. No retractions. No resp. distress. No accessory muscle use. ABD: S, NT, ND, +BS. No rebound. No HSM. EXTR: No c/c/e NEURO  Normal gait. Normal romberg Normal sensation and DTR of all extremities Normal strength of all extremities and facial muscles PSYCH: Normally interactive. Conversant. Not depressed or anxious appearing.  Calm demeanor.   BP Readings from Last 3 Encounters:  12/19/17 (!) 156/54  12/09/17 131/68  10/29/17 130/66     Assessment and Plan: Altered consciousness - Plan: CT Head Wo Contrast, Ambulatory referral to Neurology, CANCELED: CT Head Wo Contrast  Contusion of head, unspecified part of head, subsequent encounter  Here today with concern about a strange sensation in the left side of her head, noted intermittently for the last couple of weeks She did have a fall and hit her head back in January,. Spoke with Dr. Krista Blue at [facility] who kindly talked over the case with me.  We will get a CT today to rule- out any acute bleed,  Then plan to follow-up with neurology asap for further evaluation Pt understands plan and states agreement  Signed Lamar Blinks, MD  Dg Elbow Complete Right  Result Date: 12/09/2017 CLINICAL DATA:  Right elbow pain after fall two months ago. EXAM: RIGHT ELBOW - COMPLETE 3+ VIEW COMPARISON:  None. FINDINGS: There is no evidence of fracture, dislocation, or joint effusion. There is no evidence of arthropathy or other focal bone abnormality. Soft tissues are unremarkable. IMPRESSION: Normal right elbow. Electronically Signed   By: Marijo Conception, M.D.   On: 12/09/2017 13:51   Ct Head Wo Contrast  Result Date: 12/19/2017 CLINICAL DATA:  Intermittent headaches since fall 2 months ago. EXAM: CT HEAD WITHOUT CONTRAST TECHNIQUE: Contiguous axial images were obtained from the  base of the skull through the vertex without intravenous contrast. COMPARISON:  None. FINDINGS: Brain: No evidence of acute infarction, hemorrhage, hydrocephalus, extra-axial collection or mass lesion/mass effect. Mild cerebral atrophy. Vascular: No hyperdense vessel or unexpected calcification. Skull: Normal. Negative for fracture or focal lesion. Sinuses/Orbits: No acute finding. Other: None. IMPRESSION: Normal for age noncontrast head CT. Electronically Signed   By: Titus Dubin M.D.   On: 12/19/2017 16:57   Received her CT head results and called pt- negative.  She will await neurology referral and will seek care if any change or worsening in the meantime

## 2017-12-19 NOTE — Patient Instructions (Signed)
Just waiting to get advice from neurology and  I will call you!

## 2017-12-19 NOTE — Telephone Encounter (Signed)
I was able to talk with her primary care physician Dr. Edilia Bo, patient fell on ice in January 2019,  Since then, she had intermittent episode of confusion, with lateralized sensory motor complaints,  I have suggested CT head without contrast and neurology referral,  Please put on schedule to our next available in 1 week,

## 2017-12-25 ENCOUNTER — Other Ambulatory Visit: Payer: Self-pay | Admitting: Family Medicine

## 2018-02-07 ENCOUNTER — Other Ambulatory Visit: Payer: Self-pay | Admitting: Family Medicine

## 2018-02-25 ENCOUNTER — Ambulatory Visit: Payer: BLUE CROSS/BLUE SHIELD | Admitting: Neurology

## 2018-02-25 ENCOUNTER — Encounter: Payer: Self-pay | Admitting: Neurology

## 2018-02-25 ENCOUNTER — Other Ambulatory Visit: Payer: Self-pay

## 2018-02-25 VITALS — BP 138/67 | HR 68 | Resp 18 | Ht 65.0 in | Wt 155.0 lb

## 2018-02-25 DIAGNOSIS — R404 Transient alteration of awareness: Secondary | ICD-10-CM | POA: Diagnosis not present

## 2018-02-25 DIAGNOSIS — W19XXXS Unspecified fall, sequela: Secondary | ICD-10-CM

## 2018-02-25 DIAGNOSIS — W19XXXA Unspecified fall, initial encounter: Secondary | ICD-10-CM | POA: Insufficient documentation

## 2018-02-25 NOTE — Progress Notes (Signed)
PATIENT: Lindsay Hodges DOB: 17-Aug-1956  Chief Complaint  Patient presents with  . Head Injury    Rm. 4.  PCP: Penni Homans.  Hx. of head injury in Jan. 2019.  Slipped on black ice while walking her dogs and hit the back of her head on concrete.  No LOC.  She immed. got up and went about her routine.  Onset in March of intermittent, brief episodes of foggy, numbing sensation left side of head, with feeling like left ear is stopped up.  Believes sx. have improved since onset. CT  head on 12/19/17 was nml for age/fim     HISTORICAL  Lindsay Hodges is a 62 years old female, seen in refer by her primary care physician Dr. Lamar Blinks for evaluation of a brief episode of foggy sensation, initial evaluation was on Feb 25, 2018.  She has past medical history of hypertension, hypothyroidism, on supplement, bilateral internal carotid artery stenosis.  She fell on black ice while walking in January 2019, landed on her occipital regions, she has no loss of consciousness, was able to get up continue walk without any difficulties, next day, she has occipital area headache, bilateral shoulder pain, she has to take it easy for a few days,   In March 2019, while walking in the store, she had sudden onset of dizziness, lightheadedness, left head funny sensation, lasting for few minutes, she has no loss of consciousness, no significant headaches.  In the following few weeks, she has intermittent mild headaches, but there is no limitation on her activities,  CT head without contrast on December 19, 2017 reviewed, no significant abnormality.  Laboratory evaluation in 2019 showed normal negative ESR, vitamin D, TSH, CMP, CBC, A1c 5.9, elevated LDL 140, total cholesterol 226  REVIEW OF SYSTEMS: Full 14 system review of systems performed and notable only for as above  ALLERGIES: Allergies  Allergen Reactions  . Penicillins     REACTION: swelling/dyspnea  . Statins Other (See Comments)    Myalgias on  several statins    HOME MEDICATIONS: Current Outpatient Medications  Medication Sig Dispense Refill  . losartan (COZAAR) 50 MG tablet TAKE 1 TABLET (50 MG TOTAL) BY MOUTH DAILY. 90 tablet 2  . SYNTHROID 75 MCG tablet TAKE 1 TABLET (75 MCG TOTAL) BY MOUTH DAILY. 90 tablet 1  . aspirin 81 MG tablet Take 81 mg by mouth daily.      . diclofenac sodium (VOLTAREN) 1 % GEL Apply 2 g topically 4 (four) times daily. To affected joint. 100 g 11  . metroNIDAZOLE (METROCREAM) 0.75 % cream Apply 9.53 application topically daily.  5   No current facility-administered medications for this visit.     PAST MEDICAL HISTORY: Past Medical History:  Diagnosis Date  . Arthritis   . Bursitis of hip 08/02/15  . Carotid disease, bilateral ([place])    [place] 10/15: R 40-59%, L 50-69%, stable for years  . Elevated sed rate    remote history of myelosuppressive disorder  . Grave's disease    s/p I-131 ablation 1990s  . Hyperlipidemia   . Hypertension   . HYPOTHYROIDISM   . Left knee pain 03/26/2014  . Personal history of colonic adenoma 11/30/2002  . Preventative health care 09/04/2015  . Thrombophlebitis 10/16/2016  . Vitamin D deficiency     PAST SURGICAL HISTORY: Past Surgical History:  Procedure Laterality Date  . Jaw surgery  2001  . OOPHORECTOMY Bilateral   . VAGINAL HYSTERECTOMY  2009   vaginal  hysterectomy    FAMILY HISTORY: Family History  Problem Relation Age of Onset  . Hypertension Mother   . Arthritis Mother   . Hyperlipidemia Mother   . Stroke Mother        1st age 22, then recurrent 96  . Atrial fibrillation Mother   . Epilepsy Mother   . Arthritis Father   . Breast cancer Sister   . Cancer Sister        Recurrent Breast CA  . Atrial fibrillation Brother   . Von Willebrand disease Daughter   . Other Daughter        POTS  . Factor V Leiden deficiency Daughter   . Breast cancer Other   . Drug abuse Maternal Aunt   . Diabetes Neg Hx   . Heart attack Neg Hx     SOCIAL  HISTORY:  Social History   Socioeconomic History  . Marital status: Married    Spouse name: Not on file  . Number of children: Not on file  . Years of education: Not on file  . Highest education level: Not on file  Occupational History    Comment: [employer]  Social Needs  . Financial resource strain: Not on file  . Food insecurity:    Worry: Not on file    Inability: Not on file  . Transportation needs:    Medical: Not on file    Non-medical: Not on file  Tobacco Use  . Smoking status: Former Smoker    Packs/day: 0.50    Years: 12.00    Pack years: 6.00    Last attempt to quit: 02/06/1993    Years since quitting: 25.0  . Smokeless tobacco: Never Used  Substance and Sexual Activity  . Alcohol use: No  . Drug use: No  . Sexual activity: Yes  Lifestyle  . Physical activity:    Days per week: Not on file    Minutes per session: Not on file  . Stress: Not on file  Relationships  . Social connections:    Talks on phone: Not on file    Gets together: Not on file    Attends religious service: Not on file    Active member of club or organization: Not on file    Attends meetings of clubs or organizations: Not on file    Relationship status: Not on file  . Intimate partner violence:    Fear of current or ex partner: Not on file    Emotionally abused: Not on file    Physically abused: Not on file    Forced sexual activity: Not on file  Other Topics Concern  . Not on file  Social History Narrative   pharmacy tech III     PHYSICAL EXAM   Vitals:   02/25/18 0742  BP: 138/67  Pulse: 68  Resp: 18  Weight: 155 lb (70.3 kg)  Height: _0  (1.651 m)    Not recorded      Body mass index is 25.79 kg/m.  PHYSICAL EXAMNIATION:  Gen: NAD, conversant, well nourised, obese, well groomed                     Cardiovascular: Regular rate rhythm, no peripheral edema, warm, nontender. Eyes: Conjunctivae clear without exudates or hemorrhage Neck: Supple, no  carotid bruits. Pulmonary: Clear to auscultation bilaterally   NEUROLOGICAL EXAM:  MENTAL STATUS: Speech:    Speech is normal; fluent and spontaneous with normal comprehension.  Cognition:  Orientation to time, place and person     Normal recent and remote memory     Normal Attention span and concentration     Normal Language, naming, repeating,spontaneous speech     Fund of knowledge   CRANIAL NERVES: CN II: Visual fields are full to confrontation. Fundoscopic exam is normal with sharp discs and no vascular changes. Pupils are round equal and briskly reactive to light. CN III, IV, VI: extraocular movement are normal. No ptosis. CN V: Facial sensation is intact to pinprick in all 3 divisions bilaterally. Corneal responses are intact.  CN VII: Face is symmetric with normal eye closure and smile. CN VIII: Hearing is normal to rubbing fingers CN IX, X: Palate elevates symmetrically. Phonation is normal. CN XI: Head turning and shoulder shrug are intact CN XII: Tongue is midline with normal movements and no atrophy.  MOTOR: There is no pronator drift of out-stretched arms. Muscle bulk and tone are normal. Muscle strength is normal.  REFLEXES: Reflexes are 2+ and symmetric at the biceps, triceps, knees, and ankles. Plantar responses are flexor.  SENSORY: Intact to light touch, pinprick, positional sensation and vibratory sensation are intact in fingers and toes.  COORDINATION: Rapid alternating movements and fine finger movements are intact. There is no dysmetria on finger-to-nose and heel-knee-shin.    GAIT/STANCE: Posture is normal. Gait is steady with normal steps, base, arm swing, and turning. Heel and toe walking are normal. Tandem gait is normal.  Romberg is absent.   DIAGNOSTIC DATA (LABS, IMAGING, TESTING) - I reviewed patient records, labs, notes, testing and imaging myself where available.   ASSESSMENT AND PLAN  Lindsay Hodges is a 62 y.o. female    Fall in  Jan 2019  Normal CT head,  Normal neurological examinations  Continue observe her symptoms,  Marcial Pacas, M.D. Ph.D.  [facility] [address], [place], [place] [address] Ph: [phone] Fax: [phone]  CC: Copland, Gay Filler, MD

## 2018-04-09 NOTE — Progress Notes (Signed)
Lindsay Hodges at [facility] [address] [place], [place] [address] [phone]  Date:  04/14/2018   Name:  Lindsay Hodges   DOB:  08-23-1956   MRN:  [id number]  PCP:  Darreld Mclean, MD    Chief Complaint: Fall (follow up, seen neurology-doing fine) and Medication Management (should pt be taking vit d? )   History of Present Illness:  Lindsay Hodges is a 62 y.o. very pleasant female patient who presents with the following:  Following up today- last seen by myself following a fall in March of this year: Here today with concern about a strange sensation in the left side of her head, noted intermittently for the last couple of weeks She did have a fall and hit her head back in January,. Spoke with Dr. Krista Blue at [facility] who kindly talked over the case with me.  We will get a CT today to rule- out any acute bleed,  Then plan to follow-up with neurology asap for further evaluation   Imaging- head CT- and neurology eval was reassuring at that time  Pt notes that her sx seem to have resolved.  She no longer has any concerns about the symptoms that brought her in back in March. She did have a right elbow pain from her fall as well - she notes that it hurts just if she presses the point of the elbow on something hard like a table.  Not painful with normal elbow use  She is not having much in the way of headaches Her BP was a bit up today She does check her BP at home- it generally runs 128-132/ 60s  BP Readings from Last 3 Encounters:  04/14/18 (!) 142/70  02/25/18 138/67  12/19/17 122/60   She did take her meds this am but did not eat  Lab Results  Component Value Date   TSH 1.25 10/29/2017   She did have a mammogram with [facility] last March  Her sister did have breast cancer and sadly died 2 years ago- I encouraged pt to have an annual mammogram   She has a history of vit D def and was on HD vitamin D but finished this up about a month ago-  she was not sure is she needed to continue to take OTC vitamin D She did have shingrix #1 at [facility]- this is already in chart  Patient Active Problem List   Diagnosis Date Noted  . Fall 02/25/2018  . Elevated sed rate   . Cervical cancer screening 03/01/2017  . Hyperglycemia 10/16/2016  . Thrombophlebitis 10/16/2016  . Plantar fasciitis, left 01/10/2016  . Preventative health care 09/04/2015  . Sun-damaged skin 09/04/2015  . Bursitis of hip 08/02/2015  . Low back pain radiating to both legs 03/30/2015  . UTI symptoms 03/16/2015  . Trochanteric bursitis of right hip 02/14/2015  . Left knee pain 03/26/2014  . Seasonal affective disorder ([place]) 11/25/2012  . Vitamin D deficiency 02/14/2012  . Hyperlipidemia   . Hypertension   . Graves' disease   . Arthritis   . Carotid disease, bilateral ([place])   . CAROTID BRUIT, LEFT 04/11/2009  . Hypothyroidism 01/06/2009  . Personal history of colonic adenoma 11/30/2002    Past Medical History:  Diagnosis Date  . Arthritis   . Bursitis of hip 08/02/15  . Carotid disease, bilateral ([place])    [place] 10/15: R 40-59%, L 50-69%, stable for years  . Elevated sed rate  remote history of myelosuppressive disorder  . Grave's disease    s/p I-131 ablation 1990s  . Hyperlipidemia   . Hypertension   . HYPOTHYROIDISM   . Left knee pain 03/26/2014  . Personal history of colonic adenoma 11/30/2002  . Preventative health care 09/04/2015  . Thrombophlebitis 10/16/2016  . Vitamin D deficiency     Past Surgical History:  Procedure Laterality Date  . Jaw surgery  2001  . OOPHORECTOMY Bilateral   . VAGINAL HYSTERECTOMY  2009   vaginal hysterectomy    Social History   Tobacco Use  . Smoking status: Former Smoker    Packs/day: 0.50    Years: 12.00    Pack years: 6.00    Last attempt to quit: 02/06/1993    Years since quitting: 25.2  . Smokeless tobacco: Never Used  Substance Use Topics  . Alcohol use: No  . Drug use: No    Family History  Problem  Relation Age of Onset  . Hypertension Mother   . Arthritis Mother   . Hyperlipidemia Mother   . Stroke Mother        1st age 39, then recurrent 78  . Atrial fibrillation Mother   . Epilepsy Mother   . Arthritis Father   . Breast cancer Sister   . Cancer Sister        Recurrent Breast CA  . Atrial fibrillation Brother   . Von Willebrand disease Daughter   . Other Daughter        POTS  . Factor V Leiden deficiency Daughter   . Breast cancer Other   . Drug abuse Maternal Aunt   . Diabetes Neg Hx   . Heart attack Neg Hx     Allergies  Allergen Reactions  . Penicillins     REACTION: swelling/dyspnea  . Statins Other (See Comments)    Myalgias on several statins    Medication list has been reviewed and updated.  Current Outpatient Medications on File Prior to Visit  Medication Sig Dispense Refill  . losartan (COZAAR) 50 MG tablet TAKE 1 TABLET (50 MG TOTAL) BY MOUTH DAILY. 90 tablet 2  . metroNIDAZOLE (METROCREAM) 0.75 % cream Apply 2.84 application topically daily.  5  . SYNTHROID 75 MCG tablet TAKE 1 TABLET (75 MCG TOTAL) BY MOUTH DAILY. 90 tablet 1   No current facility-administered medications on file prior to visit.     Review of Systems:  As per HPI- otherwise negative. No fever or chills No CP or SOB  Physical Examination: Vitals:   04/14/18 0908  BP: (!) 142/70  Pulse: 72  Resp: 16  SpO2: 99%   Vitals:   04/14/18 0908  Weight: 154 lb (69.9 kg)  Height: 5\' 5"  (1.651 m)   Body mass index is 25.63 kg/m. Ideal Body Weight: Weight in (lb) to have BMI = 25: 149.9  GEN: WDWN, NAD, Non-toxic, A & O x 3, looks well, normal weight HEENT: Atraumatic, Normocephalic. Neck supple. No masses, No LAD.  Bilateral TM wnl, oropharynx normal.  PEERL,EOMI.   Ears and Nose: No external deformity. CV: RRR, No M/G/R. No JVD. No thrill. No extra heart sounds. PULM: CTA B, no wheezes, crackles, rhonchi. No retractions. No resp. distress. No accessory muscle use. ABD: S,  NT, ND EXTR: No c/c/e NEURO Normal gait.  Normal strength, sensation and DTR of all extremities, negative romberg PSYCH: Normally interactive. Conversant. Not depressed or anxious appearing.  Calm demeanor.  She has a small, subcue cyst over the  point of the right elbow which seems to be the source of her tenderness, otherwise elbow exam is normal  Assessment and Plan: Essential hypertension  Vitamin D deficiency - Plan: Vitamin D (25 hydroxy)  Hypothyroidism, unspecified type - Plan: TSH  Altered consciousness  Following up today Prior symptoms are resolved BP control is ok overall- she will continue to monitor at home Will check on her TSH and vitamin D for her today- Will plan further follow- up pending labs.   Signed Lamar Blinks, MD  Received labs, message to pt  Results for orders placed or performed in visit on 04/14/18  TSH  Result Value Ref Range   TSH 1.68 0.35 - 4.50 uIU/mL  Vitamin D (25 hydroxy)  Result Value Ref Range   VITD 31.61 30.00 - 100.00 ng/mL

## 2018-04-12 ENCOUNTER — Other Ambulatory Visit: Payer: Self-pay | Admitting: Family Medicine

## 2018-04-14 ENCOUNTER — Ambulatory Visit: Payer: BLUE CROSS/BLUE SHIELD | Admitting: Family Medicine

## 2018-04-14 ENCOUNTER — Encounter: Payer: Self-pay | Admitting: Family Medicine

## 2018-04-14 VITALS — BP 142/70 | HR 72 | Resp 16 | Ht 65.0 in | Wt 154.0 lb

## 2018-04-14 DIAGNOSIS — I10 Essential (primary) hypertension: Secondary | ICD-10-CM | POA: Diagnosis not present

## 2018-04-14 DIAGNOSIS — E039 Hypothyroidism, unspecified: Secondary | ICD-10-CM | POA: Diagnosis not present

## 2018-04-14 DIAGNOSIS — R404 Transient alteration of awareness: Secondary | ICD-10-CM | POA: Diagnosis not present

## 2018-04-14 DIAGNOSIS — E559 Vitamin D deficiency, unspecified: Secondary | ICD-10-CM

## 2018-04-14 LAB — VITAMIN D 25 HYDROXY (VIT D DEFICIENCY, FRACTURES): VITD: 31.61 ng/mL (ref 30.00–100.00)

## 2018-04-14 LAB — TSH: TSH: 1.68 u[IU]/mL (ref 0.35–4.50)

## 2018-04-14 NOTE — Patient Instructions (Signed)
Great to see you again today- I will be in touch with your labs asap  Please ask [facility] to send up a copy of your next mammogram- I would recommend that you continue annual mammo given your sister's history   Please do let me know if you have any more of the strange symptoms that brought you in back in March

## 2018-04-29 ENCOUNTER — Ambulatory Visit: Payer: BLUE CROSS/BLUE SHIELD | Admitting: Family Medicine

## 2018-05-27 ENCOUNTER — Encounter: Payer: Self-pay | Admitting: Internal Medicine

## 2018-06-23 ENCOUNTER — Telehealth: Payer: Self-pay | Admitting: Cardiology

## 2018-06-23 NOTE — Telephone Encounter (Signed)
Left message for pt to call.

## 2018-06-23 NOTE — Telephone Encounter (Signed)
Follow Up: ° ° °Patient is returning a call  °

## 2018-06-23 NOTE — Telephone Encounter (Signed)
Spoke with pt. Pt sts that she is not having chest pain and that there may have been some sort of miscommunication in the message.  Pt sts that she is doing well and was calling to schedule  her yearly appt for Jan 2020 with Dr.Crenshaw. Pt sts that she was told that Dr.Crenshaw's Jan 2020 schedule was not yet available.  Pt sts that she was instructed to call back in a month to schedule the appt.

## 2018-06-23 NOTE — Telephone Encounter (Signed)
Pt called c/o chest pains and wants to be worked in soon

## 2018-06-25 ENCOUNTER — Other Ambulatory Visit: Payer: Self-pay | Admitting: Family Medicine

## 2018-07-17 ENCOUNTER — Other Ambulatory Visit: Payer: Self-pay

## 2018-07-17 ENCOUNTER — Ambulatory Visit (AMBULATORY_SURGERY_CENTER): Payer: Self-pay

## 2018-07-17 ENCOUNTER — Encounter: Payer: Self-pay | Admitting: Internal Medicine

## 2018-07-17 VITALS — Ht 65.0 in | Wt 153.6 lb

## 2018-07-17 DIAGNOSIS — Z86010 Personal history of colonic polyps: Secondary | ICD-10-CM

## 2018-07-17 NOTE — Progress Notes (Signed)
No egg or soy allergy known to patient  No issues with past sedation with any surgeries  or procedures, no intubation problems  No diet pills per patient No home 02 use per patient  No blood thinners per patient  Pt denies issues with constipation  No A fib or A flutter  EMMI video sent to pt's e mail , pt declined    

## 2018-07-31 ENCOUNTER — Encounter: Payer: Self-pay | Admitting: Internal Medicine

## 2018-07-31 ENCOUNTER — Ambulatory Visit (AMBULATORY_SURGERY_CENTER): Payer: BLUE CROSS/BLUE SHIELD | Admitting: Internal Medicine

## 2018-07-31 VITALS — BP 116/45 | HR 66 | Temp 99.1°F | Resp 14 | Ht 65.0 in | Wt 153.0 lb

## 2018-07-31 DIAGNOSIS — D122 Benign neoplasm of ascending colon: Secondary | ICD-10-CM | POA: Diagnosis not present

## 2018-07-31 DIAGNOSIS — Z86010 Personal history of colonic polyps: Secondary | ICD-10-CM

## 2018-07-31 MED ORDER — SODIUM CHLORIDE 0.9 % IV SOLN: 500.0000 mL | Freq: Once | INTRAVENOUS | Status: DC

## 2018-07-31 NOTE — Op Note (Signed)
[facility] Patient Name: Lindsay Hodges Procedure Date: 07/31/2018 8:40 AM MRN: [id number] Endoscopist: Gatha Mayer , MD Age: 62 Referring MD:  Date of Birth: [birth year] Gender: Female Account #: [id number] Procedure:                Colonoscopy Indications:              Surveillance: Personal history of adenomatous                            polyps on last colonoscopy > 5 years ago Medicines:                Propofol per Anesthesia, Monitored Anesthesia Care Procedure:                Pre-Anesthesia Assessment:                           - Prior to the procedure, a History and Physical                            was performed, and patient medications and                            allergies were reviewed. The patient's tolerance of                            previous anesthesia was also reviewed. The risks                            and benefits of the procedure and the sedation                            options and risks were discussed with the patient.                            All questions were answered, and informed consent                            was obtained. Prior Anticoagulants: The patient has                            taken no previous anticoagulant or antiplatelet                            agents. ASA Grade Assessment: III - A patient with                            severe systemic disease. After reviewing the risks                            and benefits, the patient was deemed in                            satisfactory condition to undergo the procedure.  After obtaining informed consent, the colonoscope                            was passed under direct vision. Throughout the                            procedure, the patient's blood pressure, pulse, and                            oxygen saturations were monitored continuously. The                            Model PCF-H190DL [id number]) scope was introduced          through the anus and advanced to the the cecum,                            identified by appendiceal orifice and ileocecal                            valve. The colonoscopy was performed without                            difficulty. The patient tolerated the procedure                            well. The quality of the bowel preparation was                            excellent. The ileocecal valve, appendiceal                            orifice, and rectum were photographed. The bowel                            preparation used was Miralax. Scope In: 8:43:21 AM Scope Out: 8:25:05 AM Scope Withdrawal Time: 0 hours 9 minutes 48 seconds  Total Procedure Duration: 0 hours 13 minutes 11 seconds  Findings:                 The perianal and digital rectal examinations were                            normal.                           A diminutive polyp was found in the ascending                            colon. The polyp was sessile. The polyp was removed                            with a cold snare. Resection and retrieval were  complete. Verification of patient identification                            for the specimen was done. Estimated blood loss was                            minimal.                           Diverticula were found in the sigmoid colon.                           The exam was otherwise without abnormality on                            direct and retroflexion views. Complications:            No immediate complications. Estimated Blood Loss:     Estimated blood loss was minimal. Impression:               - One diminutive polyp in the ascending colon,                            removed with a cold snare. Resected and retrieved.                           - Diverticulosis in the sigmoid colon.                           - The examination was otherwise normal on direct                            and retroflexion views.                            - Personal history of colonic polyp - diminutive                            adenoma 2004, no polyps 2009. Recommendation:           - Patient has a contact number available for                            emergencies. The signs and symptoms of potential                            delayed complications were discussed with the                            patient. Return to normal activities tomorrow.                            Written discharge instructions were provided to the                            patient.                           -  Resume previous diet.                           - Continue present medications.                           - Repeat colonoscopy is recommended for                            surveillance. The colonoscopy date will be                            determined after pathology results from today's                            exam become available for review. Gatha Mayer, MD 07/31/2018 9:07:20 AM This report has been signed electronically.

## 2018-07-31 NOTE — Progress Notes (Signed)
Called to room to assist during endoscopic procedure.  Patient ID and intended procedure confirmed with present staff. Received instructions for my participation in the procedure from the performing physician.  

## 2018-07-31 NOTE — Progress Notes (Signed)
Pt's states no medical or surgical changes since previsit or office visit. 

## 2018-07-31 NOTE — Progress Notes (Signed)
PT taken to PACU. Monitors in place. VSS. Report given to RN. 

## 2018-07-31 NOTE — Patient Instructions (Addendum)
I found and removed one tiny polyp.  You also have a condition called diverticulosis - common and not usually a problem. Please read the handout provided.  I will let you know pathology results and when to have another routine colonoscopy by mail and/or My Chart.  I appreciate the opportunity to care for you. Gatha Mayer, MD, [facility]   INFORMATION ON POLYPS AND DIVERTICULOSIS GI VEN TO YOU TODAY  AWAIT PATHOLOGY RESULT FROM DR Carlean Purl   YOU HAD AN ENDOSCOPIC PROCEDURE TODAY AT [facility]:   Refer to the procedure report that was given to you for any specific questions about what was found during the examination.  If the procedure report does not answer your questions, please call your gastroenterologist to clarify.  If you requested that your care partner not be given the details of your procedure findings, then the procedure report has been included in a sealed envelope for you to review at your convenience later.  YOU SHOULD EXPECT: Some feelings of bloating in the abdomen. Passage of more gas than usual.  Walking can help get rid of the air that was put into your GI tract during the procedure and reduce the bloating. If you had a lower endoscopy (such as a colonoscopy or flexible sigmoidoscopy) you may notice spotting of blood in your stool or on the toilet paper. If you underwent a bowel prep for your procedure, you may not have a normal bowel movement for a few days.  Please Note:  You might notice some irritation and congestion in your nose or some drainage.  This is from the oxygen used during your procedure.  There is no need for concern and it should clear up in a day or so.  SYMPTOMS TO REPORT IMMEDIATELY:   Following lower endoscopy (colonoscopy or flexible sigmoidoscopy):  Excessive amounts of blood in the stool  Significant tenderness or worsening of abdominal pains  Swelling of the abdomen that is new, acute  Fever of 100F or higher    For  urgent or emergent issues, a gastroenterologist can be reached at any hour by calling [phone].   DIET:  We do recommend a small meal at first, but then you may proceed to your regular diet.  Drink plenty of fluids but you should avoid alcoholic beverages for 24 hours.  ACTIVITY:  You should plan to take it easy for the rest of today and you should NOT DRIVE or use heavy machinery until tomorrow (because of the sedation medicines used during the test).    FOLLOW UP: Our staff will call the number listed on your records the next business day following your procedure to check on you and address any questions or concerns that you may have regarding the information given to you following your procedure. If we do not reach you, we will leave a message.  However, if you are feeling well and you are not experiencing any problems, there is no need to return our call.  We will assume that you have returned to your regular daily activities without incident.  If any biopsies were taken you will be contacted by phone or by letter within the next 1-3 weeks.  Please call us at [phone] if you have not heard about the biopsies in 3 weeks.    SIGNATURES/CONFIDENTIALITY: You and/or your care partner have signed paperwork which will be entered into your electronic medical record.  These signatures attest to the fact that that the information  above on your After Visit Summary has been reviewed and is understood.  Full responsibility of the confidentiality of this discharge information lies with you and/or your care-partner. 

## 2018-08-01 ENCOUNTER — Telehealth: Payer: Self-pay | Admitting: *Deleted

## 2018-08-01 NOTE — Telephone Encounter (Signed)
  Follow up Call-  Call back number 07/31/2018  Post procedure Call Back phone  # [phone]  Permission to leave phone message Yes  Some recent data might be hidden     Patient questions:  Do you have a fever, pain , or abdominal swelling? No. Pain Score  0 *  Have you tolerated food without any problems? Yes.    Have you been able to return to your normal activities? yes  Do you have any questions about your discharge instructions: Diet   No. Medications  No. Follow up visit  No.  Do you have questions or concerns about your Care? No.  Actions: * If pain score is 4 or above: No action needed, pain <4.

## 2018-08-06 ENCOUNTER — Encounter: Payer: Self-pay | Admitting: Internal Medicine

## 2018-08-06 NOTE — Progress Notes (Signed)
Diminutive adenoma Recall 2024 My Chart

## 2018-10-02 ENCOUNTER — Ambulatory Visit: Payer: BLUE CROSS/BLUE SHIELD | Admitting: Sports Medicine

## 2018-10-02 ENCOUNTER — Emergency Department
Admission: EM | Admit: 2018-10-02 | Discharge: 2018-10-02 | Disposition: A | Discharge status: Home / Self Care | Payer: BLUE CROSS/BLUE SHIELD | Source: Home / Self Care

## 2018-10-02 ENCOUNTER — Encounter: Payer: Self-pay | Admitting: Sports Medicine

## 2018-10-02 DIAGNOSIS — I639 Cerebral infarction, unspecified: Secondary | ICD-10-CM | POA: Diagnosis not present

## 2018-10-02 DIAGNOSIS — R200 Anesthesia of skin: Secondary | ICD-10-CM

## 2018-10-02 MED ORDER — LOSARTAN POTASSIUM-HCTZ 50-12.5 MG PO TABS: 1.0000 | ORAL_TABLET | Freq: Two times a day (BID) | ORAL | 3 refills | Status: DC

## 2018-10-02 MED ORDER — DIAZEPAM 5 MG PO TABS: ORAL_TABLET | ORAL | 0 refills | Status: DC

## 2018-10-02 NOTE — Addendum Note (Signed)
Addended by: Silverio Decamp on: 10/02/2018 12:33 PM   Modules accepted: Orders

## 2018-10-02 NOTE — Assessment & Plan Note (Addendum)
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 Elevated blood pressure, left-sided facial tingling, left-sided arm weakness. Symptoms are stable, and started a week ago so she is out of the TPA. We need to aggressively control her blood pressure, losartan has a duration of about 12 hours so we will increase this to twice daily, females tend to have a better mortality reduction with using ARB/HCTZ combo. We will switch to losartan/HCTZ low-dose. MRI today. Carotid Dopplers. Once we determined that there is no hemorrhagic component we will add an aspirin, and aggressive reduction of lipids with a statin. Return to see me either tomorrow or early next week. Valium for preprocedural anxiolysis.  MRI shows no evidence of acute stroke, she does have some chronic white matter changes nonspecific but this can be seen in migraines and TIAs. I think ultimately we treat this is both, aggressive control of her blood pressure, lipids. She will keep a headache diary as complex migraines can present this way as well. Follow-up with PCP.

## 2018-10-02 NOTE — Progress Notes (Addendum)
Subjective:    CC: Left-sided facial numbness and tingling  HPI: This is a pleasant 63 year old female, she is a history of left carotid stenosis, mild, asymptomatic.  For the past week she has had a mild left-sided headache, followed by numbness and tingling in her left eyebrow proceeding to the left side of the cheek and face.  She thought it was a sinus infection.  Today she started to develop some weakness in her left arm and hand.  Nothing in the right side.  No dysphasia.  She is here for further evaluation and treatment.  No head trauma, no visual changes, no constitutional symptoms.  I reviewed the past medical history, family history, social history, surgical history, and allergies today and no changes were needed.  Please see the problem list section below in epic for further details.  Past Medical History: Past Medical History:  Diagnosis Date  . Arthritis   . Bursitis of hip 08/02/15  . Carotid disease, bilateral ([place])    [place] 10/15: R 40-59%, L 50-69%, stable for years  . Elevated sed rate    remote history of myelosuppressive disorder  . Grave's disease    s/p I-131 ablation 1990s  . Hyperlipidemia   . Hypertension   . HYPOTHYROIDISM   . Left knee pain 03/26/2014  . Personal history of colonic adenoma 11/30/2002  . Preventative health care 09/04/2015  . Thrombophlebitis 10/16/2016  . Vitamin D deficiency    Past Surgical History: Past Surgical History:  Procedure Laterality Date  . COLONOSCOPY    . Jaw surgery  2001  . OOPHORECTOMY Bilateral   . POLYPECTOMY    . VAGINAL HYSTERECTOMY  2009   vaginal hysterectomy   Social History: Social History   Socioeconomic History  . Marital status: Married    Spouse name: Not on file  . Number of children: Not on file  . Years of education: Not on file  . Highest education level: Not on file  Occupational History    Comment: [employer]  Social Needs  . Financial resource strain: Not on file  . Food  insecurity:    Worry: Not on file    Inability: Not on file  . Transportation needs:    Medical: Not on file    Non-medical: Not on file  Tobacco Use  . Smoking status: Former Smoker    Packs/day: 0.50    Years: 12.00    Pack years: 6.00    Last attempt to quit: 02/06/1993    Years since quitting: 25.6  . Smokeless tobacco: Never Used  Substance and Sexual Activity  . Alcohol use: No  . Drug use: No  . Sexual activity: Yes  Lifestyle  . Physical activity:    Days per week: Not on file    Minutes per session: Not on file  . Stress: Not on file  Relationships  . Social connections:    Talks on phone: Not on file    Gets together: Not on file    Attends religious service: Not on file    Active member of club or organization: Not on file    Attends meetings of clubs or organizations: Not on file    Relationship status: Not on file  Other Topics Concern  . Not on file  Social History Narrative   pharmacy tech III   Family History: Family History  Problem Relation Age of Onset  . Hypertension Mother   . Arthritis Mother   . Hyperlipidemia Mother   .  Stroke Mother        1st age 92, then recurrent 66  . Atrial fibrillation Mother   . Epilepsy Mother   . Arthritis Father   . Breast cancer Sister   . Cancer Sister        Recurrent Breast CA  . Atrial fibrillation Brother   . Von Willebrand disease Daughter   . Other Daughter        POTS  . Factor V Leiden deficiency Daughter   . Breast cancer Other   . Drug abuse Maternal Aunt   . Diabetes Neg Hx   . Heart attack Neg Hx   . Colon cancer Neg Hx   . Esophageal cancer Neg Hx   . Rectal cancer Neg Hx   . Stomach cancer Neg Hx    Allergies: Allergies  Allergen Reactions  . Penicillins     REACTION: swelling/dyspnea  . Statins Other (See Comments)    Myalgias on several statins   Medications: See med rec.  Review of Systems: No fevers, chills, night sweats, weight loss, chest pain, or shortness of breath.    Objective:    General: Well Developed, well nourished, and in no acute distress.  Neuro: Alert and oriented x3, extra-ocular muscles intact, sensation grossly intact.  Cranial nerves II through XII are intact, motor, sensory functions are intact, she does have mild dysdiadochokinesis on the left upper extremity with rapid alternating motions.  Negative Romberg sign, no pronator drift.  Reflexes are intact and 2+ in both upper and lower extremities. HEENT: Normocephalic, atraumatic, pupils equal round reactive to light, neck supple, no masses, no lymphadenopathy, thyroid nonpalpable.  Skin: Warm and dry, no rashes. Cardiac: Regular rate and rhythm, no murmurs rubs or gallops, no lower extremity edema.  Respiratory: Clear to auscultation bilaterally. Not using accessory muscles, speaking in full sentences.  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 Elevated blood pressure, left-sided facial tingling, left-sided arm weakness. Symptoms are stable, and started a week ago so she is out of the TPA. We need to aggressively control her blood pressure, losartan has a duration of about 12 hours so we will increase this to twice daily, females tend to have a better mortality reduction with using ARB/HCTZ combo. We will switch to losartan/HCTZ low-dose. MRI today. Carotid Dopplers. Once we determined that there is no hemorrhagic component we will add an aspirin, and aggressive reduction of lipids with a statin. Return to see me either tomorrow or early next week. Valium for preprocedural anxiolysis.  MRI shows no evidence of acute stroke, she does have some chronic white matter changes nonspecific but this can be seen in migraines and TIAs. I think ultimately we treat this is both, aggressive control of her blood pressure, lipids. She will keep a headache diary as complex migraines can present this way as well. Follow-up with PCP.  I spent 40 minutes  with this patient, greater than 50% was face-to-face time counseling regarding the above diagnoses, specifically discussing treatment options, my diagnostic suspicions and setting of imaging. ___________________________________________ Gwen Her. Dianah Field, M.D., ABFM., CAQSM. Primary Care and Sports Medicine [facility]  Adjunct Professor of [employer]

## 2018-10-06 ENCOUNTER — Ambulatory Visit: Payer: BLUE CROSS/BLUE SHIELD | Admitting: Sports Medicine

## 2018-10-07 ENCOUNTER — Ambulatory Visit: Payer: Self-pay

## 2018-10-07 ENCOUNTER — Telehealth: Payer: Self-pay

## 2018-10-07 NOTE — Telephone Encounter (Signed)
Pt called to report that she was seen at [facility] 10/02/18. She was having symptoms prior to this visit of twitching to her left eye, left cheek tingling and then left arm heaviness. She states the doctor there did an MRI for her symptoms but the results were negative.  She states they told her that  her BP was elevated.  Her discharge papers say that she was 152/70"s. The doctor changed her medication to Losartan/HCTZ twice a day.  She has been taking her BP regularly 130/57 today.  HR has been 50-60's. She states she is just feeling fatigued and weak all over.  She states her eye twitch has returned. Appointment scheduled per protocol with Mackie Pai. Care advice read to patient.  Pt verbalized understanding of all instructions.  Reason for Disposition . Taking a medicine that could cause weakness (e.g., blood pressure medications, diuretics)  Answer Assessment - Initial Assessment Questions 1. DESCRIPTION: "Describe how you are feeling."     Weak just not good 2. SEVERITY: "How bad is it?"  "Can you stand and walk?"   - MILD - Feels weak or tired, but does not interfere with work, school or normal activities   - [place] to stand and walk; weakness interferes with work, school, or normal activities   - SEVERE - Unable to stand or walk     mild 3. ONSET:  "When did the weakness begin?"     When starting losartan/HCTZ 4. CAUSE: "What do you think is causing the weakness?"     Medication change 5. MEDICINES: "Have you recently started a new medicine or had a change in the amount of a medicine?"     Losartan/HCTZ 6. OTHER SYMPTOMS: "Do you have any other symptoms?" (e.g., chest pain, fever, cough, SOB, vomiting, diarrhea, bleeding, other areas of pain)     no 7. PREGNANCY: "Is there any chance you are pregnant?" "When was your last menstrual period?"     N/A hysterectomy  Protocols used: WEAKNESS (GENERALIZED) AND FATIGUE-A-AH

## 2018-10-07 NOTE — Telephone Encounter (Signed)
Copied from [facility] [id number]. Topic: General - Inquiry >> Oct 06, 2018  7:55 AM Conception Chancy, NT wrote: Reason for CRM: patient is calling and states she was seen at the Urgent Care in [place] in regards to her blood pressure and her blood pressure medication was changed.  She would like to speak with Dr. Charlett Blake or her nurse about these changes and to see if she needs to be seen.   Patient will see Percell Miller on tomorrow. She feels it is best to see someone because she feels this tingling in her head again

## 2018-10-08 ENCOUNTER — Ambulatory Visit: Payer: BLUE CROSS/BLUE SHIELD | Admitting: Medical

## 2018-10-08 ENCOUNTER — Encounter: Payer: Self-pay | Admitting: Medical

## 2018-10-08 VITALS — BP 140/70 | HR 66 | Temp 98.2°F | Resp 16 | Ht 65.0 in | Wt 150.4 lb

## 2018-10-08 DIAGNOSIS — I10 Essential (primary) hypertension: Secondary | ICD-10-CM | POA: Diagnosis not present

## 2018-10-08 DIAGNOSIS — R5383 Other fatigue: Secondary | ICD-10-CM | POA: Diagnosis not present

## 2018-10-08 DIAGNOSIS — G459 Transient cerebral ischemic attack, unspecified: Secondary | ICD-10-CM | POA: Diagnosis not present

## 2018-10-08 DIAGNOSIS — E782 Mixed hyperlipidemia: Secondary | ICD-10-CM | POA: Diagnosis not present

## 2018-10-08 LAB — POC URINALSYSI DIPSTICK (AUTOMATED)
Bilirubin, UA: NEGATIVE
Blood, UA: NEGATIVE
Glucose, UA: NEGATIVE
Ketones, UA: NEGATIVE
Leukocytes, UA: NEGATIVE
Nitrite, UA: NEGATIVE
Protein, UA: NEGATIVE
Spec Grav, UA: 1.025 (ref 1.010–1.025)
Urobilinogen, UA: NEGATIVE E.U./dL — AB
pH, UA: 6 (ref 5.0–8.0)

## 2018-10-08 MED ORDER — CHLORTHALIDONE 25 MG PO TABS: 25.0000 mg | ORAL_TABLET | Freq: Every day | ORAL | 11 refills | Status: DC

## 2018-10-08 MED ORDER — LOSARTAN POTASSIUM 100 MG PO TABS: 100.0000 mg | ORAL_TABLET | Freq: Every day | ORAL | 3 refills | Status: DC

## 2018-10-08 NOTE — Progress Notes (Signed)
Subjective:    Patient ID: Lindsay Hodges, female    DOB: [birth year], 63 y.o.   MRN: [id number]  HPI  Pt in today stating she doe not feel great but does not feel right.  Pt summarizes that last week she had twitching of there area above her left eye with mild ha. Also left arm felt heavy.(these symptoms were occurring from around Sep 24, 2018 until around Oct 02, 2017). Pt states she did not have any problems moving her arm.  Pt saw MD on October 02, 2017. She had CT done  Pt has felt fatigue recently. She tried to row for about one hour yesterday. She had to stopped within 5 minutes. Her CT of head was negative. No chest pain. No sob.  Pt has MRI order that was placed recently. Pt also has [place] ordered. Pt results showed.  Result Impression  IMPRESSION: 1. No acute intracranial abnormality. 2. Few small discrete white matter lesions are identified. These lesions are abnormal but nonspecific, usually resulting from benign/remote/incidental causes (e.g. prior trauma/inflammation/demyelinization, or chronic ischemia associated with  migraines/atherosclerosis/other vasculopathies).      She sees her cardiologist on 22nd.  Pt does have high cholesterol. But side effects with statin.   Review of Systems  Constitutional: Positive for fatigue.  Respiratory: Negative for chest tightness and wheezing.   Genitourinary: Negative for dysuria, flank pain, frequency, urgency, vaginal bleeding and vaginal pain.  Skin: Negative for color change and wound.  Neurological: Negative for dizziness, seizures, weakness, numbness and headaches.       See hpi. See cpe.  Hematological: Negative for adenopathy. Does not bruise/bleed easily.  Psychiatric/Behavioral: Negative for behavioral problems, confusion, dysphoric mood, self-injury, sleep disturbance and suicidal ideas. The patient is not hyperactive.        Objective:   Physical Exam  General Mental Status- Alert. General Appearance- Not  in acute distress.   Skin General: Color- Normal Color. Moisture- Normal Moisture.  Neck Carotid Arteries- Normal color. Moisture- Normal Moisture. No carotid bruits. No JVD.  Chest and Lung Exam Auscultation: Breath Sounds:-Normal.  Cardiovascular Auscultation:Rythm- Regular. Murmurs & Other Heart Sounds:Auscultation of the heart reveals- No Murmurs.  Abdomen Inspection:-Inspeection Normal. Palpation/Percussion:Note:No mass. Palpation and Percussion of the abdomen reveal- Non Tender, Non Distended + BS, no rebound or guarding.   General Mental Status- Alert. General Appearance- Not in acute distress.   Skin General: Color- Normal Color. Moisture- Normal Moisture.  Neck Carotid Arteries- Normal color. Moisture- Normal Moisture. No carotid bruits. No JVD.  Chest and Lung Exam Auscultation: Breath Sounds:-Normal.  Cardiovascular Auscultation:Rythm- Regular. Murmurs & Other Heart Sounds:Auscultation of the heart reveals- No Murmurs.  Abdomen Inspection:-Inspeection Normal. Palpation/Percussion:Note:No mass. Palpation and Percussion of the abdomen reveal- Non Tender, Non Distended + BS, no rebound or guarding.   Neurologic Cranial Nerve exam:- CN III-XII intact(No nystagmus), symmetric smile. Drift Test:- No drift. .Finger to Nose:- Normal/Intact Strength:- 5/5 equal and symmetric strength both upper and lower extremities.      Assessment & Plan:  Your blood pressure was 140 over standing when I checked it.  We had discussion about the losartan and HCTZ.  You could use losartan 100 mg a day and chlorthalidone 25 mg.  Stop HCTZ.  I gave you a print prescription of the losartan 100 mg tablets.  If too expensive then would temporarily take 2 tablets of the losartan together.  Check your blood pressure daily when you are relaxed.  Ideal blood pressure would be around 130/80.  Make  sure that you stay well-hydrated and eat potassium rich foods.  We will see if you have any  recurrent symptoms as before.  If you have symptoms that persist then you would need emergency department evaluation again for possible reimaging.  If you have transient twitching of facial muscles or transient pain then might need to refer you to neurologist to work-up differential diagnosis.  You have history of high cholesterol and in light of recent possible neurologic symptoms and known carotid artery disease, you might touch base with cardiologist about medication called Repatha.  For fatigue will get CBC, CMP, B12, B1 and vitamin D.  Also will run your urine.  If urine has any possible indicators of infection we will get a urine culture.  Follow-up in 10 to 14 days for blood pressure check.  40 minutes spent with pt 50% of time spent counseling pt on plan gong forward and work today.  Mackie Pai, PA-C

## 2018-10-08 NOTE — Patient Instructions (Signed)
Your blood pressure was 140 over standing when I checked it.  We had discussion about the losartan and HCTZ.  You could use losartan 100 mg a day and chlorthalidone 25 mg.  Stop HCTZ.  I gave you a print prescription of the losartan 100 mg tablets.  If too expensive then would temporarily take 2 tablets of the losartan together.  Check your blood pressure daily when you are relaxed.  Ideal blood pressure would be around 130/80.  Make sure that you stay well-hydrated and eat potassium rich foods.  We will see if you have any recurrent symptoms as before.  If you have symptoms that persist then you would need emergency department evaluation again for possible reimaging.  If you have transient twitching of facial muscles or transient pain then might need to refer you to neurologist to work-up differential diagnosis.  You have history of high cholesterol and in light of recent possible neurologic symptoms and known carotid artery disease, you might touch base with cardiologist about medication called Repatha.  For fatigue will get CBC, CMP, B12, B1 and vitamin D.  Also will run your urine.  If urine has any possible indicators of infection we will get a urine culture.  Follow-up in 10 to 14 days for blood pressure check.

## 2018-10-09 LAB — COMPREHENSIVE METABOLIC PANEL
ALT: 45 U/L — AB (ref 0–35)
AST: 37 U/L (ref 0–37)
Albumin: 4.4 g/dL (ref 3.5–5.2)
Alkaline Phosphatase: 85 U/L (ref 39–117)
BILIRUBIN TOTAL: 0.4 mg/dL (ref 0.2–1.2)
BUN: 19 mg/dL (ref 6–23)
CO2: 30 meq/L (ref 19–32)
Calcium: 10 mg/dL (ref 8.4–10.5)
Chloride: 99 mEq/L (ref 96–112)
Creatinine, Ser: 0.76 mg/dL (ref 0.40–1.20)
GFR: 81.23 mL/min (ref >60.00)
GLUCOSE: 95 mg/dL (ref 70–99)
POTASSIUM: 3.8 meq/L (ref 3.5–5.1)
Sodium: 137 mEq/L (ref 135–145)
Total Protein: 7.3 g/dL (ref 6.0–8.3)

## 2018-10-09 LAB — CBC WITH DIFFERENTIAL/PLATELET
Basophils Absolute: 0.1 10*3/uL (ref 0.0–0.1)
Basophils Relative: 1.3 % (ref 0.0–3.0)
Eosinophils Absolute: 0.2 10*3/uL (ref 0.0–0.7)
Eosinophils Relative: 3.7 % (ref 0.0–5.0)
HCT: 40.7 % (ref 36.0–46.0)
Hemoglobin: 13.6 g/dL (ref 12.0–15.0)
Lymphocytes Relative: 29.3 % (ref 12.0–46.0)
Lymphs Abs: 2 10*3/uL (ref 0.7–4.0)
MCHC: 33.3 g/dL (ref 30.0–36.0)
MCV: 88.2 fl (ref 78.0–100.0)
Monocytes Absolute: 0.9 10*3/uL (ref 0.1–1.0)
Monocytes Relative: 12.7 % — ABNORMAL HIGH (ref 3.0–12.0)
Neutro Abs: 3.6 10*3/uL (ref 1.4–7.7)
Neutrophils Relative %: 53 % (ref 43.0–77.0)
Platelets: 212 10*3/uL (ref 150.0–400.0)
RBC: 4.62 Mil/uL (ref 3.87–5.11)
RDW: 14.1 % (ref 11.5–15.5)
WBC: 6.7 10*3/uL (ref 4.0–10.5)

## 2018-10-09 LAB — VITAMIN B12: Vitamin B-12: 298 pg/mL (ref 211–911)

## 2018-10-09 LAB — VITAMIN D 25 HYDROXY (VIT D DEFICIENCY, FRACTURES): VITD: 30.39 ng/mL (ref 30.00–100.00)

## 2018-10-11 LAB — VITAMIN B1: Vitamin B1 (Thiamine): 12 nmol/L (ref 8–30)

## 2018-10-12 ENCOUNTER — Encounter: Payer: Self-pay | Admitting: Medical

## 2018-10-15 ENCOUNTER — Encounter: Payer: Self-pay | Admitting: Medical

## 2018-10-15 ENCOUNTER — Ambulatory Visit: Payer: BLUE CROSS/BLUE SHIELD | Admitting: Medical

## 2018-10-15 ENCOUNTER — Ambulatory Visit: Payer: BLUE CROSS/BLUE SHIELD | Admitting: Cardiology

## 2018-10-15 VITALS — BP 143/55 | HR 59 | Temp 97.9°F | Resp 16 | Ht 65.0 in | Wt 150.0 lb

## 2018-10-15 DIAGNOSIS — M79662 Pain in left lower leg: Secondary | ICD-10-CM

## 2018-10-15 DIAGNOSIS — I10 Essential (primary) hypertension: Secondary | ICD-10-CM

## 2018-10-15 DIAGNOSIS — I872 Venous insufficiency (chronic) (peripheral): Secondary | ICD-10-CM | POA: Diagnosis not present

## 2018-10-15 NOTE — Progress Notes (Signed)
Subjective:    Patient ID: Lindsay Hodges, female    DOB: 1956-07-21, 63 y.o.   MRN: [id number]  HPI  Pt in for follow up.  She gave me update that with losartan 100 mg dose and chlorthalidone 25 mg she states felt like made her heart race and she thought caused petechia on abdomen. Not present now/resolved.  She sent me my chart message and update me and I advised get back on lower dose losartan 50 mg. She states since then she feels ok now with lower dose.   On losartan 100 mg and chorthalidone was 113/62, 116/49, 117/51.  When got back on just losartan 50 mg he bp was 147/ 55, 114/52, 126/54, 126/55, 142/61, 128/60, 126/53 and 130/58.  Pt will see Dr Stanford Breed next week.  Pt has same low level chronic ache to left calf. Years ago she was told had vascular issue that caused some pain. But not pvd and not dvt. Chart mentioned left venous reflux.  Pt reports most of pain at night. Pretibial veins.     Review of Systems  Constitutional: Negative for chills, fatigue and fever.  Respiratory: Negative for cough, chest tightness, shortness of breath and wheezing.   Cardiovascular: Negative for chest pain and palpitations.  Gastrointestinal: Negative for abdominal pain.  Musculoskeletal: Negative for back pain, gait problem and joint swelling.       Left anterior calf discomfort.  Skin: Negative for rash.  Neurological: Negative for dizziness and light-headedness.  Hematological: Negative for adenopathy. Does not bruise/bleed easily.  Psychiatric/Behavioral: Negative for behavioral problems and decreased concentration.    Past Medical History:  Diagnosis Date  . Arthritis   . Bursitis of hip 08/02/15  . Carotid disease, bilateral ([place])    [place] 10/15: R 40-59%, L 50-69%, stable for years  . Elevated sed rate    remote history of myelosuppressive disorder  . Grave's disease    s/p I-131 ablation 1990s  . Hyperlipidemia   . Hypertension   . HYPOTHYROIDISM   . Left knee pain  03/26/2014  . Personal history of colonic adenoma 11/30/2002  . Preventative health care 09/04/2015  . Thrombophlebitis 10/16/2016  . Vitamin D deficiency      Social History   Socioeconomic History  . Marital status: Married    Spouse name: Not on file  . Number of children: Not on file  . Years of education: Not on file  . Highest education level: Not on file  Occupational History    Comment: [employer]  Social Needs  . Financial resource strain: Not on file  . Food insecurity:    Worry: Not on file    Inability: Not on file  . Transportation needs:    Medical: Not on file    Non-medical: Not on file  Tobacco Use  . Smoking status: Former Smoker    Packs/day: 0.50    Years: 12.00    Pack years: 6.00    Last attempt to quit: 02/06/1993    Years since quitting: 25.7  . Smokeless tobacco: Never Used  Substance and Sexual Activity  . Alcohol use: No  . Drug use: No  . Sexual activity: Yes  Lifestyle  . Physical activity:    Days per week: Not on file    Minutes per session: Not on file  . Stress: Not on file  Relationships  . Social connections:    Talks on phone: Not on file    Gets together: Not on  file    Attends religious service: Not on file    Active member of club or organization: Not on file    Attends meetings of clubs or organizations: Not on file    Relationship status: Not on file  . Intimate partner violence:    Fear of current or ex partner: Not on file    Emotionally abused: Not on file    Physically abused: Not on file    Forced sexual activity: Not on file  Other Topics Concern  . Not on file  Social History Narrative   pharmacy tech III    Past Surgical History:  Procedure Laterality Date  . COLONOSCOPY    . Jaw surgery  2001  . OOPHORECTOMY Bilateral   . POLYPECTOMY    . VAGINAL HYSTERECTOMY  2009   vaginal hysterectomy    Family History  Problem Relation Age of Onset  . Hypertension Mother   . Arthritis Mother   .  Hyperlipidemia Mother   . Stroke Mother        1st age 9, then recurrent 87  . Atrial fibrillation Mother   . Epilepsy Mother   . Arthritis Father   . Breast cancer Sister   . Cancer Sister        Recurrent Breast CA  . Atrial fibrillation Brother   . Von Willebrand disease Daughter   . Other Daughter        POTS  . Factor V Leiden deficiency Daughter   . Breast cancer Other   . Drug abuse Maternal Aunt   . Diabetes Neg Hx   . Heart attack Neg Hx   . Colon cancer Neg Hx   . Esophageal cancer Neg Hx   . Rectal cancer Neg Hx   . Stomach cancer Neg Hx     Allergies  Allergen Reactions  . Penicillins     REACTION: swelling/dyspnea  . Statins Other (See Comments)    Myalgias on several statins    Current Outpatient Medications on File Prior to Visit  Medication Sig Dispense Refill  . chlorthalidone (HYGROTON) 25 MG tablet Take 1 tablet (25 mg total) by mouth daily. 30 tablet 11  . losartan-hydrochlorothiazide (HYZAAR) 50-12.5 MG tablet Take 1 tablet by mouth 2 (two) times daily. 60 tablet 3  . SYNTHROID 75 MCG tablet Take 1 tablet (75 mcg total) by mouth daily before breakfast. 90 tablet 1  . losartan (COZAAR) 100 MG tablet Take 1 tablet (100 mg total) by mouth daily. (Patient not taking: Reported on 10/15/2018) 30 tablet 3   No current facility-administered medications on file prior to visit.     BP (!) 143/55   Pulse (!) 59   Temp 97.9 F (36.6 C) (Oral)   Resp 16   Ht 5\' 5"  (1.651 m)   Wt 150 lb (68 kg)   SpO2 100%   BMI 24.96 kg/m       Objective:   Physical Exam  General- No acute distress. Pleasant patient. Neck- Full range of motion, no jvd Lungs- Clear, even and unlabored. Heart- regular rate and rhythm. Neurologic- CNII- XII grossly intact.  Lower ext- anterior aspect small superficial veins visible. No calf swelling. No edema. Negative homans signs.      Assessment & Plan:  Your blood pressure is recently controlled on just the losartan 50mg   tablets.  You mention some side effects with the higher dose losartan and chlorthalidone.  On review of your blood pressure readings with the losartan 50  mg dose it appears those are acceptable/adequate.  Would remain on that medication and get advice from Dr. Stanford Breed your cardiologist as well.  For history of chronic left calf discomfort with a little bit more discomfort recently, I did place ultrasound order and asking that it be done with [facility].  I think through them that you want get charged hospital type charge.  When you go over would recommend that you get clarification.  Or I will call your insurance to verify that would be a preferred location.  We will go ahead and also refer you back to your vascular surgeon Dr. Donnetta Hutching we will see if they do surgery for venous reflux that was described on prior report.  Follow-up as regular scheduled with PCP or as needed.  Mackie Pai, PA-C

## 2018-10-15 NOTE — Patient Instructions (Signed)
Your blood pressure is recently controlled on just the losartan 50mg  tablets.  You mention some side effects with the higher dose losartan and chlorthalidone.  On review of your blood pressure readings with the losartan 50 mg dose it appears those are acceptable/adequate.  Would remain on that medication and get advice from Dr. Stanford Breed your cardiologist as well.  For history of chronic left calf discomfort with a little bit more discomfort recently, I did place ultrasound order and asking that it be done with [facility].  I think through them that you want get charged hospital type charge.  When you go over would recommend that you get clarification.  Or I will call your insurance to verify that would be a preferred location.  We will go ahead and also refer you back to your vascular surgeon Dr. Donnetta Hutching we will see if they do surgery for venous reflux that was described on prior report.  Follow-up as regular scheduled with PCP or as needed.

## 2018-10-16 LAB — HM MAMMOGRAPHY

## 2018-10-17 NOTE — Progress Notes (Signed)
HPI: FU cerebrovascular disease. Echocardiogram in 2004 showed normal LV function. ABIs 6/16 normal.Nuclear study January 2017 showed ejection fraction 58% and normal perfusion. Carotid Dopplers November 2017 showed no hemodynamically significant stenosis bilaterally.  Abdominal ultrasound June 2018 showed no aneurysm. Since last seen,she has not had dyspnea, chest pain, palpitations or syncope.  Recently she had transient hot sensation in her left cheek and left arm heaviness.  No loss of strength or sensation and no dysarthria.  She had an MRI that showed no acute intracranial abnormality.  Current Outpatient Medications  Medication Sig Dispense Refill  . losartan (COZAAR) 50 MG tablet Take 50 mg by mouth daily.    Marland Kitchen SYNTHROID 75 MCG tablet Take 1 tablet (75 mcg total) by mouth daily before breakfast. 90 tablet 1   No current facility-administered medications for this visit.      Past Medical History:  Diagnosis Date  . Arthritis   . Bursitis of hip 08/02/15  . Carotid disease, bilateral ([place])    [place] 10/15: R 40-59%, L 50-69%, stable for years  . Elevated sed rate    remote history of myelosuppressive disorder  . Grave's disease    s/p I-131 ablation 1990s  . Hyperlipidemia   . Hypertension   . HYPOTHYROIDISM   . Left knee pain 03/26/2014  . Personal history of colonic adenoma 11/30/2002  . Preventative health care 09/04/2015  . Thrombophlebitis 10/16/2016  . Vitamin D deficiency     Past Surgical History:  Procedure Laterality Date  . COLONOSCOPY    . Jaw surgery  2001  . OOPHORECTOMY Bilateral   . POLYPECTOMY    . VAGINAL HYSTERECTOMY  2009   vaginal hysterectomy    Social History   Socioeconomic History  . Marital status: Married    Spouse name: Not on file  . Number of children: Not on file  . Years of education: Not on file  . Highest education level: Not on file  Occupational History    Comment: [employer]  Social Needs  . Financial resource  strain: Not on file  . Food insecurity:    Worry: Not on file    Inability: Not on file  . Transportation needs:    Medical: Not on file    Non-medical: Not on file  Tobacco Use  . Smoking status: Former Smoker    Packs/day: 0.50    Years: 12.00    Pack years: 6.00    Last attempt to quit: 02/06/1993    Years since quitting: 25.7  . Smokeless tobacco: Never Used  Substance and Sexual Activity  . Alcohol use: No  . Drug use: No  . Sexual activity: Yes  Lifestyle  . Physical activity:    Days per week: Not on file    Minutes per session: Not on file  . Stress: Not on file  Relationships  . Social connections:    Talks on phone: Not on file    Gets together: Not on file    Attends religious service: Not on file    Active member of club or organization: Not on file    Attends meetings of clubs or organizations: Not on file    Relationship status: Not on file  . Intimate partner violence:    Fear of current or ex partner: Not on file    Emotionally abused: Not on file    Physically abused: Not on file    Forced sexual activity: Not on file  Other Topics Concern  . Not on file  Social History Narrative   pharmacy tech III    Family History  Problem Relation Age of Onset  . Hypertension Mother   . Arthritis Mother   . Hyperlipidemia Mother   . Stroke Mother        1st age 50, then recurrent 75  . Atrial fibrillation Mother   . Epilepsy Mother   . Arthritis Father   . Breast cancer Sister   . Cancer Sister        Recurrent Breast CA  . Atrial fibrillation Brother   . Von Willebrand disease Daughter   . Other Daughter        POTS  . Factor V Leiden deficiency Daughter   . Breast cancer Other   . Drug abuse Maternal Aunt   . Diabetes Neg Hx   . Heart attack Neg Hx   . Colon cancer Neg Hx   . Esophageal cancer Neg Hx   . Rectal cancer Neg Hx   . Stomach cancer Neg Hx     ROS: no fevers or chills, productive cough, hemoptysis, dysphasia, odynophagia, melena,  hematochezia, dysuria, hematuria, rash, seizure activity, orthopnea, PND, pedal edema, claudication. Remaining systems are negative.  Physical Exam: Well-developed well-nourished in no acute distress.  Skin is warm and dry.  HEENT is normal.  Neck is supple.  Chest is clear to auscultation with normal expansion.  Cardiovascular exam is regular rate and rhythm.  Abdominal exam nontender or distended. No masses palpated. Extremities show no edema. neuro grossly intact  ECG- sinus bradycardia at a rate of 58, no ST changes.  personally reviewed  A/P  1 carotid artery disease-patient with recent left cheek warmth and numbness and left arm tingling/heaviness.  We will repeat carotid Dopplers.  MRI showed no CVA.  2 hypertension-patient's blood pressure is controlled.  Continue present medications and follow.  3 hyperlipidemia-patient is intolerant to statins.  Managed by primary care.  Kirk Ruths, MD

## 2018-10-20 ENCOUNTER — Ambulatory Visit: Payer: BLUE CROSS/BLUE SHIELD

## 2018-10-20 ENCOUNTER — Telehealth: Payer: Self-pay | Admitting: *Deleted

## 2018-10-20 DIAGNOSIS — M79662 Pain in left lower leg: Secondary | ICD-10-CM

## 2018-10-20 NOTE — Telephone Encounter (Signed)
Received Mammogram results from [place]; forwarded to provider/SLS 01/20

## 2018-10-22 ENCOUNTER — Ambulatory Visit (INDEPENDENT_AMBULATORY_CARE_PROVIDER_SITE_OTHER): Payer: BLUE CROSS/BLUE SHIELD | Admitting: Cardiology

## 2018-10-22 ENCOUNTER — Ambulatory Visit: Payer: BLUE CROSS/BLUE SHIELD | Admitting: Medical

## 2018-10-22 ENCOUNTER — Encounter: Payer: Self-pay | Admitting: Cardiology

## 2018-10-22 VITALS — BP 128/70 | HR 58 | Ht 65.0 in | Wt 151.0 lb

## 2018-10-22 DIAGNOSIS — I679 Cerebrovascular disease, unspecified: Secondary | ICD-10-CM | POA: Diagnosis not present

## 2018-10-22 DIAGNOSIS — I10 Essential (primary) hypertension: Secondary | ICD-10-CM | POA: Diagnosis not present

## 2018-10-22 DIAGNOSIS — E7800 Pure hypercholesterolemia, unspecified: Secondary | ICD-10-CM

## 2018-10-22 NOTE — Patient Instructions (Signed)
Medication Instructions:   NO CHANGE  Follow-Up:  Your physician recommends that you schedule a follow-up appointment in: [place] PLEASE GIVE OUR OFFICE A CALL 2 MONTHS PRIOR TO THAT APPOINTMENT TIME TO SCHEDULE   CALL IN November TO SCHEDULE APPOINTMENT IN January 2021

## 2018-10-23 ENCOUNTER — Other Ambulatory Visit: Payer: BLUE CROSS/BLUE SHIELD

## 2018-10-25 ENCOUNTER — Other Ambulatory Visit: Payer: Self-pay | Admitting: Family Medicine

## 2018-10-27 ENCOUNTER — Ambulatory Visit: Payer: BLUE CROSS/BLUE SHIELD

## 2018-10-27 ENCOUNTER — Encounter: Payer: Self-pay | Admitting: Family Medicine

## 2018-11-03 ENCOUNTER — Encounter: Payer: BLUE CROSS/BLUE SHIELD | Admitting: Family Medicine

## 2018-11-03 ENCOUNTER — Other Ambulatory Visit (HOSPITAL_COMMUNITY)
Admission: RE | Admit: 2018-11-03 | Discharge: 2018-11-03 | Disposition: A | Discharge status: Home / Self Care | Payer: BLUE CROSS/BLUE SHIELD | Source: Ambulatory Visit | Attending: Family Medicine | Admitting: Family Medicine

## 2018-11-03 ENCOUNTER — Encounter: Payer: Self-pay | Admitting: Family Medicine

## 2018-11-03 ENCOUNTER — Ambulatory Visit (INDEPENDENT_AMBULATORY_CARE_PROVIDER_SITE_OTHER): Payer: BLUE CROSS/BLUE SHIELD | Admitting: Family Medicine

## 2018-11-03 VITALS — BP 128/72 | HR 65 | Temp 97.7°F | Resp 18 | Ht 64.0 in | Wt 148.0 lb

## 2018-11-03 DIAGNOSIS — E039 Hypothyroidism, unspecified: Secondary | ICD-10-CM | POA: Diagnosis not present

## 2018-11-03 DIAGNOSIS — I10 Essential (primary) hypertension: Secondary | ICD-10-CM

## 2018-11-03 DIAGNOSIS — I779 Disorder of arteries and arterioles, unspecified: Secondary | ICD-10-CM

## 2018-11-03 DIAGNOSIS — R109 Unspecified abdominal pain: Secondary | ICD-10-CM | POA: Insufficient documentation

## 2018-11-03 DIAGNOSIS — J069 Acute upper respiratory infection, unspecified: Secondary | ICD-10-CM

## 2018-11-03 DIAGNOSIS — M546 Pain in thoracic spine: Secondary | ICD-10-CM | POA: Insufficient documentation

## 2018-11-03 DIAGNOSIS — Z124 Encounter for screening for malignant neoplasm of cervix: Secondary | ICD-10-CM | POA: Diagnosis not present

## 2018-11-03 DIAGNOSIS — I739 Peripheral vascular disease, unspecified: Secondary | ICD-10-CM

## 2018-11-03 DIAGNOSIS — Z0000 Encounter for general adult medical examination without abnormal findings: Secondary | ICD-10-CM | POA: Diagnosis not present

## 2018-11-03 DIAGNOSIS — R739 Hyperglycemia, unspecified: Secondary | ICD-10-CM | POA: Diagnosis not present

## 2018-11-03 DIAGNOSIS — R399 Unspecified symptoms and signs involving the genitourinary system: Secondary | ICD-10-CM

## 2018-11-03 DIAGNOSIS — E559 Vitamin D deficiency, unspecified: Secondary | ICD-10-CM

## 2018-11-03 DIAGNOSIS — E7800 Pure hypercholesterolemia, unspecified: Secondary | ICD-10-CM

## 2018-11-03 LAB — COMPREHENSIVE METABOLIC PANEL
ALT: 20 U/L (ref 0–35)
AST: 19 U/L (ref 0–37)
Albumin: 4.1 g/dL (ref 3.5–5.2)
Alkaline Phosphatase: 75 U/L (ref 39–117)
BUN: 15 mg/dL (ref 6–23)
CO2: 29 mEq/L (ref 19–32)
Calcium: 9.3 mg/dL (ref 8.4–10.5)
Chloride: 105 mEq/L (ref 96–112)
Creatinine, Ser: 0.7 mg/dL (ref 0.40–1.20)
GFR: 84.02 mL/min (ref >60.00)
Glucose, Bld: 87 mg/dL (ref 70–99)
Potassium: 4.2 mEq/L (ref 3.5–5.1)
Sodium: 140 mEq/L (ref 135–145)
Total Bilirubin: 0.5 mg/dL (ref 0.2–1.2)
Total Protein: 6.9 g/dL (ref 6.0–8.3)

## 2018-11-03 LAB — URINALYSIS
Bilirubin Urine: NEGATIVE
HGB URINE DIPSTICK: NEGATIVE
Ketones, ur: NEGATIVE
Leukocytes, UA: NEGATIVE
Nitrite: NEGATIVE
Specific Gravity, Urine: 1.025 (ref 1.000–1.030)
Total Protein, Urine: NEGATIVE
Urine Glucose: NEGATIVE
Urobilinogen, UA: 0.2 (ref 0.0–1.0)
pH: 6 (ref 5.0–8.0)

## 2018-11-03 LAB — LIPID PANEL
Cholesterol: 192 mg/dL (ref 0–200)
HDL: 49.9 mg/dL (ref >39.00)
LDL Cholesterol: 120 mg/dL — ABNORMAL HIGH (ref 0–99)
NonHDL: 141.76
Total CHOL/HDL Ratio: 4
Triglycerides: 109 mg/dL (ref 0.0–149.0)
VLDL: 21.8 mg/dL (ref 0.0–40.0)

## 2018-11-03 LAB — HEMOGLOBIN A1C: Hgb A1c MFr Bld: 6.4 % (ref 4.6–6.5)

## 2018-11-03 LAB — TSH: TSH: 0.97 u[IU]/mL (ref 0.35–4.50)

## 2018-11-03 NOTE — Progress Notes (Signed)
Subjective:    Patient ID: Lindsay Hodges, female    DOB: 06-23-1956, 63 y.o.   MRN: [id number]  No chief complaint on file.   HPI  Patient is in today for annual physical and wellness exam. Pt had a recent trip to urgent care because of some left sided weakness and tingling. She has followed up with her cardiologist and it has begun to resolve. She had a carotid ultrasound showing 70% stenosis in the left carotid and a lower extremity ultrasound showing nothing worrisome. Pt is also recovering from a URI with some mid-upper back pain occurring midday daily lasting all day. She does not complain of fever, chills, or excessive fatigue.  Patient Care Team: Mosie Lukes, MD as PCP - General (Family Medicine) Stanford Breed Denice Bors, MD as PCP - Cardiology (Cardiology) Gatha Mayer, MD (Gastroenterology) Camillo Flaming, OD as Referring Physician (Optometry)   Past Medical History:  Diagnosis Date  . Arthritis   . Bursitis of hip 08/02/15  . Carotid disease, bilateral ([place])    [place] 10/15: R 40-59%, L 50-69%, stable for years  . Elevated sed rate    remote history of myelosuppressive disorder  . Grave's disease    s/p I-131 ablation 1990s  . Hyperlipidemia   . Hypertension   . HYPOTHYROIDISM   . Left knee pain 03/26/2014  . Personal history of colonic adenoma 11/30/2002  . Preventative health care 09/04/2015  . Thrombophlebitis 10/16/2016  . Vitamin D deficiency     Past Surgical History:  Procedure Laterality Date  . COLONOSCOPY    . Jaw surgery  2001  . OOPHORECTOMY Bilateral   . POLYPECTOMY    . VAGINAL HYSTERECTOMY  2009   vaginal hysterectomy    Family History  Problem Relation Age of Onset  . Hypertension Mother   . Arthritis Mother   . Hyperlipidemia Mother   . Stroke Mother        1st age 32, then recurrent 28  . Atrial fibrillation Mother   . Epilepsy Mother   . Arthritis Father   . Breast cancer Sister   . Cancer Sister        Recurrent Breast CA  . Atrial  fibrillation Brother   . Von Willebrand disease Daughter   . Other Daughter        POTS  . Factor V Leiden deficiency Daughter   . Breast cancer Other   . Drug abuse Maternal Aunt   . Diabetes Neg Hx   . Heart attack Neg Hx   . Colon cancer Neg Hx   . Esophageal cancer Neg Hx   . Rectal cancer Neg Hx   . Stomach cancer Neg Hx     Social History   Socioeconomic History  . Marital status: Married    Spouse name: Not on file  . Number of children: Not on file  . Years of education: Not on file  . Highest education level: Not on file  Occupational History    Comment: [employer]  Social Needs  . Financial resource strain: Not on file  . Food insecurity:    Worry: Not on file    Inability: Not on file  . Transportation needs:    Medical: Not on file    Non-medical: Not on file  Tobacco Use  . Smoking status: Former Smoker    Packs/day: 0.50    Years: 12.00    Pack years: 6.00    Last attempt to quit: 02/06/1993  Years since quitting: 25.7  . Smokeless tobacco: Never Used  Substance and Sexual Activity  . Alcohol use: No  . Drug use: No  . Sexual activity: Yes  Lifestyle  . Physical activity:    Days per week: Not on file    Minutes per session: Not on file  . Stress: Not on file  Relationships  . Social connections:    Talks on phone: Not on file    Gets together: Not on file    Attends religious service: Not on file    Active member of club or organization: Not on file    Attends meetings of clubs or organizations: Not on file    Relationship status: Not on file  . Intimate partner violence:    Fear of current or ex partner: Not on file    Emotionally abused: Not on file    Physically abused: Not on file    Forced sexual activity: Not on file  Other Topics Concern  . Not on file  Social History Narrative   pharmacy tech III    Outpatient Medications Prior to Visit  Medication Sig Dispense Refill  . losartan (COZAAR) 50 MG tablet TAKE 1  TABLET (50 MG TOTAL) BY MOUTH DAILY. 90 tablet 2  . SYNTHROID 75 MCG tablet Take 1 tablet (75 mcg total) by mouth daily before breakfast. 90 tablet 1   No facility-administered medications prior to visit.     Allergies  Allergen Reactions  . Penicillins     REACTION: swelling/dyspnea  . Statins Other (See Comments)    Myalgias on several statins    Review of Systems  Constitutional: Negative for chills, fever, malaise/fatigue and weight loss.  HENT: Negative for ear pain and hearing loss.   Eyes: Negative for blurred vision and double vision.  Respiratory: Negative for shortness of breath.   Cardiovascular: Negative for chest pain and palpitations.  Gastrointestinal: Negative for abdominal pain, constipation and diarrhea.       Objective:     Lindsay Hodges appears her stated age, WDWN, and in no acute distress.  Physical Exam Constitutional:      General: She is not in acute distress.    Appearance: Normal appearance. She is normal weight.  HENT:     Head: Normocephalic and atraumatic.     Right Ear: Tympanic membrane normal.     Left Ear: Tympanic membrane normal.     Nose: Nose normal.     Mouth/Throat:     Mouth: Mucous membranes are moist.     Pharynx: Oropharynx is clear.  Eyes:     Extraocular Movements: Extraocular movements intact.     Pupils: Pupils are equal, round, and reactive to light.  Neck:     Musculoskeletal: Neck supple.  Cardiovascular:     Rate and Rhythm: Normal rate and regular rhythm.     Heart sounds: Normal heart sounds.  Pulmonary:     Effort: Pulmonary effort is normal.     Breath sounds: Normal breath sounds.  Abdominal:     General: Bowel sounds are normal.     Tenderness: There is no right CVA tenderness or left CVA tenderness.  Neurological:     Mental Status: She is alert and oriented to person, place, and time.  Psychiatric:        Mood and Affect: Mood normal.        Behavior: Behavior normal.     BP 128/72 (BP Location:  Left Arm, Patient Position: Sitting, Cuff  Size: Normal)   Pulse 65   Temp 97.7 F (36.5 C) (Oral)   Resp 18   Ht 5\' 4"  (1.626 m)   Wt 67.1 kg   SpO2 98%   BMI 25.40 kg/m  Wt Readings from Last 3 Encounters:  11/03/18 67.1 kg  10/22/18 68.5 kg  10/15/18 68 kg   BP Readings from Last 3 Encounters:  11/03/18 128/72  10/22/18 128/70  10/15/18 (!) 143/55     Immunization History  Administered Date(s) Administered  . Influenza Inj Mdck Quad Pf 06/23/2017  . Influenza Split 07/01/2012, 07/01/2013  . Influenza-Unspecified 06/15/2014, 06/02/2015, 06/23/2017, 06/03/2018  . Tdap 10/02/2011  . Zoster 08/23/2014  . Zoster Recombinat (Shingrix) 03/27/2018, 06/03/2018    Health Maintenance  Topic Date Due  . HIV Screening  12/29/1968  . PAP SMEAR-Modifier  03/01/2020  . MAMMOGRAM  10/16/2020  . TETANUS/TDAP  10/01/2021  . COLONOSCOPY  08/01/2023  . INFLUENZA VACCINE  Completed  . Hepatitis C Screening  Completed    Lab Results  Component Value Date   WBC 6.7 10/08/2018   HGB 13.6 10/08/2018   HCT 40.7 10/08/2018   PLT 212.0 10/08/2018   GLUCOSE 95 10/08/2018   CHOL 226 (H) 10/29/2017   TRIG 108.0 10/29/2017   HDL 64.20 10/29/2017   LDLCALC 140 (H) 10/29/2017   ALT 45 (H) 10/08/2018   AST 37 10/08/2018   NA 137 10/08/2018   K 3.8 10/08/2018   CL 99 10/08/2018   CREATININE 0.76 10/08/2018   BUN 19 10/08/2018   CO2 30 10/08/2018   TSH 1.68 04/14/2018   HGBA1C 5.9 10/29/2017    Lab Results  Component Value Date   TSH 1.68 04/14/2018   Lab Results  Component Value Date   WBC 6.7 10/08/2018   HGB 13.6 10/08/2018   HCT 40.7 10/08/2018   MCV 88.2 10/08/2018   PLT 212.0 10/08/2018   Lab Results  Component Value Date   NA 137 10/08/2018   K 3.8 10/08/2018   CO2 30 10/08/2018   GLUCOSE 95 10/08/2018   BUN 19 10/08/2018   CREATININE 0.76 10/08/2018   BILITOT 0.4 10/08/2018   ALKPHOS 85 10/08/2018   AST 37 10/08/2018   ALT 45 (H) 10/08/2018   PROT 7.3  10/08/2018   ALBUMIN 4.4 10/08/2018   CALCIUM 10.0 10/08/2018   GFR 81.23 10/08/2018   Lab Results  Component Value Date   CHOL 226 (H) 10/29/2017   Lab Results  Component Value Date   HDL 64.20 10/29/2017   Lab Results  Component Value Date   LDLCALC 140 (H) 10/29/2017   Lab Results  Component Value Date   TRIG 108.0 10/29/2017   Lab Results  Component Value Date   CHOLHDL 4 10/29/2017   Lab Results  Component Value Date   HGBA1C 5.9 10/29/2017         Assessment & Plan:   Problems Addressed this Visit  Hypertension: Well controlled on Losartan. Encouraged heart healthy diet and increased exercise as tolerated. - orders: CMP  Carotid Disease: Pt has had increasing symptomatology with left sided numbness, tingling, and weakness. It worsened at the beginning of the year and is now improving. Cardiologist manages this pt's carotid disease. - orders: lipid panel  Hypothyroidism: Pt has a history of hypothyroidism. Recheck TSH today - orders: TSH  Hyperlipidemia: Labs from last year showed some hyperlipidemia. Recheck today. She is currently tolerating her statin. Encouraged heart healthy diet as well as inc exercise. - orders: lipid  panel  Hyperglycemia: Well controlled. Recheck A1c today. Encouraged increased exercise as tolerated, as well as heart healthy diet with decreased fast food. - orders: HgB A1c  Cervical Cancer Screening: Pt had ASCUS on last PAP, recheck today to ensure resolution of inflammation. - orders: PAP smear  Flank pain: mid back pain occurring concurrently with symptoms from her recent URI. No CVA tenderness. Checking UA and culture to look for signs of infection. - orders: UA, urine culture  I am having Lindsay Hodges maintain her SYNTHROID and losartan.  No orders of the defined types were placed in this encounter.   Court Joy, Student-PA

## 2018-11-03 NOTE — Patient Instructions (Signed)
Encouraged increased rest and hydration, add probiotics, zinc such as Coldeze or Xicam. Treat fevers as needed. Elderberry, Vitamin C daily Preventive Care 40-64 Years, Female Preventive care refers to lifestyle choices and visits with your health care provider that can promote health and wellness. What does preventive care include?   A yearly physical exam. This is also called an annual well check.  Dental exams once or twice a year.  Routine eye exams. Ask your health care provider how often you should have your eyes checked.  Personal lifestyle choices, including: ? Daily care of your teeth and gums. ? Regular physical activity. ? Eating a healthy diet. ? Avoiding tobacco and drug use. ? Limiting alcohol use. ? Practicing safe sex. ? Taking low-dose aspirin daily starting at age 33. ? Taking vitamin and mineral supplements as recommended by your health care provider. What happens during an annual well check? The services and screenings done by your health care provider during your annual well check will depend on your age, overall health, lifestyle risk factors, and family history of disease. Counseling Your health care provider may ask you questions about your:  Alcohol use.  Tobacco use.  Drug use.  Emotional well-being.  Home and relationship well-being.  Sexual activity.  Eating habits.  Work and work Statistician.  Method of birth control.  Menstrual cycle.  Pregnancy history. Screening You may have the following tests or measurements:  Height, weight, and BMI.  Blood pressure.  Lipid and cholesterol levels. These may be checked every 5 years, or more frequently if you are over 49 years old.  Skin check.  Lung cancer screening. You may have this screening every year starting at age 53 if you have a 30-pack-year history of smoking and currently smoke or have quit within the past 15 years.  Colorectal cancer screening. All adults should have this  screening starting at age 62 and continuing until age 65. Your health care provider may recommend screening at age 57. You will have tests every 1-10 years, depending on your results and the type of screening test. People at increased risk should start screening at an earlier age. Screening tests may include: ? Guaiac-based fecal occult blood testing. ? Fecal immunochemical test (FIT). ? Stool DNA test. ? Virtual colonoscopy. ? Sigmoidoscopy. During this test, a flexible tube with a tiny camera (sigmoidoscope) is used to examine your rectum and lower colon. The sigmoidoscope is inserted through your anus into your rectum and lower colon. ? Colonoscopy. During this test, a long, thin, flexible tube with a tiny camera (colonoscope) is used to examine your entire colon and rectum.  Hepatitis C blood test.  Hepatitis B blood test.  Sexually transmitted disease (STD) testing.  Diabetes screening. This is done by checking your blood sugar (glucose) after you have not eaten for a while (fasting). You may have this done every 1-3 years.  Mammogram. This may be done every 1-2 years. Talk to your health care provider about when you should start having regular mammograms. This may depend on whether you have a family history of breast cancer.  BRCA-related cancer screening. This may be done if you have a family history of breast, ovarian, tubal, or peritoneal cancers.  Pelvic exam and Pap test. This may be done every 3 years starting at age 26. Starting at age 28, this may be done every 5 years if you have a Pap test in combination with an HPV test.  Bone density scan. This is done to screen  for osteoporosis. You may have this scan if you are at high risk for osteoporosis. Discuss your test results, treatment options, and if necessary, the need for more tests with your health care provider. Vaccines Your health care provider may recommend certain vaccines, such as:  Influenza vaccine. This is  recommended every year.  Tetanus, diphtheria, and acellular pertussis (Tdap, Td) vaccine. You may need a Td booster every 10 years.  Varicella vaccine. You may need this if you have not been vaccinated.  Zoster vaccine. You may need this after age 29.  Measles, mumps, and rubella (MMR) vaccine. You may need at least one dose of MMR if you were born in 1957 or later. You may also need a second dose.  Pneumococcal 13-valent conjugate (PCV13) vaccine. You may need this if you have certain conditions and were not previously vaccinated.  Pneumococcal polysaccharide (PPSV23) vaccine. You may need one or two doses if you smoke cigarettes or if you have certain conditions.  Meningococcal vaccine. You may need this if you have certain conditions.  Hepatitis A vaccine. You may need this if you have certain conditions or if you travel or work in places where you may be exposed to hepatitis A.  Hepatitis B vaccine. You may need this if you have certain conditions or if you travel or work in places where you may be exposed to hepatitis B.  Haemophilus influenzae type b (Hib) vaccine. You may need this if you have certain conditions. Talk to your health care provider about which screenings and vaccines you need and how often you need them. This information is not intended to replace advice given to you by your health care provider. Make sure you discuss any questions you have with your health care provider. Document Released: 10/14/2015 Document Revised: 11/07/2017 Document Reviewed: 07/19/2015 Elsevier Interactive Patient Education  2019 [employer].

## 2018-11-03 NOTE — Assessment & Plan Note (Signed)
Well controlled, no changes to meds. Encouraged heart healthy diet such as the DASH diet and exercise as tolerated.  °

## 2018-11-03 NOTE — Assessment & Plan Note (Signed)
Bilateral with recent URI but will check UA and culture

## 2018-11-03 NOTE — Assessment & Plan Note (Signed)
hgba1c acceptable, minimize simple carbs. Increase exercise as tolerated.  

## 2018-11-03 NOTE — Assessment & Plan Note (Signed)
Tolerating statin, encouraged heart healthy diet, avoid trans fats, minimize simple carbs and saturated fats. Increase exercise as tolerated 

## 2018-11-03 NOTE — Assessment & Plan Note (Signed)
On Levothyroxine, continue to monitor 

## 2018-11-05 ENCOUNTER — Other Ambulatory Visit: Payer: Self-pay | Admitting: *Deleted

## 2018-11-05 DIAGNOSIS — I679 Cerebrovascular disease, unspecified: Secondary | ICD-10-CM

## 2018-11-05 LAB — CYTOLOGY - PAP: Diagnosis: NEGATIVE

## 2018-11-05 LAB — URINE CULTURE
MICRO NUMBER:: 141665
SPECIMEN QUALITY:: ADEQUATE

## 2018-11-06 MED ORDER — SULFAMETHOXAZOLE-TRIMETHOPRIM 800-160 MG PO TABS: 1.0000 | ORAL_TABLET | Freq: Two times a day (BID) | ORAL | 0 refills | Status: DC

## 2018-11-09 DIAGNOSIS — J069 Acute upper respiratory infection, unspecified: Secondary | ICD-10-CM | POA: Insufficient documentation

## 2018-11-09 NOTE — Assessment & Plan Note (Addendum)
Encouraged increased rest and hydration, add probiotics, zinc such as Coldeze or Xicam. Treat fevers as needed, elderberry, vitamin C and Mucinex

## 2018-11-09 NOTE — Assessment & Plan Note (Signed)
Patient encouraged to maintain heart healthy diet, regular exercise, adequate sleep. Consider daily probiotics. Take medications as prescribed. Given and reviewed copy of ACP documents from [place] Secretary of State and encouraged to complete and return 

## 2018-11-09 NOTE — Assessment & Plan Note (Signed)
Urine culture confirms infection. Bactrim bid

## 2018-11-09 NOTE — Progress Notes (Signed)
Subjective:    Patient ID: Lindsay Hodges, female    DOB: 1956-05-13, 63 y.o.   MRN: [id number]  No chief complaint on file.   HPI Patient is in today for annual preventative exam and follow up on chronic medical concerns including hypertension, hyperglycemia and hyperlipidemia . She is feeling run down with fatigue, flank pain, urinary frequency, congestion, cough and more. No febrile illness or chills. No recent hospitalizaitons. Denies CP/palp/SOB/HA/congestion/fevers/GI or GU c/o. Taking meds as prescribed. Tries to maintain a heart healthy diet and stay active.   Past Medical History:  Diagnosis Date  . Arthritis   . Bursitis of hip 08/02/15  . Carotid disease, bilateral ([place])    [place] 10/15: R 40-59%, L 50-69%, stable for years  . Elevated sed rate    remote history of myelosuppressive disorder  . Grave's disease    s/p I-131 ablation 1990s  . Hyperlipidemia   . Hypertension   . HYPOTHYROIDISM   . Left knee pain 03/26/2014  . Personal history of colonic adenoma 11/30/2002  . Preventative health care 09/04/2015  . Thrombophlebitis 10/16/2016  . Vitamin D deficiency     Past Surgical History:  Procedure Laterality Date  . COLONOSCOPY    . Jaw surgery  2001  . OOPHORECTOMY Bilateral   . POLYPECTOMY    . VAGINAL HYSTERECTOMY  2009   vaginal hysterectomy    Family History  Problem Relation Age of Onset  . Hypertension Mother   . Arthritis Mother   . Hyperlipidemia Mother   . Stroke Mother        1st age 70, then recurrent 5  . Atrial fibrillation Mother   . Epilepsy Mother   . Arthritis Father   . Breast cancer Sister   . Cancer Sister        Recurrent Breast CA  . Atrial fibrillation Brother   . Von Willebrand disease Daughter   . Other Daughter        POTS  . Factor V Leiden deficiency Daughter   . Breast cancer Other   . Drug abuse Maternal Aunt   . Diabetes Neg Hx   . Heart attack Neg Hx   . Colon cancer Neg Hx   . Esophageal cancer Neg Hx   . Rectal  cancer Neg Hx   . Stomach cancer Neg Hx     Social History   Socioeconomic History  . Marital status: Married    Spouse name: Not on file  . Number of children: Not on file  . Years of education: Not on file  . Highest education level: Not on file  Occupational History    Comment: [employer]  Social Needs  . Financial resource strain: Not on file  . Food insecurity:    Worry: Not on file    Inability: Not on file  . Transportation needs:    Medical: Not on file    Non-medical: Not on file  Tobacco Use  . Smoking status: Former Smoker    Packs/day: 0.50    Years: 12.00    Pack years: 6.00    Last attempt to quit: 02/06/1993    Years since quitting: 25.7  . Smokeless tobacco: Never Used  Substance and Sexual Activity  . Alcohol use: No  . Drug use: No  . Sexual activity: Yes  Lifestyle  . Physical activity:    Days per week: Not on file    Minutes per session: Not on file  . Stress:  Not on file  Relationships  . Social connections:    Talks on phone: Not on file    Gets together: Not on file    Attends religious service: Not on file    Active member of club or organization: Not on file    Attends meetings of clubs or organizations: Not on file    Relationship status: Not on file  . Intimate partner violence:    Fear of current or ex partner: Not on file    Emotionally abused: Not on file    Physically abused: Not on file    Forced sexual activity: Not on file  Other Topics Concern  . Not on file  Social History Narrative   pharmacy tech III    Outpatient Medications Prior to Visit  Medication Sig Dispense Refill  . losartan (COZAAR) 50 MG tablet TAKE 1 TABLET (50 MG TOTAL) BY MOUTH DAILY. 90 tablet 2  . SYNTHROID 75 MCG tablet Take 1 tablet (75 mcg total) by mouth daily before breakfast. 90 tablet 1   No facility-administered medications prior to visit.     Allergies  Allergen Reactions  . Penicillins     REACTION: swelling/dyspnea  .  Statins Other (See Comments)    Myalgias on several statins    Review of Systems  Constitutional: Positive for malaise/fatigue. Negative for chills and fever.  HENT: Positive for congestion. Negative for hearing loss.   Eyes: Negative for discharge.  Respiratory: Positive for cough. Negative for sputum production and shortness of breath.   Cardiovascular: Negative for chest pain, palpitations and leg swelling.  Gastrointestinal: Negative for abdominal pain, blood in stool, constipation, diarrhea, heartburn, nausea and vomiting.  Genitourinary: Positive for flank pain and frequency. Negative for dysuria, hematuria and urgency.  Musculoskeletal: Negative for back pain, falls and myalgias.  Skin: Negative for rash.  Neurological: Negative for dizziness, sensory change, loss of consciousness, weakness and headaches.  Endo/Heme/Allergies: Negative for environmental allergies. Does not bruise/bleed easily.  Psychiatric/Behavioral: Negative for depression and suicidal ideas. The patient is not nervous/anxious and does not have insomnia.        Objective:    Physical Exam Constitutional:      General: She is not in acute distress.    Appearance: She is not diaphoretic.  HENT:     Head: Normocephalic and atraumatic.     Right Ear: External ear normal.     Left Ear: External ear normal.     Nose: Nose normal.     Mouth/Throat:     Pharynx: No oropharyngeal exudate.  Eyes:     General: No scleral icterus.       Right eye: No discharge.        Left eye: No discharge.     Conjunctiva/sclera: Conjunctivae normal.     Pupils: Pupils are equal, round, and reactive to light.  Neck:     Musculoskeletal: Normal range of motion and neck supple.     Thyroid: No thyromegaly.  Cardiovascular:     Rate and Rhythm: Normal rate and regular rhythm.     Heart sounds: Normal heart sounds. No murmur.  Pulmonary:     Effort: Pulmonary effort is normal. No respiratory distress.     Breath sounds:  Normal breath sounds. No wheezing or rales.  Abdominal:     General: Bowel sounds are normal. There is no distension.     Palpations: Abdomen is soft. There is no mass.     Tenderness: There is no abdominal  tenderness.  Musculoskeletal: Normal range of motion.        General: No tenderness.  Lymphadenopathy:     Cervical: No cervical adenopathy.  Skin:    General: Skin is warm and dry.     Findings: No rash.  Neurological:     Mental Status: She is alert and oriented to person, place, and time.     Cranial Nerves: No cranial nerve deficit.     Coordination: Coordination normal.     Deep Tendon Reflexes: Reflexes are normal and symmetric. Reflexes normal.     BP 128/72 (BP Location: Left Arm, Patient Position: Sitting, Cuff Size: Normal)   Pulse 65   Temp 97.7 F (36.5 C) (Oral)   Resp 18   Ht 5\' 4"  (1.626 m)   Wt 148 lb (67.1 kg)   SpO2 98%   BMI 25.40 kg/m  Wt Readings from Last 3 Encounters:  11/03/18 148 lb (67.1 kg)  10/22/18 151 lb (68.5 kg)  10/15/18 150 lb (68 kg)     Lab Results  Component Value Date   WBC 6.7 10/08/2018   HGB 13.6 10/08/2018   HCT 40.7 10/08/2018   PLT 212.0 10/08/2018   GLUCOSE 87 11/03/2018   CHOL 192 11/03/2018   TRIG 109.0 11/03/2018   HDL 49.90 11/03/2018   LDLCALC 120 (H) 11/03/2018   ALT 20 11/03/2018   AST 19 11/03/2018   NA 140 11/03/2018   K 4.2 11/03/2018   CL 105 11/03/2018   CREATININE 0.70 11/03/2018   BUN 15 11/03/2018   CO2 29 11/03/2018   TSH 0.97 11/03/2018   HGBA1C 6.4 11/03/2018    Lab Results  Component Value Date   TSH 0.97 11/03/2018   Lab Results  Component Value Date   WBC 6.7 10/08/2018   HGB 13.6 10/08/2018   HCT 40.7 10/08/2018   MCV 88.2 10/08/2018   PLT 212.0 10/08/2018   Lab Results  Component Value Date   NA 140 11/03/2018   K 4.2 11/03/2018   CO2 29 11/03/2018   GLUCOSE 87 11/03/2018   BUN 15 11/03/2018   CREATININE 0.70 11/03/2018   BILITOT 0.5 11/03/2018   ALKPHOS 75  11/03/2018   AST 19 11/03/2018   ALT 20 11/03/2018   PROT 6.9 11/03/2018   ALBUMIN 4.1 11/03/2018   CALCIUM 9.3 11/03/2018   GFR 84.02 11/03/2018   Lab Results  Component Value Date   CHOL 192 11/03/2018   Lab Results  Component Value Date   HDL 49.90 11/03/2018   Lab Results  Component Value Date   LDLCALC 120 (H) 11/03/2018   Lab Results  Component Value Date   TRIG 109.0 11/03/2018   Lab Results  Component Value Date   CHOLHDL 4 11/03/2018   Lab Results  Component Value Date   HGBA1C 6.4 11/03/2018       Assessment & Plan:   Problem List Items Addressed This Visit    Hypothyroidism    On Levothyroxine, continue to monitor      Hyperlipidemia    Tolerating statin, encouraged heart healthy diet, avoid trans fats, minimize simple carbs and saturated fats. Increase exercise as tolerated      Relevant Orders   Lipid Profile (Completed)   Hypertension - Primary    Well controlled, no changes to meds. Encouraged heart healthy diet such as the DASH diet and exercise as tolerated.       Relevant Orders   Lipid Profile (Completed)   Comprehensive metabolic panel (Completed)  TSH (Completed)   Carotid disease, bilateral ([place])   Relevant Orders   Lipid Profile (Completed)   Vitamin D deficiency    Supplement and monitor      UTI symptoms    Urine culture confirms infection. Bactrim bid      Preventative health care    Patient encouraged to maintain heart healthy diet, regular exercise, adequate sleep. Consider daily probiotics. Take medications as prescribed. Given and reviewed copy of ACP documents from [place] and encouraged to complete and return      Hyperglycemia    hgba1c acceptable, minimize simple carbs. Increase exercise as tolerated.       Relevant Orders   Hemoglobin A1c (Completed)   Cervical cancer screening   Relevant Orders   Cytology - PAP( [place]) (Completed)   Flank pain    Bilateral with recent URI but will  check UA and culture      Relevant Orders   Urinalysis (Completed)   Urine Culture (Completed)   Upper respiratory infection    Encouraged increased rest and hydration, add probiotics, zinc such as Coldeze or Xicam. Treat fevers as needed, elderberry, vitamin C and Mucinex       Relevant Medications   sulfamethoxazole-trimethoprim (BACTRIM DS,SEPTRA DS) 800-160 MG tablet      I am having Lakitha Rensch start on sulfamethoxazole-trimethoprim. I am also having her maintain her SYNTHROID and losartan.  Meds ordered this encounter  Medications  . sulfamethoxazole-trimethoprim (BACTRIM DS,SEPTRA DS) 800-160 MG tablet    Sig: Take 1 tablet by mouth 2 (two) times daily.    Dispense:  10 tablet    Refill:  0     Penni Homans, MD

## 2018-11-09 NOTE — Assessment & Plan Note (Signed)
Pap today, no concerns on exam.  

## 2018-11-09 NOTE — Assessment & Plan Note (Signed)
Supplement and monitor 

## 2018-11-11 ENCOUNTER — Telehealth: Payer: Self-pay | Admitting: Cardiology

## 2018-11-11 NOTE — Telephone Encounter (Signed)
New Message:     She wanted Hilda Blades to know that she have not heard from the [facility].

## 2018-11-12 ENCOUNTER — Telehealth: Payer: Self-pay | Admitting: Family Medicine

## 2018-11-12 NOTE — Telephone Encounter (Signed)
Copied from [place] #[id number]. Topic: General - Inquiry >> Nov 12, 2018  2:49 PM Virl Axe D wrote: Reason for CRM: Pt stated that Medicare will be reaching out to Dr. Charlett Blake about changing pt to generic synthroid and she does not want to change. She would like to stay on her current medication. Please advise

## 2018-11-12 NOTE — Telephone Encounter (Signed)
Patient request to stay on current Synthroid- she may need authorization paperwork to do that.

## 2018-11-12 NOTE — Telephone Encounter (Signed)
Spoke with [facility], appointment scheduled with dr Donzetta Matters 12-12-2018 @ 10:30 am. Left appointment information on voice mail for patient.

## 2018-11-14 NOTE — Telephone Encounter (Signed)
I guess the patient will have to check with her insurance and find out if there is a path forward for [place] to prescribe the brand name. If there is a process they will approve she can let us know and we can do it but we cannot force her insurance company to do anything.

## 2018-11-14 NOTE — Telephone Encounter (Signed)
Paperwork received regarding medication, per [employer] will not cover Brand Synthroid and patient does not want to be to switched to generic; forwarded to provider/SLS 02/14

## 2018-12-10 ENCOUNTER — Other Ambulatory Visit: Payer: Self-pay | Admitting: Family Medicine

## 2018-12-10 DIAGNOSIS — I10 Essential (primary) hypertension: Secondary | ICD-10-CM

## 2018-12-10 MED ORDER — SYNTHROID 75 MCG PO TABS: 75.0000 ug | ORAL_TABLET | Freq: Every day | ORAL | 1 refills | Status: DC

## 2018-12-10 MED ORDER — LOSARTAN POTASSIUM 50 MG PO TABS: ORAL_TABLET | ORAL | 2 refills | Status: DC

## 2018-12-10 NOTE — Telephone Encounter (Signed)
Copied from [place] [id number]. Topic: Quick Communication - Rx Refill/Question >> Dec 10, 2018  1:26 PM Scherrie Gerlach wrote: Medication: SYNTHROID 75 MCG tablet losartan (COZAAR) 50 MG tablet 90 day each Pt has new insurance and must use [facility].  Also pt wants to make sure this is sent in as name brand synthroid [place], [place] - [address] [phone] (Phone) [phone] (Fax)

## 2018-12-11 ENCOUNTER — Other Ambulatory Visit: Payer: Self-pay

## 2018-12-11 ENCOUNTER — Other Ambulatory Visit: Payer: Self-pay | Admitting: Family Medicine

## 2018-12-11 ENCOUNTER — Ambulatory Visit (INDEPENDENT_AMBULATORY_CARE_PROVIDER_SITE_OTHER): Payer: Medicare Other | Admitting: Family Medicine

## 2018-12-11 ENCOUNTER — Encounter: Payer: Self-pay | Admitting: Family Medicine

## 2018-12-11 VITALS — BP 138/72 | HR 72 | Temp 97.6°F | Ht 65.0 in | Wt 148.0 lb

## 2018-12-11 DIAGNOSIS — N3000 Acute cystitis without hematuria: Secondary | ICD-10-CM

## 2018-12-11 LAB — POC URINALSYSI DIPSTICK (AUTOMATED)
BILIRUBIN UA: NEGATIVE
Blood, UA: NEGATIVE
Glucose, UA: NEGATIVE
Ketones, UA: NEGATIVE
Leukocytes, UA: NEGATIVE
Nitrite, UA: NEGATIVE
Protein, UA: NEGATIVE
Spec Grav, UA: 1.015 (ref 1.010–1.025)
Urobilinogen, UA: 0.2 E.U./dL
pH, UA: 6.5 (ref 5.0–8.0)

## 2018-12-11 MED ORDER — NITROFURANTOIN MONOHYD MACRO 100 MG PO CAPS: 100.0000 mg | ORAL_CAPSULE | Freq: Two times a day (BID) | ORAL | 0 refills | Status: DC

## 2018-12-11 NOTE — Progress Notes (Signed)
Chief Complaint  Patient presents with  . Back Pain  . Urinary Frequency  . Chills    Lindsay Hodges is a 63 y.o. female here for possible UTI.  Duration: 1 day. Symptoms: chills, incomplete emptying, dysuria urgency, low back pain Denies: hematuria, urinary hesitancy, urinary retention, fever, nausea and vomiting, vaginal discharge  ROS:  Constitutional: denies fever GU: As noted in HPI  Past Medical History:  Diagnosis Date  . Arthritis   . Bursitis of hip 08/02/15  . Carotid disease, bilateral ([place])    [place] 10/15: R 40-59%, L 50-69%, stable for years  . Elevated sed rate    remote history of myelosuppressive disorder  . Grave's disease    s/p I-131 ablation 1990s  . Hyperlipidemia   . Hypertension   . HYPOTHYROIDISM   . Left knee pain 03/26/2014  . Personal history of colonic adenoma 11/30/2002  . Preventative health care 09/04/2015  . Thrombophlebitis 10/16/2016  . Vitamin D deficiency     BP 138/72 (BP Location: Left Arm, Patient Position: Sitting, Cuff Size: Normal)   Pulse 72   Temp 97.6 F (36.4 C) (Oral)   Ht 5\' 5"  (1.651 m)   Wt 148 lb (67.1 kg)   SpO2 97%   BMI 24.63 kg/m  General: Awake, alert, appears stated age Heart: RRR Lungs: CTAB, normal respiratory effort, no accessory muscle usage Abd: BS+, soft, mild suprapubic ttp, ND, no masses or organomegaly MSK: No CVA tenderness, neg Lloyd's sign Psych: Age appropriate judgment and insight  Acute cystitis without hematuria - Plan: nitrofurantoin, macrocrystal-monohydrate, (MACROBID) 100 MG capsule  Orders as above. Stay hydrated. Seek immediate care if pt starts to develop fevers, new/worsening symptoms, uncontrollable N/V. F/u prn. The patient voiced understanding and agreement to the plan.  [place], DO 12/11/18 11:25 AM

## 2018-12-11 NOTE — Addendum Note (Signed)
Addended by: Sharon Seller B on: 12/11/2018 11:27 AM   Modules accepted: Orders

## 2018-12-11 NOTE — Patient Instructions (Signed)
Stay hydrated.   Warning signs/symptoms: Uncontrollable nausea/vomiting, fevers, worsening symptoms despite treatment, confusion.  Give us around 2 business days to get culture back to you.  Let us know if you need anything. 

## 2018-12-12 ENCOUNTER — Ambulatory Visit (INDEPENDENT_AMBULATORY_CARE_PROVIDER_SITE_OTHER): Payer: Medicare Other | Admitting: Vascular Surgery

## 2018-12-12 ENCOUNTER — Other Ambulatory Visit: Payer: Self-pay

## 2018-12-12 ENCOUNTER — Encounter: Payer: Self-pay | Admitting: Vascular Surgery

## 2018-12-12 ENCOUNTER — Telehealth: Payer: Self-pay | Admitting: Family Medicine

## 2018-12-12 VITALS — BP 161/72 | HR 61 | Temp 97.4°F | Resp 16 | Ht 65.0 in | Wt 147.0 lb

## 2018-12-12 DIAGNOSIS — I6523 Occlusion and stenosis of bilateral carotid arteries: Secondary | ICD-10-CM

## 2018-12-12 NOTE — Telephone Encounter (Signed)
Copied from [place] [id number]. Topic: Quick Communication - See Telephone Encounter >> Dec 12, 2018  4:17 PM Blase Mess A wrote: CRM for notification. See Telephone encounter for: 12/12/18.  Patient is calling to requesting the brand SYNTHROID 75 MCG tablet [[id number]] -sent a form sent a tier exception form. Please advise [phone]

## 2018-12-12 NOTE — Progress Notes (Signed)
Patient ID: Lindsay Hodges, female   DOB: [birth year], 63 y.o.   MRN: [id number]  Reason for Consult: Varicose Veins [place] [phone].  Carotid Stenosis. U/S 10/27/2018.)   Referred by Mosie Lukes, MD  Subjective:     HPI:  Lindsay Hodges is a 63 y.o. 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  She recently had neurologic symptoms of her left upper extremity in January and underwent work-up which did not reveal any stroke.  Carotid duplex demonstrated high-grade stenosis of the left minimal stenosis on the right.  She does not know of any previous history of stroke or TIA or amaurosis.  She has known about a left carotid stenosis in the past.  She does not take any antiplatelet agents has been intolerant of statins in the past.  No neurologic symptoms since the last episode in January.  Past Medical History:  Diagnosis Date  . Arthritis   . Bursitis of hip 08/02/15  . Carotid disease, bilateral ([place])    [place] 10/15: R 40-59%, L 50-69%, stable for years  . Elevated sed rate    remote history of myelosuppressive disorder  . Grave's disease    s/p I-131 ablation 1990s  . Hyperlipidemia   . Hypertension   . HYPOTHYROIDISM   . Left knee pain 03/26/2014  . Personal history of colonic adenoma 11/30/2002  . Preventative health care 09/04/2015  . Thrombophlebitis 10/16/2016  . Vitamin D deficiency    Family History  Problem Relation Age of Onset  . Hypertension Mother   . Arthritis Mother   . Hyperlipidemia Mother   . Stroke Mother        1st age 75, then recurrent 76  . Atrial fibrillation Mother   . Epilepsy Mother   . Arthritis Father   . Breast cancer Sister   . Cancer Sister        Recurrent Breast CA  . Atrial fibrillation Brother   . Von Willebrand disease Daughter   . Other Daughter        POTS  . Factor V Leiden deficiency Daughter   . Breast cancer Other   . Drug abuse  Maternal Aunt   . Diabetes Neg Hx   . Heart attack Neg Hx   . Colon cancer Neg Hx   . Esophageal cancer Neg Hx   . Rectal cancer Neg Hx   . Stomach cancer Neg Hx    Past Surgical History:  Procedure Laterality Date  . COLONOSCOPY    . Jaw surgery  2001  . OOPHORECTOMY Bilateral   . POLYPECTOMY    . VAGINAL HYSTERECTOMY  2009   vaginal hysterectomy    Short Social History:  Social History   Tobacco Use  . Smoking status: Former Smoker    Packs/day: 0.50    Years: 12.00    Pack years: 6.00    Last attempt to quit: 02/06/1993    Years since quitting: 25.8  . Smokeless tobacco: Never Used  Substance Use Topics  . Alcohol use: No    Allergies  Allergen Reactions  . Penicillins     REACTION: swelling/dyspnea  . Statins Other (See Comments)    Myalgias on several statins    Current Outpatient Medications  Medication Sig Dispense Refill  . losartan (COZAAR) 50 MG tablet TAKE 1 TABLET (50 MG TOTAL) BY MOUTH DAILY. 90 tablet 2  . SYNTHROID 75 MCG tablet  Take 1 tablet (75 mcg total) by mouth daily before breakfast. 90 tablet 1   No current facility-administered medications for this visit.     Review of Systems  Constitutional:  Constitutional negative. HENT: HENT negative.  Eyes: Eyes negative.  Respiratory: Respiratory negative.  Cardiovascular: Cardiovascular negative.  GI: Gastrointestinal negative.  Musculoskeletal: Musculoskeletal negative.  Skin: Skin negative.  Neurological: Positive for focal weakness.  Hematologic: Hematologic/lymphatic negative.  Psychiatric: Psychiatric negative.        Objective:  Objective   Vitals:   12/12/18 1031  BP: (!) 161/72  Pulse: 61  Resp: 16  Temp: (!) 97.4 F (36.3 C)  TempSrc: Oral  SpO2: 99%  Weight: 147 lb (66.7 kg)  Height: 5\' 5"  (1.651 m)   Body mass index is 24.46 kg/m.  Physical Exam Constitutional:      Appearance: Normal appearance.  HENT:     Head: Normocephalic.  Eyes:     Pupils: Pupils are  equal, round, and reactive to light.  Neck:     Musculoskeletal: Neck supple. No muscular tenderness.     Vascular: No carotid bruit.  Cardiovascular:     Rate and Rhythm: Normal rate.     Pulses: Normal pulses.          Carotid pulses are 2+ on the right side and 2+ on the left side.      Radial pulses are 2+ on the right side and 2+ on the left side.       Dorsalis pedis pulses are 2+ on the right side and 2+ on the left side.  Pulmonary:     Effort: Pulmonary effort is normal.  Abdominal:     General: Abdomen is flat.     Palpations: Abdomen is soft.  Skin:    General: Skin is warm and dry.     Capillary Refill: Capillary refill takes less than 2 seconds.  Neurological:     General: No focal deficit present.     Mental Status: She is alert.  Psychiatric:        Mood and Affect: Mood normal.        Behavior: Behavior normal.        Thought Content: Thought content normal.        Judgment: Judgment normal.     Data: I reviewed her previous carotid duplex which demonstrates less than 50% stenosis in the right and greater than 70% stenosis of the left ICA.  Left ICA peak systolic velocity 315 EDV of 44.     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  Right side is less than 50%.  She does not take any antiplatelet agents I have asked her to start aspirin.  She is intolerant of statins cannot take.  I will have her follow-up with repeat carotid duplex in 1 year.  We discussed the signs and symptoms of stroke which she demonstrates good understanding.     Waynetta Sandy MD [facility]

## 2018-12-13 LAB — URINE CULTURE
MICRO NUMBER:: 311649
SPECIMEN QUALITY:: ADEQUATE

## 2018-12-15 NOTE — Telephone Encounter (Signed)
I have not seen a tier exception form for this.

## 2018-12-25 ENCOUNTER — Telehealth: Payer: Self-pay | Admitting: Family Medicine

## 2018-12-25 ENCOUNTER — Encounter: Payer: Self-pay | Admitting: Family Medicine

## 2018-12-25 NOTE — Telephone Encounter (Signed)
Pt called and wanting to know the status of PA form and would like for some call her back .

## 2018-12-26 NOTE — Telephone Encounter (Signed)
I haven't received a PA form for this Pt.

## 2018-12-26 NOTE — Telephone Encounter (Signed)
I spoke with pt. Advised her per previous documentation, we have not received this form and I will contact her insurance to re-send Tier exception form. Pt states her previous endocrinologist told her she should only take the Name Brand for optimal control of her thyroid. Called [employer] @ [phone] and requested form be faxed to nurses station to Attn: Princess. Pt asks that we let her know when we have fax the form back to her insurance.

## 2018-12-26 NOTE — Telephone Encounter (Signed)
Form received, completed and faxed to [facility] at [phone]. Awaiting determination.

## 2018-12-26 NOTE — Telephone Encounter (Signed)
Form completed and faxed to [facility] at [phone]. Awaiting determination.

## 2019-01-01 ENCOUNTER — Encounter: Payer: Self-pay | Admitting: Family Medicine

## 2019-01-02 NOTE — Telephone Encounter (Signed)
Ivin Booty have you seen this paper work   Please advise

## 2019-01-05 NOTE — Telephone Encounter (Signed)
Thank you :)

## 2019-01-05 NOTE — Telephone Encounter (Signed)
Will route to correct office

## 2019-01-05 NOTE — Telephone Encounter (Signed)
Form was completed in entirety and faxed back.

## 2019-01-05 NOTE — Telephone Encounter (Signed)
Tier exception approved from Tier 4 to Tier 3.

## 2019-01-05 NOTE — Telephone Encounter (Signed)
Patient says [employer] faxed the Tier 1 exception form today for Dr. Randel Pigg to complete. It's not a PA, the script is covered, per pt. They just need the exception completed to approve the brand name and not generic. Patient ok with mychart correspondence to confirm receipt.

## 2019-03-09 ENCOUNTER — Other Ambulatory Visit: Payer: Self-pay

## 2019-03-09 ENCOUNTER — Ambulatory Visit (INDEPENDENT_AMBULATORY_CARE_PROVIDER_SITE_OTHER): Payer: Medicare Other | Admitting: Family Medicine

## 2019-03-09 VITALS — BP 127/58 | HR 62

## 2019-03-09 DIAGNOSIS — R531 Weakness: Secondary | ICD-10-CM

## 2019-03-09 DIAGNOSIS — E782 Mixed hyperlipidemia: Secondary | ICD-10-CM | POA: Diagnosis not present

## 2019-03-09 DIAGNOSIS — I6523 Occlusion and stenosis of bilateral carotid arteries: Secondary | ICD-10-CM

## 2019-03-09 DIAGNOSIS — I10 Essential (primary) hypertension: Secondary | ICD-10-CM | POA: Diagnosis not present

## 2019-03-09 DIAGNOSIS — M1990 Unspecified osteoarthritis, unspecified site: Secondary | ICD-10-CM

## 2019-03-09 DIAGNOSIS — G4701 Insomnia due to medical condition: Secondary | ICD-10-CM | POA: Diagnosis not present

## 2019-03-09 DIAGNOSIS — E039 Hypothyroidism, unspecified: Secondary | ICD-10-CM

## 2019-03-09 DIAGNOSIS — E559 Vitamin D deficiency, unspecified: Secondary | ICD-10-CM | POA: Diagnosis not present

## 2019-03-09 DIAGNOSIS — R739 Hyperglycemia, unspecified: Secondary | ICD-10-CM

## 2019-03-15 DIAGNOSIS — G4700 Insomnia, unspecified: Secondary | ICD-10-CM | POA: Insufficient documentation

## 2019-03-15 NOTE — Assessment & Plan Note (Signed)
Supplement and monitor 

## 2019-03-15 NOTE — Assessment & Plan Note (Signed)
hgba1c acceptable, minimize simple carbs. Increase exercise as tolerated.  

## 2019-03-15 NOTE — Assessment & Plan Note (Signed)
Left shoulder pain x 2 weeks no obvious injury. Encouraged topical treatments, alternate ice and heat. Tylenol tid and keep shoulder moving as tolerated. If no improvement notify office.

## 2019-03-15 NOTE — Assessment & Plan Note (Signed)
Mostly due to discomfort in left shoulder. Encouraged good sleep hygiene such as dark, quiet room. No blue/green glowing lights such as computer screens in bedroom. No alcohol or stimulants in evening. Cut down on caffeine as able. Regular exercise is helpful but not just prior to bed time.

## 2019-03-15 NOTE — Progress Notes (Signed)
Virtual Visit via Video Note  I connected with [name] on 03/15/19 at  8:20 AM EDT by a video enabled telemedicine application and verified that I am speaking with the correct person using two identifiers.  Location: Patient: home Provider: office   I discussed the limitations of evaluation and management by telemedicine and the availability of in person appointments. The patient expressed understanding and agreed to proceed. Princess Eulas Post CMA was able to get patient set up on virtual video visit     Subjective:    Patient ID: Lindsay Hodges, female    DOB: 23-Oct-1955, 63 y.o.   MRN: [id number]  No chief complaint on file.   HPI Patient is in today for evaluation of chronic medical concerns including hypertension, hyperlipidemia, hypothyroidism as well as some recent left shoulder pain result ing in decreased ROM and difficulty with ADLs/ also notes poor sleep due to pain and has not tried any medications on a regular basis to manage it. No fall or trauma. Also notes generalized sense of fatigue and weakness but no recent febrile illness or  Hospitalizations. Denies CP/palp/SOB/HA/congestion/fevers/GI or GU c/o. Taking meds as prescribed  Past Medical History:  Diagnosis Date  . Arthritis   . Bursitis of hip 08/02/15  . Carotid disease, bilateral ([place])    [place] 10/15: R 40-59%, L 50-69%, stable for years  . Elevated sed rate    remote history of myelosuppressive disorder  . Grave's disease    s/p I-131 ablation 1990s  . Hyperlipidemia   . Hypertension   . HYPOTHYROIDISM   . Left knee pain 03/26/2014  . Personal history of colonic adenoma 11/30/2002  . Preventative health care 09/04/2015  . Thrombophlebitis 10/16/2016  . Vitamin D deficiency     Past Surgical History:  Procedure Laterality Date  . COLONOSCOPY    . Jaw surgery  2001  . OOPHORECTOMY Bilateral   . POLYPECTOMY    . VAGINAL HYSTERECTOMY  2009   vaginal hysterectomy    Family History  Problem Relation  Age of Onset  . Hypertension Mother   . Arthritis Mother   . Hyperlipidemia Mother   . Stroke Mother        1st age 21, then recurrent 70  . Atrial fibrillation Mother   . Epilepsy Mother   . Arthritis Father   . Breast cancer Sister   . Cancer Sister        Recurrent Breast CA  . Atrial fibrillation Brother   . Von Willebrand disease Daughter   . Other Daughter        POTS  . Factor V Leiden deficiency Daughter   . Breast cancer Other   . Drug abuse Maternal Aunt   . Diabetes Neg Hx   . Heart attack Neg Hx   . Colon cancer Neg Hx   . Esophageal cancer Neg Hx   . Rectal cancer Neg Hx   . Stomach cancer Neg Hx     Social History   Socioeconomic History  . Marital status: Married    Spouse name: Not on file  . Number of children: Not on file  . Years of education: Not on file  . Highest education level: Not on file  Occupational History    Comment: [employer]  Social Needs  . Financial resource strain: Not on file  . Food insecurity    Worry: Not on file    Inability: Not on file  . Transportation needs    Medical:  Not on file    Non-medical: Not on file  Tobacco Use  . Smoking status: Former Smoker    Packs/day: 0.50    Years: 12.00    Pack years: 6.00    Quit date: 02/06/1993    Years since quitting: 26.1  . Smokeless tobacco: Never Used  Substance and Sexual Activity  . Alcohol use: No  . Drug use: No  . Sexual activity: Yes  Lifestyle  . Physical activity    Days per week: Not on file    Minutes per session: Not on file  . Stress: Not on file  Relationships  . Social Herbalist on phone: Not on file    Gets together: Not on file    Attends religious service: Not on file    Active member of club or organization: Not on file    Attends meetings of clubs or organizations: Not on file    Relationship status: Not on file  . Intimate partner violence    Fear of current or ex partner: Not on file    Emotionally abused: Not on file     Physically abused: Not on file    Forced sexual activity: Not on file  Other Topics Concern  . Not on file  Social History Narrative   pharmacy tech III    Outpatient Medications Prior to Visit  Medication Sig Dispense Refill  . losartan (COZAAR) 50 MG tablet TAKE 1 TABLET (50 MG TOTAL) BY MOUTH DAILY. 90 tablet 2  . SYNTHROID 75 MCG tablet Take 1 tablet (75 mcg total) by mouth daily before breakfast. 90 tablet 1   No facility-administered medications prior to visit.     Allergies  Allergen Reactions  . Penicillins     REACTION: swelling/dyspnea  . Statins Other (See Comments)    Myalgias on several statins    Review of Systems  Constitutional: Positive for malaise/fatigue. Negative for fever.  HENT: Negative for congestion.   Eyes: Negative for blurred vision.  Respiratory: Negative for shortness of breath.   Cardiovascular: Negative for chest pain, palpitations and leg swelling.  Gastrointestinal: Negative for abdominal pain, blood in stool and nausea.  Genitourinary: Negative for dysuria and frequency.  Musculoskeletal: Positive for joint pain. Negative for falls.  Skin: Negative for rash.  Neurological: Positive for weakness. Negative for dizziness, loss of consciousness and headaches.  Endo/Heme/Allergies: Negative for environmental allergies.  Psychiatric/Behavioral: Negative for depression. The patient has insomnia. The patient is not nervous/anxious.        Objective:    Physical Exam Constitutional:      Appearance: Normal appearance. She is not ill-appearing.  HENT:     Head: Normocephalic and atraumatic.     Nose: Nose normal.  Pulmonary:     Effort: Pulmonary effort is normal.     Breath sounds: Normal breath sounds.  Neurological:     Mental Status: She is alert and oriented to person, place, and time.  Psychiatric:        Mood and Affect: Mood normal.        Behavior: Behavior normal.     BP (!) 127/58   Pulse 62  Wt Readings from Last 3  Encounters:  12/12/18 147 lb (66.7 kg)  12/11/18 148 lb (67.1 kg)  11/03/18 148 lb (67.1 kg)    Diabetic Foot Exam - Simple   No data filed     Lab Results  Component Value Date   WBC 6.7 10/08/2018  HGB 13.6 10/08/2018   HCT 40.7 10/08/2018   PLT 212.0 10/08/2018   GLUCOSE 87 11/03/2018   CHOL 192 11/03/2018   TRIG 109.0 11/03/2018   HDL 49.90 11/03/2018   LDLCALC 120 (H) 11/03/2018   ALT 20 11/03/2018   AST 19 11/03/2018   NA 140 11/03/2018   K 4.2 11/03/2018   CL 105 11/03/2018   CREATININE 0.70 11/03/2018   BUN 15 11/03/2018   CO2 29 11/03/2018   TSH 0.97 11/03/2018   HGBA1C 6.4 11/03/2018    Lab Results  Component Value Date   TSH 0.97 11/03/2018   Lab Results  Component Value Date   WBC 6.7 10/08/2018   HGB 13.6 10/08/2018   HCT 40.7 10/08/2018   MCV 88.2 10/08/2018   PLT 212.0 10/08/2018   Lab Results  Component Value Date   NA 140 11/03/2018   K 4.2 11/03/2018   CO2 29 11/03/2018   GLUCOSE 87 11/03/2018   BUN 15 11/03/2018   CREATININE 0.70 11/03/2018   BILITOT 0.5 11/03/2018   ALKPHOS 75 11/03/2018   AST 19 11/03/2018   ALT 20 11/03/2018   PROT 6.9 11/03/2018   ALBUMIN 4.1 11/03/2018   CALCIUM 9.3 11/03/2018   GFR 84.02 11/03/2018   Lab Results  Component Value Date   CHOL 192 11/03/2018   Lab Results  Component Value Date   HDL 49.90 11/03/2018   Lab Results  Component Value Date   LDLCALC 120 (H) 11/03/2018   Lab Results  Component Value Date   TRIG 109.0 11/03/2018   Lab Results  Component Value Date   CHOLHDL 4 11/03/2018   Lab Results  Component Value Date   HGBA1C 6.4 11/03/2018       Assessment & Plan:   Problem List Items Addressed This Visit    Hypothyroidism   Relevant Orders   TSH   Hyperlipidemia   Relevant Orders   Lipid panel   Hypertension    Well controlled, no changes to meds. Encouraged heart healthy diet such as the DASH diet and exercise as tolerated.       Relevant Orders   CBC    Comprehensive metabolic panel   TSH   Arthritis    Left shoulder pain x 2 weeks no obvious injury. Encouraged topical treatments, alternate ice and heat. Tylenol tid and keep shoulder moving as tolerated. If no improvement notify office.      Vitamin D deficiency    Supplement and monitor      Hyperglycemia    hgba1c acceptable, minimize simple carbs. Increase exercise as tolerated.      Insomnia    Mostly due to discomfort in left shoulder. Encouraged good sleep hygiene such as dark, quiet room. No blue/green glowing lights such as computer screens in bedroom. No alcohol or stimulants in evening. Cut down on caffeine as able. Regular exercise is helpful but not just prior to bed time.        Other Visit Diagnoses    Weakness    -  Primary   Relevant Orders   CK (Creatine Kinase)      I am having Quantisha Shubert maintain her Synthroid and losartan.  No orders of the defined types were placed in this encounter.    I discussed the assessment and treatment plan with the patient. The patient was provided an opportunity to ask questions and all were answered. The patient agreed with the plan and demonstrated an understanding of the instructions.   The  patient was advised to call back or seek an in-person evaluation if the symptoms worsen or if the condition fails to improve as anticipated.  I provided 25 minutes of non-face-to-face time during this encounter.   Penni Homans, MD

## 2019-03-15 NOTE — Assessment & Plan Note (Signed)
Well controlled, no changes to meds. Encouraged heart healthy diet such as the DASH diet and exercise as tolerated.  °

## 2019-03-16 ENCOUNTER — Other Ambulatory Visit: Payer: Self-pay

## 2019-03-16 ENCOUNTER — Other Ambulatory Visit (INDEPENDENT_AMBULATORY_CARE_PROVIDER_SITE_OTHER): Payer: Medicare Other

## 2019-03-16 DIAGNOSIS — E782 Mixed hyperlipidemia: Secondary | ICD-10-CM

## 2019-03-16 DIAGNOSIS — E039 Hypothyroidism, unspecified: Secondary | ICD-10-CM

## 2019-03-16 DIAGNOSIS — I10 Essential (primary) hypertension: Secondary | ICD-10-CM | POA: Diagnosis not present

## 2019-03-16 DIAGNOSIS — R531 Weakness: Secondary | ICD-10-CM | POA: Diagnosis not present

## 2019-03-16 LAB — LIPID PANEL
Cholesterol: 189 mg/dL (ref 0–200)
HDL: 59.7 mg/dL (ref >39.00)
LDL Cholesterol: 106 mg/dL — ABNORMAL HIGH (ref 0–99)
NonHDL: 129.53
Total CHOL/HDL Ratio: 3
Triglycerides: 118 mg/dL (ref 0.0–149.0)
VLDL: 23.6 mg/dL (ref 0.0–40.0)

## 2019-03-16 LAB — COMPREHENSIVE METABOLIC PANEL
ALT: 120 U/L — ABNORMAL HIGH (ref 0–35)
AST: 100 U/L — ABNORMAL HIGH (ref 0–37)
Albumin: 4 g/dL (ref 3.5–5.2)
Alkaline Phosphatase: 76 U/L (ref 39–117)
BUN: 15 mg/dL (ref 6–23)
CO2: 27 mEq/L (ref 19–32)
Calcium: 8.9 mg/dL (ref 8.4–10.5)
Chloride: 104 mEq/L (ref 96–112)
Creatinine, Ser: 0.7 mg/dL (ref 0.40–1.20)
GFR: 83.93 mL/min (ref >60.00)
Glucose, Bld: 76 mg/dL (ref 70–99)
Potassium: 4 mEq/L (ref 3.5–5.1)
Sodium: 138 mEq/L (ref 135–145)
Total Bilirubin: 0.5 mg/dL (ref 0.2–1.2)
Total Protein: 6.7 g/dL (ref 6.0–8.3)

## 2019-03-16 LAB — CBC
HCT: 35.5 % — ABNORMAL LOW (ref 36.0–46.0)
Hemoglobin: 11.5 g/dL — ABNORMAL LOW (ref 12.0–15.0)
MCHC: 32.4 g/dL (ref 30.0–36.0)
MCV: 90.9 fl (ref 78.0–100.0)
Platelets: 201 10*3/uL (ref 150.0–400.0)
RBC: 3.9 Mil/uL (ref 3.87–5.11)
RDW: 13.6 % (ref 11.5–15.5)
WBC: 6.7 10*3/uL (ref 4.0–10.5)

## 2019-03-16 LAB — TSH: TSH: 1.27 u[IU]/mL (ref 0.35–4.50)

## 2019-03-16 LAB — CK: Total CK: 72 U/L (ref 7–177)

## 2019-03-23 ENCOUNTER — Encounter: Payer: Self-pay | Admitting: Family Medicine

## 2019-03-23 NOTE — Addendum Note (Signed)
Addended by: Magdalene Molly A on: 03/23/2019 02:28 PM   Modules accepted: Orders

## 2019-03-23 NOTE — Addendum Note (Signed)
Addended by: Magdalene Molly A on: 03/23/2019 02:30 PM   Modules accepted: Orders

## 2019-04-06 ENCOUNTER — Other Ambulatory Visit: Payer: Self-pay

## 2019-04-06 ENCOUNTER — Other Ambulatory Visit (INDEPENDENT_AMBULATORY_CARE_PROVIDER_SITE_OTHER): Payer: Medicare Other

## 2019-04-06 DIAGNOSIS — R531 Weakness: Secondary | ICD-10-CM

## 2019-04-06 DIAGNOSIS — I10 Essential (primary) hypertension: Secondary | ICD-10-CM

## 2019-04-06 LAB — COMPREHENSIVE METABOLIC PANEL
ALT: 104 U/L — ABNORMAL HIGH (ref 0–35)
AST: 86 U/L — ABNORMAL HIGH (ref 0–37)
Albumin: 4 g/dL (ref 3.5–5.2)
Alkaline Phosphatase: 88 U/L (ref 39–117)
BUN: 13 mg/dL (ref 6–23)
CO2: 27 mEq/L (ref 19–32)
Calcium: 9 mg/dL (ref 8.4–10.5)
Chloride: 106 mEq/L (ref 96–112)
Creatinine, Ser: 0.7 mg/dL (ref 0.40–1.20)
GFR: 83.91 mL/min (ref >60.00)
Glucose, Bld: 83 mg/dL (ref 70–99)
Potassium: 4.4 mEq/L (ref 3.5–5.1)
Sodium: 140 mEq/L (ref 135–145)
Total Bilirubin: 0.5 mg/dL (ref 0.2–1.2)
Total Protein: 6.8 g/dL (ref 6.0–8.3)

## 2019-04-07 LAB — HEPATITIS PANEL, ACUTE
Hep A IgM: NONREACTIVE
Hep B C IgM: NONREACTIVE
Hepatitis B Surface Ag: NONREACTIVE
Hepatitis C Ab: NONREACTIVE
SIGNAL TO CUT-OFF: 0.06 (ref <1.00)

## 2019-04-07 LAB — SAR COV2 SEROLOGY (COVID19)AB(IGG),IA: SARS CoV2 AB IGG: NEGATIVE

## 2019-05-04 ENCOUNTER — Ambulatory Visit (INDEPENDENT_AMBULATORY_CARE_PROVIDER_SITE_OTHER): Payer: Medicare Other | Admitting: Family Medicine

## 2019-05-04 ENCOUNTER — Encounter: Payer: Self-pay | Admitting: Family Medicine

## 2019-05-04 ENCOUNTER — Other Ambulatory Visit: Payer: Self-pay

## 2019-05-04 VITALS — BP 133/51 | HR 60 | Temp 98.0°F | Resp 16 | Ht 65.0 in | Wt 143.6 lb

## 2019-05-04 DIAGNOSIS — Z0000 Encounter for general adult medical examination without abnormal findings: Secondary | ICD-10-CM | POA: Diagnosis not present

## 2019-05-04 DIAGNOSIS — E559 Vitamin D deficiency, unspecified: Secondary | ICD-10-CM

## 2019-05-04 DIAGNOSIS — E039 Hypothyroidism, unspecified: Secondary | ICD-10-CM

## 2019-05-04 DIAGNOSIS — E7800 Pure hypercholesterolemia, unspecified: Secondary | ICD-10-CM | POA: Diagnosis not present

## 2019-05-04 DIAGNOSIS — R7989 Other specified abnormal findings of blood chemistry: Secondary | ICD-10-CM

## 2019-05-04 DIAGNOSIS — I10 Essential (primary) hypertension: Secondary | ICD-10-CM

## 2019-05-04 DIAGNOSIS — R748 Abnormal levels of other serum enzymes: Secondary | ICD-10-CM | POA: Diagnosis not present

## 2019-05-04 DIAGNOSIS — R739 Hyperglycemia, unspecified: Secondary | ICD-10-CM

## 2019-05-04 DIAGNOSIS — R945 Abnormal results of liver function studies: Secondary | ICD-10-CM | POA: Diagnosis not present

## 2019-05-04 DIAGNOSIS — I6523 Occlusion and stenosis of bilateral carotid arteries: Secondary | ICD-10-CM | POA: Diagnosis not present

## 2019-05-04 DIAGNOSIS — R109 Unspecified abdominal pain: Secondary | ICD-10-CM | POA: Diagnosis not present

## 2019-05-04 DIAGNOSIS — Z23 Encounter for immunization: Secondary | ICD-10-CM

## 2019-05-04 NOTE — Assessment & Plan Note (Signed)
On Levothyroxine, continue to monitor 

## 2019-05-04 NOTE — Assessment & Plan Note (Signed)
hgba1c acceptable, minimize simple carbs. Increase exercise as tolerated. Continue current meds 

## 2019-05-04 NOTE — Patient Instructions (Signed)
Pulse Oximeter  Vitals weekly Hypertension, Adult High blood pressure (hypertension) is when the force of blood pumping through the arteries is too strong. The arteries are the blood vessels that carry blood from the heart throughout the body. Hypertension forces the heart to work harder to pump blood and may cause arteries to become narrow or stiff. Untreated or uncontrolled hypertension can cause a heart attack, heart failure, a stroke, kidney disease, and other problems. A blood pressure reading consists of a higher number over a lower number. Ideally, your blood pressure should be below 120/80. The first ("top") number is called the systolic pressure. It is a measure of the pressure in your arteries as your heart beats. The second ("bottom") number is called the diastolic pressure. It is a measure of the pressure in your arteries as the heart relaxes. What are the causes? The exact cause of this condition is not known. There are some conditions that result in or are related to high blood pressure. What increases the risk? Some risk factors for high blood pressure are under your control. The following factors may make you more likely to develop this condition:  Smoking.  Having type 2 diabetes mellitus, high cholesterol, or both.  Not getting enough exercise or physical activity.  Being overweight.  Having too much fat, sugar, calories, or salt (sodium) in your diet.  Drinking too much alcohol. Some risk factors for high blood pressure may be difficult or impossible to change. Some of these factors include:  Having chronic kidney disease.  Having a family history of high blood pressure.  Age. Risk increases with age.  Race. You may be at higher risk if you are African American.  Gender. Men are at higher risk than women before age 46. After age 32, women are at higher risk than men.  Having obstructive sleep apnea.  Stress. What are the signs or symptoms? High blood pressure  may not cause symptoms. Very high blood pressure (hypertensive crisis) may cause:  Headache.  Anxiety.  Shortness of breath.  Nosebleed.  Nausea and vomiting.  Vision changes.  Severe chest pain.  Seizures. How is this diagnosed? This condition is diagnosed by measuring your blood pressure while you are seated, with your arm resting on a flat surface, your legs uncrossed, and your feet flat on the floor. The cuff of the blood pressure monitor will be placed directly against the skin of your upper arm at the level of your heart. It should be measured at least twice using the same arm. Certain conditions can cause a difference in blood pressure between your right and left arms. Certain factors can cause blood pressure readings to be lower or higher than normal for a short period of time:  When your blood pressure is higher when you are in a health care provider's office than when you are at home, this is called white coat hypertension. Most people with this condition do not need medicines.  When your blood pressure is higher at home than when you are in a health care provider's office, this is called masked hypertension. Most people with this condition may need medicines to control blood pressure. If you have a high blood pressure reading during one visit or you have normal blood pressure with other risk factors, you may be asked to:  Return on a different day to have your blood pressure checked again.  Monitor your blood pressure at home for 1 week or longer. If you are diagnosed with hypertension, you  may have other blood or imaging tests to help your health care provider understand your overall risk for other conditions. How is this treated? This condition is treated by making healthy lifestyle changes, such as eating healthy foods, exercising more, and reducing your alcohol intake. Your health care provider may prescribe medicine if lifestyle changes are not enough to get your blood  pressure under control, and if:  Your systolic blood pressure is above 130.  Your diastolic blood pressure is above 80. Your personal target blood pressure may vary depending on your medical conditions, your age, and other factors. Follow these instructions at home: Eating and drinking   Eat a diet that is high in fiber and potassium, and low in sodium, added sugar, and fat. An example eating plan is called the DASH (Dietary Approaches to Stop Hypertension) diet. To eat this way: ? Eat plenty of fresh fruits and vegetables. Try to fill one half of your plate at each meal with fruits and vegetables. ? Eat whole grains, such as whole-wheat pasta, brown rice, or whole-grain bread. Fill about one fourth of your plate with whole grains. ? Eat or drink low-fat dairy products, such as skim milk or low-fat yogurt. ? Avoid fatty cuts of meat, processed or cured meats, and poultry with skin. Fill about one fourth of your plate with lean proteins, such as fish, chicken without skin, beans, eggs, or tofu. ? Avoid pre-made and processed foods. These tend to be higher in sodium, added sugar, and fat.  Reduce your daily sodium intake. Most people with hypertension should eat less than 1,500 mg of sodium a day.  Do not drink alcohol if: ? Your health care provider tells you not to drink. ? You are pregnant, may be pregnant, or are planning to become pregnant.  If you drink alcohol: ? Limit how much you use to:  0-1 drink a day for women.  0-2 drinks a day for men. ? Be aware of how much alcohol is in your drink. In the [place] one drink equals one 12 oz bottle of beer (355 mL), one 5 oz glass of wine (148 mL), or one 1 oz glass of hard liquor (44 mL). Lifestyle   Work with your health care provider to maintain a healthy body weight or to lose weight. Ask what an ideal weight is for you.  Get at least 30 minutes of exercise most days of the week. Activities may include walking, swimming, or biking.   Include exercise to strengthen your muscles (resistance exercise), such as Pilates or lifting weights, as part of your weekly exercise routine. Try to do these types of exercises for 30 minutes at least 3 days a week.  Do not use any products that contain nicotine or tobacco, such as cigarettes, e-cigarettes, and chewing tobacco. If you need help quitting, ask your health care provider.  Monitor your blood pressure at home as told by your health care provider.  Keep all follow-up visits as told by your health care provider. This is important. Medicines  Take over-the-counter and prescription medicines only as told by your health care provider. Follow directions carefully. Blood pressure medicines must be taken as prescribed.  Do not skip doses of blood pressure medicine. Doing this puts you at risk for problems and can make the medicine less effective.  Ask your health care provider about side effects or reactions to medicines that you should watch for. Contact a health care provider if you:  Think you are having a  reaction to a medicine you are taking.  Have headaches that keep coming back (recurring).  Feel dizzy.  Have swelling in your ankles.  Have trouble with your vision. Get help right away if you:  Develop a severe headache or confusion.  Have unusual weakness or numbness.  Feel faint.  Have severe pain in your chest or abdomen.  Vomit repeatedly.  Have trouble breathing. Summary  Hypertension is when the force of blood pumping through your arteries is too strong. If this condition is not controlled, it may put you at risk for serious complications.  Your personal target blood pressure may vary depending on your medical conditions, your age, and other factors. For most people, a normal blood pressure is less than 120/80.  Hypertension is treated with lifestyle changes, medicines, or a combination of both. Lifestyle changes include losing weight, eating a  healthy, low-sodium diet, exercising more, and limiting alcohol. This information is not intended to replace advice given to you by your health care provider. Make sure you discuss any questions you have with your health care provider. Document Released: 09/17/2005 Document Revised: 05/28/2018 Document Reviewed: 05/28/2018 Elsevier Patient Education  2020 [employer].

## 2019-05-04 NOTE — Assessment & Plan Note (Signed)
Well controlled, no changes to meds. Encouraged heart healthy diet such as the DASH diet and exercise as tolerated.  °

## 2019-05-04 NOTE — Assessment & Plan Note (Signed)
With some intermittent right sided abdominal pain. Acute hepatitis panel negative. Proceed with abdominal ultrasound. Minimize fatty and high carbohydrate foods.

## 2019-05-04 NOTE — Assessment & Plan Note (Signed)
Agrees to [employer] shot today.

## 2019-05-04 NOTE — Assessment & Plan Note (Signed)
Supplement and monitor 

## 2019-05-04 NOTE — Progress Notes (Signed)
Subjective:    Patient ID: Lindsay Hodges, female    DOB: [birth year], 63 y.o.   MRN: [id number]  Chief Complaint  Patient presents with  . Follow-up    HPI Patient is in today for follow up on chronic medical concerns including hypertension, hyperlipidemia. Elevated liver markers and more. She feels well today. She is trying to maintain a heart healthy diet and exercises regularly by walking and rowing. No recent febrile illness or hospitalizations. No polyuria or polydipsia. Denies CP/palp/SOB/HA/congestion/fevers/GI or GU c/o. Taking meds as prescribed. Does note occasional right sided abdominal discomfort but can find no pattern as to when it might occur.   Past Medical History:  Diagnosis Date  . Arthritis   . Bursitis of hip 08/02/15  . Carotid disease, bilateral ([place])    [place] 10/15: R 40-59%, L 50-69%, stable for years  . Elevated sed rate    remote history of myelosuppressive disorder  . Grave's disease    s/p I-131 ablation 1990s  . Hyperlipidemia   . Hypertension   . HYPOTHYROIDISM   . Left knee pain 03/26/2014  . Personal history of colonic adenoma 11/30/2002  . Preventative health care 09/04/2015  . Thrombophlebitis 10/16/2016  . Vitamin D deficiency     Past Surgical History:  Procedure Laterality Date  . COLONOSCOPY    . Jaw surgery  2001  . OOPHORECTOMY Bilateral   . POLYPECTOMY    . VAGINAL HYSTERECTOMY  2009   vaginal hysterectomy    Family History  Problem Relation Age of Onset  . Hypertension Mother   . Arthritis Mother   . Hyperlipidemia Mother   . Stroke Mother        1st age 60, then recurrent 43  . Atrial fibrillation Mother   . Epilepsy Mother   . Arthritis Father   . Breast cancer Sister   . Cancer Sister        Recurrent Breast CA  . Atrial fibrillation Brother   . Von Willebrand disease Daughter   . Other Daughter        POTS  . Factor V Leiden deficiency Daughter   . Breast cancer Other   . Drug abuse Maternal Aunt   . Diabetes Neg Hx    . Heart attack Neg Hx   . Colon cancer Neg Hx   . Esophageal cancer Neg Hx   . Rectal cancer Neg Hx   . Stomach cancer Neg Hx     Social History   Socioeconomic History  . Marital status: Married    Spouse name: Not on file  . Number of children: Not on file  . Years of education: Not on file  . Highest education level: Not on file  Occupational History    Comment: [employer]  Social Needs  . Financial resource strain: Not on file  . Food insecurity    Worry: Not on file    Inability: Not on file  . Transportation needs    Medical: Not on file    Non-medical: Not on file  Tobacco Use  . Smoking status: Former Smoker    Packs/day: 0.50    Years: 12.00    Pack years: 6.00    Quit date: 02/06/1993    Years since quitting: 26.2  . Smokeless tobacco: Never Used  Substance and Sexual Activity  . Alcohol use: No  . Drug use: No  . Sexual activity: Yes  Lifestyle  . Physical activity    Days per  week: Not on file    Minutes per session: Not on file  . Stress: Not on file  Relationships  . Social Herbalist on phone: Not on file    Gets together: Not on file    Attends religious service: Not on file    Active member of club or organization: Not on file    Attends meetings of clubs or organizations: Not on file    Relationship status: Not on file  . Intimate partner violence    Fear of current or ex partner: Not on file    Emotionally abused: Not on file    Physically abused: Not on file    Forced sexual activity: Not on file  Other Topics Concern  . Not on file  Social History Narrative   pharmacy tech III    Outpatient Medications Prior to Visit  Medication Sig Dispense Refill  . losartan (COZAAR) 50 MG tablet TAKE 1 TABLET (50 MG TOTAL) BY MOUTH DAILY. 90 tablet 2  . SYNTHROID 75 MCG tablet Take 1 tablet (75 mcg total) by mouth daily before breakfast. 90 tablet 1   No facility-administered medications prior to visit.     Allergies   Allergen Reactions  . Penicillins     REACTION: swelling/dyspnea  . Statins Other (See Comments)    Myalgias on several statins    Review of Systems  Constitutional: Negative for fever and malaise/fatigue.  HENT: Negative for congestion.   Eyes: Negative for blurred vision.  Respiratory: Negative for shortness of breath.   Cardiovascular: Negative for chest pain, palpitations and leg swelling.  Gastrointestinal: Positive for abdominal pain. Negative for blood in stool and nausea.  Genitourinary: Negative for dysuria and frequency.  Musculoskeletal: Negative for falls.  Skin: Negative for rash.  Neurological: Negative for dizziness, loss of consciousness and headaches.  Endo/Heme/Allergies: Negative for environmental allergies.  Psychiatric/Behavioral: Negative for depression. The patient is not nervous/anxious.        Objective:    Physical Exam Vitals signs and nursing note reviewed.  Constitutional:      General: She is not in acute distress.    Appearance: She is well-developed.  HENT:     Head: Normocephalic and atraumatic.     Nose: Nose normal.  Eyes:     General:        Right eye: No discharge.        Left eye: No discharge.  Neck:     Musculoskeletal: Normal range of motion and neck supple.  Cardiovascular:     Rate and Rhythm: Normal rate and regular rhythm.     Heart sounds: No murmur.  Pulmonary:     Effort: Pulmonary effort is normal.     Breath sounds: Normal breath sounds.  Abdominal:     General: Bowel sounds are normal.     Palpations: Abdomen is soft.     Tenderness: There is no abdominal tenderness.  Skin:    General: Skin is warm and dry.  Neurological:     Mental Status: She is alert and oriented to person, place, and time.     BP (!) 133/51   Pulse 60   Temp 98 F (36.7 C) (Oral)   Resp 16   Ht '5\' 5"'  (1.651 m)   Wt 143 lb 9.6 oz (65.1 kg)   SpO2 99%   BMI 23.90 kg/m  Wt Readings from Last 3 Encounters:  05/04/19 143 lb 9.6 oz  (65.1 kg)  12/12/18 147  lb (66.7 kg)  12/11/18 148 lb (67.1 kg)    Diabetic Foot Exam - Simple   No data filed     Lab Results  Component Value Date   WBC 6.7 03/16/2019   HGB 11.5 (L) 03/16/2019   HCT 35.5 (L) 03/16/2019   PLT 201.0 03/16/2019   GLUCOSE 83 04/06/2019   CHOL 189 03/16/2019   TRIG 118.0 03/16/2019   HDL 59.70 03/16/2019   LDLCALC 106 (H) 03/16/2019   ALT 104 (H) 04/06/2019   AST 86 (H) 04/06/2019   NA 140 04/06/2019   K 4.4 04/06/2019   CL 106 04/06/2019   CREATININE 0.70 04/06/2019   BUN 13 04/06/2019   CO2 27 04/06/2019   TSH 1.27 03/16/2019   HGBA1C 6.4 11/03/2018    Lab Results  Component Value Date   TSH 1.27 03/16/2019   Lab Results  Component Value Date   WBC 6.7 03/16/2019   HGB 11.5 (L) 03/16/2019   HCT 35.5 (L) 03/16/2019   MCV 90.9 03/16/2019   PLT 201.0 03/16/2019   Lab Results  Component Value Date   NA 140 04/06/2019   K 4.4 04/06/2019   CO2 27 04/06/2019   GLUCOSE 83 04/06/2019   BUN 13 04/06/2019   CREATININE 0.70 04/06/2019   BILITOT 0.5 04/06/2019   ALKPHOS 88 04/06/2019   AST 86 (H) 04/06/2019   ALT 104 (H) 04/06/2019   PROT 6.8 04/06/2019   ALBUMIN 4.0 04/06/2019   CALCIUM 9.0 04/06/2019   GFR 83.91 04/06/2019   Lab Results  Component Value Date   CHOL 189 03/16/2019   Lab Results  Component Value Date   HDL 59.70 03/16/2019   Lab Results  Component Value Date   LDLCALC 106 (H) 03/16/2019   Lab Results  Component Value Date   TRIG 118.0 03/16/2019   Lab Results  Component Value Date   CHOLHDL 3 03/16/2019   Lab Results  Component Value Date   HGBA1C 6.4 11/03/2018       Assessment & Plan:   Problem List Items Addressed This Visit    Hypothyroidism    On Levothyroxine, continue to monitor      Hyperlipidemia    Encouraged heart healthy diet, increase exercise, avoid trans fats, consider a krill oil cap daily      Hypertension    Well controlled, no changes to meds. Encouraged heart  healthy diet such as the DASH diet and exercise as tolerated.       Vitamin D deficiency    Supplement and monitor      Preventative health care    Agrees to Prevnar shot today.       Hyperglycemia    hgba1c acceptable, minimize simple carbs. Increase exercise as tolerated. Continue current meds      Elevated liver enzymes    With some intermittent right sided abdominal pain. Acute hepatitis panel negative. Proceed with abdominal ultrasound. Minimize fatty and high carbohydrate foods.       Other Visit Diagnoses    Elevated liver function tests    -  Primary   Relevant Orders   US Abdomen Complete   Comp Met (CMET)   Abdominal pain, unspecified abdominal location       Relevant Orders   US Abdomen Complete      I am having Lindsay Hodges maintain her Synthroid and losartan.  No orders of the defined types were placed in this encounter.    Penni Homans, MD

## 2019-05-04 NOTE — Assessment & Plan Note (Signed)
Encouraged heart healthy diet, increase exercise, avoid trans fats, consider a krill oil cap daily 

## 2019-05-12 ENCOUNTER — Ambulatory Visit (INDEPENDENT_AMBULATORY_CARE_PROVIDER_SITE_OTHER): Payer: Medicare Other

## 2019-05-12 ENCOUNTER — Other Ambulatory Visit: Payer: Self-pay

## 2019-05-12 DIAGNOSIS — R109 Unspecified abdominal pain: Secondary | ICD-10-CM

## 2019-05-12 DIAGNOSIS — R945 Abnormal results of liver function studies: Secondary | ICD-10-CM | POA: Diagnosis not present

## 2019-05-12 DIAGNOSIS — R7989 Other specified abnormal findings of blood chemistry: Secondary | ICD-10-CM

## 2019-05-24 DIAGNOSIS — Z23 Encounter for immunization: Secondary | ICD-10-CM | POA: Diagnosis not present

## 2019-06-25 ENCOUNTER — Other Ambulatory Visit: Payer: Self-pay | Admitting: Family Medicine

## 2019-06-29 ENCOUNTER — Other Ambulatory Visit: Payer: Self-pay

## 2019-06-29 ENCOUNTER — Other Ambulatory Visit (INDEPENDENT_AMBULATORY_CARE_PROVIDER_SITE_OTHER): Payer: Medicare Other

## 2019-06-29 DIAGNOSIS — R945 Abnormal results of liver function studies: Secondary | ICD-10-CM | POA: Diagnosis not present

## 2019-06-29 DIAGNOSIS — R7989 Other specified abnormal findings of blood chemistry: Secondary | ICD-10-CM

## 2019-06-29 LAB — COMPREHENSIVE METABOLIC PANEL
ALT: 49 U/L — ABNORMAL HIGH (ref 0–35)
AST: 45 U/L — ABNORMAL HIGH (ref 0–37)
Albumin: 3.9 g/dL (ref 3.5–5.2)
Alkaline Phosphatase: 107 U/L (ref 39–117)
BUN: 15 mg/dL (ref 6–23)
CO2: 29 mEq/L (ref 19–32)
Calcium: 9.2 mg/dL (ref 8.4–10.5)
Chloride: 105 mEq/L (ref 96–112)
Creatinine, Ser: 0.76 mg/dL (ref 0.40–1.20)
GFR: 76.26 mL/min (ref >60.00)
Glucose, Bld: 83 mg/dL (ref 70–99)
Potassium: 4.2 mEq/L (ref 3.5–5.1)
Sodium: 140 mEq/L (ref 135–145)
Total Bilirubin: 0.5 mg/dL (ref 0.2–1.2)
Total Protein: 6.8 g/dL (ref 6.0–8.3)

## 2019-08-24 DIAGNOSIS — I10 Essential (primary) hypertension: Secondary | ICD-10-CM

## 2019-08-24 MED ORDER — LOSARTAN POTASSIUM 100 MG PO TABS: 100.0000 mg | ORAL_TABLET | Freq: Every day | ORAL | 3 refills | Status: DC

## 2019-08-24 NOTE — Telephone Encounter (Signed)
Increase losartan to 100 mg daily; bmet one week; follow BP  [place] script sent to the pharmacy and Lab orders mailed to the pt

## 2019-09-03 DIAGNOSIS — D3132 Benign neoplasm of left choroid: Secondary | ICD-10-CM | POA: Diagnosis not present

## 2019-09-03 DIAGNOSIS — H00012 Hordeolum externum right lower eyelid: Secondary | ICD-10-CM | POA: Diagnosis not present

## 2019-09-08 DIAGNOSIS — I10 Essential (primary) hypertension: Secondary | ICD-10-CM | POA: Diagnosis not present

## 2019-09-09 LAB — BASIC METABOLIC PANEL
BUN/Creatinine Ratio: 19 (ref 12–28)
BUN: 13 mg/dL (ref 8–27)
CO2: 25 mmol/L (ref 20–29)
Calcium: 9.3 mg/dL (ref 8.7–10.3)
Chloride: 104 mmol/L (ref 96–106)
Creatinine, Ser: 0.68 mg/dL (ref 0.57–1.00)
GFR calc Af Amer: 106 mL/min/{1.73_m2} (ref >59)
GFR calc non Af Amer: 92 mL/min/{1.73_m2} (ref >59)
Glucose: 110 mg/dL — ABNORMAL HIGH (ref 65–99)
Potassium: 3.8 mmol/L (ref 3.5–5.2)
Sodium: 141 mmol/L (ref 134–144)

## 2019-09-18 ENCOUNTER — Other Ambulatory Visit: Payer: Self-pay | Admitting: Family Medicine

## 2019-09-18 NOTE — Telephone Encounter (Signed)
Last OV 05/04/19 Last refill 06/26/19 #90/0 Next OV 11/05/19

## 2019-10-08 NOTE — Progress Notes (Signed)
HPI: FU cerebrovascular disease. Echocardiogram in 2004 showed normal LV function. ABIs 6/16 normal.Nuclear study January 2017 showed ejection fraction 58% and normal perfusion. Abdominal ultrasound June 2018 showed no aneurysm.  Carotid Dopplers January 2020 showed less than 50% stenosis on the right and greater than 70% stenosis on the left.  Seen by vascular surgery and follow-up carotid Dopplers scheduled for March 2021.  Since last seen,the patient denies any dyspnea on exertion, orthopnea, PND, pedal edema, palpitations, syncope or chest pain.   Current Outpatient Medications  Medication Sig Dispense Refill  . aspirin EC 81 MG tablet Take 81 mg by mouth daily.    Marland Kitchen losartan (COZAAR) 100 MG tablet Take 1 tablet (100 mg total) by mouth daily. 90 tablet 3  . SYNTHROID 75 MCG tablet TAKE 1 TABLET BY MOUTH ONCE DAILY BEFORE  BREAKFAST 90 tablet 0   No current facility-administered medications for this visit.     Past Medical History:  Diagnosis Date  . Arthritis   . Bursitis of hip 08/02/15  . Carotid disease, bilateral ([place])    [place] 10/15: R 40-59%, L 50-69%, stable for years  . Elevated sed rate    remote history of myelosuppressive disorder  . Grave's disease    s/p I-131 ablation 1990s  . Hyperlipidemia   . Hypertension   . HYPOTHYROIDISM   . Left knee pain 03/26/2014  . Personal history of colonic adenoma 11/30/2002  . Preventative health care 09/04/2015  . Thrombophlebitis 10/16/2016  . Vitamin D deficiency     Past Surgical History:  Procedure Laterality Date  . COLONOSCOPY    . Jaw surgery  2001  . OOPHORECTOMY Bilateral   . POLYPECTOMY    . VAGINAL HYSTERECTOMY  2009   vaginal hysterectomy    Social History   Socioeconomic History  . Marital status: Married    Spouse name: Not on file  . Number of children: Not on file  . Years of education: Not on file  . Highest education level: Not on file  Occupational History    Comment: [employer]   Tobacco Use  . Smoking status: Former Smoker    Packs/day: 0.50    Years: 12.00    Pack years: 6.00    Quit date: 02/06/1993    Years since quitting: 26.7  . Smokeless tobacco: Never Used  Substance and Sexual Activity  . Alcohol use: No  . Drug use: No  . Sexual activity: Yes  Other Topics Concern  . 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 Difficulty of Paying Living Expenses: Not on file  Food Insecurity:   . Worried About Charity fundraiser in the Last Year: Not on file  . Ran Out of Food in the Last Year: Not on file  Transportation Needs:   . Lack of Transportation (Medical): Not on file  . Lack of Transportation (Non-Medical): Not on file  Physical Activity:   . Days of Exercise per Week: Not on file  . Minutes of Exercise per Session: Not on file  Stress:   . Feeling of Stress : Not on file  Social Connections:   . Frequency of Communication with Friends and Family: Not on file  . Frequency of Social Gatherings with Friends and Family: Not on file  . Attends Religious Services: Not on file  . Active Member of Clubs  or Organizations: Not on file  . Attends Archivist Meetings: Not on file  . Marital Status: Not on file  Intimate Partner Violence:   . Fear of Current or Ex-Partner: Not on file  . Emotionally Abused: Not on file  . Physically Abused: Not on file  . Sexually Abused: Not on file    Family History  Problem Relation Age of Onset  . Hypertension Mother   . Arthritis Mother   . Hyperlipidemia Mother   . Stroke Mother        1st age 74, then recurrent 31  . Atrial fibrillation Mother   . Epilepsy Mother   . Arthritis Father   . Breast cancer Sister   . Cancer Sister        Recurrent Breast CA  . Atrial fibrillation Brother   . Von Willebrand disease Daughter   . Other Daughter        POTS  . Factor V Leiden deficiency Daughter   . Breast cancer  Other   . Drug abuse Maternal Aunt   . Diabetes Neg Hx   . Heart attack Neg Hx   . Colon cancer Neg Hx   . Esophageal cancer Neg Hx   . Rectal cancer Neg Hx   . Stomach cancer Neg Hx     ROS: no fevers or chills, productive cough, hemoptysis, dysphasia, odynophagia, melena, hematochezia, dysuria, hematuria, rash, seizure activity, orthopnea, PND, pedal edema, claudication. Remaining systems are negative.  Physical Exam: Well-developed well-nourished in no acute distress.  Skin is warm and dry.  HEENT is normal.  Neck is supple.  Chest is clear to auscultation with normal expansion.  Cardiovascular exam is regular rate and rhythm.  Abdominal exam nontender or distended. No masses palpated. Extremities show no edema. neuro grossly intact  ECG-normal sinus rhythm at a rate of 66, no ST changes.  Personally reviewed  A/P  1 carotid artery disease-plan to continue aspirin.  Patient is intolerant to statins.  We will plan follow-up carotid Dopplers March 2021.  We will consider CTA if necessary.  2 hypertension-patient's blood pressure is elevated; add amlodipine 2.5 mg daily and follow.  3 hyperlipidemia-patient is intolerant to statins.  Check lipids.  We will consider addition of Zetia or Repatha pending results.  Kirk Ruths, MD

## 2019-10-14 ENCOUNTER — Ambulatory Visit (INDEPENDENT_AMBULATORY_CARE_PROVIDER_SITE_OTHER): Payer: Medicare Other | Admitting: Cardiology

## 2019-10-14 ENCOUNTER — Encounter: Payer: Self-pay | Admitting: Cardiology

## 2019-10-14 ENCOUNTER — Other Ambulatory Visit: Payer: Self-pay

## 2019-10-14 VITALS — BP 146/80 | HR 66 | Ht 65.0 in | Wt 146.1 lb

## 2019-10-14 DIAGNOSIS — I10 Essential (primary) hypertension: Secondary | ICD-10-CM

## 2019-10-14 DIAGNOSIS — I679 Cerebrovascular disease, unspecified: Secondary | ICD-10-CM

## 2019-10-14 DIAGNOSIS — E7800 Pure hypercholesterolemia, unspecified: Secondary | ICD-10-CM

## 2019-10-14 MED ORDER — AMLODIPINE BESYLATE 2.5 MG PO TABS: 2.5000 mg | ORAL_TABLET | Freq: Every day | ORAL | 3 refills | Status: DC

## 2019-10-14 NOTE — Patient Instructions (Signed)
Medication Instructions:  START AMLODIPINE 2.5 MG ONCE DAILY  *If you need a refill on your cardiac medications before your next appointment, please call your pharmacy*  Lab Work: Your physician recommends that you HAVE LAB WORK TODAY  If you have labs (blood work) drawn today and your tests are completely normal, you will receive your results only by: Marland Kitchen MyChart Message (if you have MyChart) OR . A paper copy in the mail If you have any lab test that is abnormal or we need to change your treatment, we will call you to review the results.  Testing/Procedures: Your physician has requested that you have a carotid duplex. This test is an ultrasound of the carotid arteries in your neck. It looks at blood flow through these arteries that supply the brain with blood. Allow one hour for this exam. There are no restrictions or special instructions.DUE IN MARCH    Follow-Up: At [facility], you and your health needs are our priority.  As part of our continuing mission to provide you with exceptional heart care, we have created designated Provider Care Teams.  These Care Teams include your primary Cardiologist (physician) and Advanced Practice Providers (APPs -  Physician Assistants and Nurse Practitioners) who all work together to provide you with the care you need, when you need it.  Your next appointment:   12 month(s)  The format for your next appointment:   Either In Person or Virtual  Provider:   Kirk Ruths, MD

## 2019-10-15 ENCOUNTER — Telehealth: Payer: Self-pay | Admitting: *Deleted

## 2019-10-15 DIAGNOSIS — E7800 Pure hypercholesterolemia, unspecified: Secondary | ICD-10-CM

## 2019-10-15 LAB — LIPID PANEL
Chol/HDL Ratio: 2.8 ratio (ref 0.0–4.4)
Cholesterol, Total: 205 mg/dL — ABNORMAL HIGH (ref 100–199)
HDL: 73 mg/dL (ref >39)
LDL Chol Calc (NIH): 117 mg/dL — ABNORMAL HIGH (ref 0–99)
Triglycerides: 82 mg/dL (ref 0–149)
VLDL Cholesterol Cal: 15 mg/dL (ref 5–40)

## 2019-10-15 MED ORDER — EZETIMIBE 10 MG PO TABS: 10.0000 mg | ORAL_TABLET | Freq: Every day | ORAL | 3 refills | Status: DC

## 2019-10-15 NOTE — Telephone Encounter (Signed)
-----   Message from Lelon Perla, MD sent at 10/15/2019  7:25 AM EST ----- Zetia 10 mg daily Kirk Ruths

## 2019-10-22 DIAGNOSIS — Z1231 Encounter for screening mammogram for malignant neoplasm of breast: Secondary | ICD-10-CM | POA: Diagnosis not present

## 2019-10-22 LAB — HM MAMMOGRAPHY

## 2019-11-02 ENCOUNTER — Encounter: Payer: Self-pay | Admitting: Family Medicine

## 2019-11-04 ENCOUNTER — Other Ambulatory Visit: Payer: Self-pay

## 2019-11-05 ENCOUNTER — Other Ambulatory Visit: Payer: Self-pay

## 2019-11-05 ENCOUNTER — Ambulatory Visit (INDEPENDENT_AMBULATORY_CARE_PROVIDER_SITE_OTHER): Payer: Medicare Other | Admitting: Family Medicine

## 2019-11-05 ENCOUNTER — Encounter: Payer: Self-pay | Admitting: Family Medicine

## 2019-11-05 ENCOUNTER — Other Ambulatory Visit: Payer: Self-pay | Admitting: Family Medicine

## 2019-11-05 VITALS — BP 138/60 | HR 79 | Temp 98.2°F | Resp 18 | Wt 148.0 lb

## 2019-11-05 DIAGNOSIS — I10 Essential (primary) hypertension: Secondary | ICD-10-CM

## 2019-11-05 DIAGNOSIS — R739 Hyperglycemia, unspecified: Secondary | ICD-10-CM

## 2019-11-05 DIAGNOSIS — E559 Vitamin D deficiency, unspecified: Secondary | ICD-10-CM | POA: Diagnosis not present

## 2019-11-05 DIAGNOSIS — E7800 Pure hypercholesterolemia, unspecified: Secondary | ICD-10-CM | POA: Diagnosis not present

## 2019-11-05 DIAGNOSIS — D649 Anemia, unspecified: Secondary | ICD-10-CM | POA: Diagnosis not present

## 2019-11-05 DIAGNOSIS — E039 Hypothyroidism, unspecified: Secondary | ICD-10-CM

## 2019-11-05 DIAGNOSIS — I739 Peripheral vascular disease, unspecified: Secondary | ICD-10-CM

## 2019-11-05 LAB — CBC
HCT: 38.9 % (ref 36.0–46.0)
Hemoglobin: 12.7 g/dL (ref 12.0–15.0)
MCHC: 32.8 g/dL (ref 30.0–36.0)
MCV: 91.5 fl (ref 78.0–100.0)
Platelets: 220 10*3/uL (ref 150.0–400.0)
RBC: 4.25 Mil/uL (ref 3.87–5.11)
RDW: 13.7 % (ref 11.5–15.5)
WBC: 6.9 10*3/uL (ref 4.0–10.5)

## 2019-11-05 LAB — COMPREHENSIVE METABOLIC PANEL
ALT: 20 U/L (ref 0–35)
AST: 24 U/L (ref 0–37)
Albumin: 4.3 g/dL (ref 3.5–5.2)
Alkaline Phosphatase: 95 U/L (ref 39–117)
BUN: 15 mg/dL (ref 6–23)
CO2: 30 mEq/L (ref 19–32)
Calcium: 9.5 mg/dL (ref 8.4–10.5)
Chloride: 103 mEq/L (ref 96–112)
Creatinine, Ser: 0.73 mg/dL (ref 0.40–1.20)
GFR: 79.8 mL/min (ref >60.00)
Glucose, Bld: 85 mg/dL (ref 70–99)
Potassium: 4.1 mEq/L (ref 3.5–5.1)
Sodium: 139 mEq/L (ref 135–145)
Total Bilirubin: 0.6 mg/dL (ref 0.2–1.2)
Total Protein: 7.2 g/dL (ref 6.0–8.3)

## 2019-11-05 LAB — TSH: TSH: 1.48 u[IU]/mL (ref 0.35–4.50)

## 2019-11-05 LAB — FERRITIN: Ferritin: 13.3 ng/mL (ref 10.0–291.0)

## 2019-11-05 LAB — VITAMIN D 25 HYDROXY (VIT D DEFICIENCY, FRACTURES): VITD: 22.02 ng/mL — ABNORMAL LOW (ref 30.00–100.00)

## 2019-11-05 LAB — HEMOGLOBIN A1C: Hgb A1c MFr Bld: 5.9 % (ref 4.6–6.5)

## 2019-11-05 NOTE — Patient Instructions (Signed)
[employer] Blood Pressure cuff, upper arm, want BP 100-140/60-90 Pulse oximeter, want oxygen in 90s  Weekly vitals  Take Multivitamin with minerals, selenium Vitamin D 1000-2000 IU daily Probiotic with lactobacillus and bifidophilus Asprin EC 81 mg daily  Melatonin 2-5 mg at bedtime  [email or web address] 

## 2019-11-06 ENCOUNTER — Other Ambulatory Visit: Payer: Self-pay

## 2019-11-06 MED ORDER — VITAMIN D (ERGOCALCIFEROL) 1.25 MG (50000 UNIT) PO CAPS: 50000.0000 [IU] | ORAL_CAPSULE | ORAL | 4 refills | Status: DC

## 2019-11-09 DIAGNOSIS — I739 Peripheral vascular disease, unspecified: Secondary | ICD-10-CM | POA: Insufficient documentation

## 2019-11-09 NOTE — Assessment & Plan Note (Signed)
Well controlled, no changes to meds. Encouraged heart healthy diet such as the DASH diet and exercise as tolerated.  °

## 2019-11-09 NOTE — Progress Notes (Signed)
Subjective:    Patient ID: Lindsay Hodges, female    DOB: 1956/09/18, 64 y.o.   MRN: [id number]  No chief complaint on file.   HPI Patient is in today for follow up on chronic medical concerns. No recent febrile illness or hospitalizations. She is noting increased pedal edema and leg pain. Left worse than right. Is maintaining quarantine well. Tries to maintain a heart healthy diet and stay active. Denies CP/palp/SOB/HA/congestion/fevers/GI or GU c/o. Taking meds as prescribed  Past Medical History:  Diagnosis Date  . Arthritis   . Bursitis of hip 08/02/15  . Carotid disease, bilateral ([place])    [place] 10/15: R 40-59%, L 50-69%, stable for years  . Elevated sed rate    remote history of myelosuppressive disorder  . Grave's disease    s/p I-131 ablation 1990s  . Hyperlipidemia   . Hypertension   . HYPOTHYROIDISM   . Left knee pain 03/26/2014  . Personal history of colonic adenoma 11/30/2002  . Preventative health care 09/04/2015  . Thrombophlebitis 10/16/2016  . Vitamin D deficiency     Past Surgical History:  Procedure Laterality Date  . COLONOSCOPY    . Jaw surgery  2001  . OOPHORECTOMY Bilateral   . POLYPECTOMY    . VAGINAL HYSTERECTOMY  2009   vaginal hysterectomy    Family History  Problem Relation Age of Onset  . Hypertension Mother   . Arthritis Mother   . Hyperlipidemia Mother   . Stroke Mother        1st age 47, then recurrent 14  . Atrial fibrillation Mother   . Epilepsy Mother   . Arthritis Father   . Breast cancer Sister   . Cancer Sister        Recurrent Breast CA  . Atrial fibrillation Brother   . Von Willebrand disease Daughter   . Other Daughter        POTS  . Factor V Leiden deficiency Daughter   . Breast cancer Other   . Drug abuse Maternal Aunt   . Diabetes Neg Hx   . Heart attack Neg Hx   . Colon cancer Neg Hx   . Esophageal cancer Neg Hx   . Rectal cancer Neg Hx   . Stomach cancer Neg Hx     Social History   Socioeconomic History  .  Marital status: Married    Spouse name: Not on file  . Number of children: Not on file  . Years of education: Not on file  . Highest education level: Not on file  Occupational History    Comment: [employer]  Tobacco Use  . Smoking status: Former Smoker    Packs/day: 0.50    Years: 12.00    Pack years: 6.00    Quit date: 02/06/1993    Years since quitting: 26.7  . Smokeless tobacco: Never Used  Substance and Sexual Activity  . Alcohol use: No  . Drug use: No  . Sexual activity: Yes  Other Topics Concern  . 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 Difficulty of Paying Living Expenses: Not on file  Food Insecurity:   . Worried About Charity fundraiser in the Last Year: Not on file  . Ran Out of Food in the Last Year: Not on file  Transportation Needs:   . Lack of Transportation (Medical): Not on file  .  Lack of Transportation (Non-Medical): Not on file  Physical Activity:   . Days of Exercise per Week: Not on file  . Minutes of Exercise per Session: Not on file  Stress:   . Feeling of Stress : Not on file  Social Connections:   . Frequency of Communication with Friends and Family: Not on file  . Frequency of Social Gatherings with Friends and Family: Not on file  . Attends Religious Services: Not on file  . Active Member of Clubs or Organizations: Not on file  . Attends Archivist Meetings: Not on file  . Marital Status: Not on file  Intimate Partner Violence:   . Fear of Current or Ex-Partner: Not on file  . Emotionally Abused: Not on file  . Physically Abused: Not on file  . Sexually Abused: Not on file    Outpatient Medications Prior to Visit  Medication Sig Dispense Refill  . amLODipine (NORVASC) 2.5 MG tablet Take 1 tablet (2.5 mg total) by mouth daily. 180 tablet 3  . aspirin EC 81 MG tablet Take 81 mg by mouth daily.    Marland Kitchen ezetimibe (ZETIA) 10 MG tablet  Take 1 tablet (10 mg total) by mouth daily. 90 tablet 3  . losartan (COZAAR) 100 MG tablet Take 1 tablet (100 mg total) by mouth daily. 90 tablet 3  . SYNTHROID 75 MCG tablet TAKE 1 TABLET BY MOUTH ONCE DAILY BEFORE  BREAKFAST 90 tablet 0   No facility-administered medications prior to visit.    Allergies  Allergen Reactions  . Penicillins     REACTION: swelling/dyspnea  . Statins Other (See Comments)    Myalgias on several statins    Review of Systems  Constitutional: Negative for fever and malaise/fatigue.  HENT: Negative for congestion.   Eyes: Negative for blurred vision.  Respiratory: Negative for shortness of breath.   Cardiovascular: Negative for chest pain, palpitations and leg swelling.  Gastrointestinal: Negative for abdominal pain, blood in stool and nausea.  Genitourinary: Negative for dysuria and frequency.  Musculoskeletal: Negative for falls.  Skin: Negative for rash.  Neurological: Negative for dizziness, loss of consciousness and headaches.  Endo/Heme/Allergies: Negative for environmental allergies.  Psychiatric/Behavioral: Negative for depression. The patient is not nervous/anxious.        Objective:    Physical Exam  BP 138/60 (BP Location: Left Arm, Patient Position: Sitting, Cuff Size: Normal)   Pulse 79   Temp 98.2 F (36.8 C) (Temporal)   Resp 18   Wt 148 lb (67.1 kg)   SpO2 98%   BMI 24.63 kg/m  Wt Readings from Last 3 Encounters:  11/05/19 148 lb (67.1 kg)  10/14/19 146 lb 1.9 oz (66.3 kg)  05/04/19 143 lb 9.6 oz (65.1 kg)    Diabetic Foot Exam - Simple   No data filed     Lab Results  Component Value Date   WBC 6.9 11/05/2019   HGB 12.7 11/05/2019   HCT 38.9 11/05/2019   PLT 220.0 11/05/2019   GLUCOSE 85 11/05/2019   CHOL 205 (H) 10/14/2019   TRIG 82 10/14/2019   HDL 73 10/14/2019   LDLCALC 117 (H) 10/14/2019   ALT 20 11/05/2019   AST 24 11/05/2019   NA 139 11/05/2019   K 4.1 11/05/2019   CL 103 11/05/2019   CREATININE  0.73 11/05/2019   BUN 15 11/05/2019   CO2 30 11/05/2019   TSH 1.48 11/05/2019   HGBA1C 5.9 11/05/2019    Lab Results  Component Value Date  TSH 1.48 11/05/2019   Lab Results  Component Value Date   WBC 6.9 11/05/2019   HGB 12.7 11/05/2019   HCT 38.9 11/05/2019   MCV 91.5 11/05/2019   PLT 220.0 11/05/2019   Lab Results  Component Value Date   NA 139 11/05/2019   K 4.1 11/05/2019   CO2 30 11/05/2019   GLUCOSE 85 11/05/2019   BUN 15 11/05/2019   CREATININE 0.73 11/05/2019   BILITOT 0.6 11/05/2019   ALKPHOS 95 11/05/2019   AST 24 11/05/2019   ALT 20 11/05/2019   PROT 7.2 11/05/2019   ALBUMIN 4.3 11/05/2019   CALCIUM 9.5 11/05/2019   GFR 79.80 11/05/2019   Lab Results  Component Value Date   CHOL 205 (H) 10/14/2019   Lab Results  Component Value Date   HDL 73 10/14/2019   Lab Results  Component Value Date   LDLCALC 117 (H) 10/14/2019   Lab Results  Component Value Date   TRIG 82 10/14/2019   Lab Results  Component Value Date   CHOLHDL 2.8 10/14/2019   Lab Results  Component Value Date   HGBA1C 5.9 11/05/2019       Assessment & Plan:   Problem List Items Addressed This Visit    Hypothyroidism   Relevant Orders   Hemoglobin A1c (Completed)   Hyperlipidemia    Encouraged heart healthy diet, increase exercise, avoid trans fats, consider a krill oil cap daily. Has been prescribed Zetia by cardiology      Hypertension    Well controlled, no changes to meds. Encouraged heart healthy diet such as the DASH diet and exercise as tolerated.       Relevant Orders   CBC (Completed)   Comprehensive metabolic panel (Completed)   TSH (Completed)   Vitamin D deficiency    Supplement and monitor      Relevant Orders   VITAMIN D 25 Hydroxy (Vit-D Deficiency, Fractures) (Completed)   Hyperglycemia    hgba1c acceptable, minimize simple carbs. Increase exercise as tolerated.       Relevant Orders   Hemoglobin A1c (Completed)   Peripheral vascular  disease ([place]) - Primary    She is noting increased leg pain and edema, has seen Dr Gwenlyn Saran of vascular surgery before is referred back for further evaluation. Left>right      Relevant Orders   Ambulatory referral to Cardiology    Other Visit Diagnoses    Anemia, unspecified type       Relevant Orders   CBC (Completed)   Ferritin (Completed)      I am having Addilynn Nest maintain her losartan, Synthroid, aspirin EC, amLODipine, and ezetimibe.  No orders of the defined types were placed in this encounter.    Penni Homans, MD

## 2019-11-09 NOTE — Assessment & Plan Note (Signed)
Supplement and monitor 

## 2019-11-09 NOTE — Assessment & Plan Note (Addendum)
She is noting increased leg pain and edema, has seen Dr Gwenlyn Saran of vascular surgery before is referred back for further evaluation. Left>right

## 2019-11-09 NOTE — Assessment & Plan Note (Signed)
Encouraged heart healthy diet, increase exercise, avoid trans fats, consider a krill oil cap daily. Has been prescribed Zetia by cardiology

## 2019-11-09 NOTE — Assessment & Plan Note (Signed)
hgba1c acceptable, minimize simple carbs. Increase exercise as tolerated.  

## 2019-11-24 ENCOUNTER — Other Ambulatory Visit: Payer: Self-pay | Admitting: Family Medicine

## 2019-12-15 ENCOUNTER — Ambulatory Visit: Payer: Medicare Other

## 2019-12-15 ENCOUNTER — Encounter (HOSPITAL_COMMUNITY): Payer: Medicare Other

## 2019-12-16 ENCOUNTER — Other Ambulatory Visit: Payer: Self-pay | Admitting: *Deleted

## 2019-12-16 DIAGNOSIS — I6523 Occlusion and stenosis of bilateral carotid arteries: Secondary | ICD-10-CM

## 2019-12-16 DIAGNOSIS — M79606 Pain in leg, unspecified: Secondary | ICD-10-CM

## 2019-12-16 NOTE — Progress Notes (Signed)
HISTORY AND PHYSICAL     CC:  follow up. Requesting Provider:  Mosie Lukes, MD  HPI: This is a 64 y.o. female here for follow up for carotid artery stenosis. She was seen a year ago by Dr. Donzetta Matters as she had neurologic sx of the LUE and underwent workup but did not reveal stroke.  She had a carotid duplex that demonstrated high-grade stenosis of the left and minimal stenosis on the right.  She did not have any hx of stroke or TIA. She was not on antiplatelet therapy and was intolerant to statins in the past.  She has less than 50% stenosis on the right and greater than 70% on the left.  She was to return in one year with follow up duplex.   She was seen by her PCP last month and noted to have increasing leg pain and edema with left > right and an ABI was ordered for today.  Her HTN was well controlled.  She states that Dr. Stanford Breed did had to her medication regimen to better improve her blood pressure.   She presents today for follow up.  She states that she has done well and has not had any amaurosis fugax, speech difficulties, weakness, clumsiness or paralysis of any extremity.  She states she gets some dizziness when she lays down at night.    She denies any pain in her legs with walking.  She states that the left leg gets cramps when laying down.  She does not have any non healing wounds.  She was evaluated 2-3 years ago for venous disease and was seen by Dr. 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  She was prescribed thigh high compression stockings and follow up in 3 months.  In April 2018, she did have injection of spider veins.  She states she has been rowing exercising a couple of times a week.    No family hx of AAA  The pt is not on a statin for cholesterol management.  She is on Zetia The pt is on a daily aspirin.   Other AC:  none The pt is on CCB, ARB for hypertension.   The pt is not diabetic.   Tobacco hx:   Former-quit 1994   Past Medical History:  Diagnosis Date   Arthritis    Bursitis of hip 08/02/15   Carotid disease, bilateral ([employer])    [place] 10/15: R 40-59%, L 50-69%, stable for years   Elevated sed rate    remote history of myelosuppressive disorder   Grave's disease    s/p I-131 ablation 1990s   Hyperlipidemia    Hypertension    HYPOTHYROIDISM    Left knee pain 03/26/2014   Personal history of colonic adenoma 11/30/2002   Preventative health care 09/04/2015   Thrombophlebitis 10/16/2016   Vitamin D deficiency     Past Surgical History:  Procedure Laterality Date   COLONOSCOPY     Jaw surgery  2001   OOPHORECTOMY Bilateral    POLYPECTOMY     VAGINAL HYSTERECTOMY  2009   vaginal hysterectomy    Allergies  Allergen Reactions   Penicillins     REACTION: swelling/dyspnea   Statins Other (See Comments)    Myalgias on several statins    Current Outpatient Medications  Medication Sig Dispense Refill   amLODipine (NORVASC) 2.5 MG tablet Take 1 tablet (2.5 mg total) by mouth daily. 180 tablet 3  aspirin EC 81 MG tablet Take 81 mg by mouth daily.     ezetimibe (ZETIA) 10 MG tablet Take 1 tablet (10 mg total) by mouth daily. 90 tablet 3   losartan (COZAAR) 100 MG tablet Take 1 tablet (100 mg total) by mouth daily. 90 tablet 3   SYNTHROID 75 MCG tablet TAKE 1 TABLET BY MOUTH ONCE DAILY BEFORE  BREAKFAST 90 tablet 0   Vitamin D, Ergocalciferol, (DRISDOL) 1.25 MG (50000 UNIT) CAPS capsule Take 1 capsule (50,000 Units total) by mouth every 7 (seven) days. 4 capsule 4   No current facility-administered medications for this visit.    Family History  Problem Relation Age of Onset   Hypertension Mother    Arthritis Mother    Hyperlipidemia Mother    Stroke Mother        1st age 44, then recurrent 45   Atrial fibrillation Mother    Epilepsy Mother    Arthritis Father    Breast cancer Sister    Cancer Sister        Recurrent Breast CA    Atrial fibrillation Brother    Von Willebrand disease Daughter    Other Daughter        POTS   Factor V Leiden deficiency Daughter    Breast cancer Other    Drug abuse Maternal Aunt    Diabetes Neg Hx    Heart attack Neg Hx    Colon cancer Neg Hx    Esophageal cancer Neg Hx    Rectal cancer Neg Hx    Stomach cancer Neg Hx     Social History   Socioeconomic History   Marital status: Married    Spouse name: Not on file   Number of children: Not on file   Years of education: Not on file   Highest education level: Not on file  Occupational History    Comment: [employer]  Tobacco Use   Smoking status: Former Smoker    Packs/day: 0.50    Years: 12.00    Pack years: 6.00    Quit date: 02/06/1993    Years since quitting: 26.8   Smokeless tobacco: Never Used  Substance and Sexual Activity   Alcohol use: No   Drug use: No   Sexual activity: Yes  Other Topics Concern   Not on file  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   Financial Resource Strain:    Difficulty of Paying Living Expenses:   Food Insecurity:    Worried About Charity fundraiser in the Last Year:    Arboriculturist in the Last Year:   Transportation Needs:    Film/video editor (Medical):    Lack of Transportation (Non-Medical):   Physical Activity:    Days of Exercise per Week:    Minutes of Exercise per Session:   Stress:    Feeling of Stress :   Social Connections:    Frequency of Communication with Friends and Family:    Frequency of Social Gatherings with Friends and Family:    Attends Religious Services:    Active Member of Clubs or Organizations:    Attends Music therapist:    Marital Status:   Intimate Partner Violence:    Fear of Current or Ex-Partner:    Emotionally Abused:    Physically Abused:    Sexually Abused:      REVIEW OF SYSTEMS:   [X]  denotes  positive finding, [ ]   denotes negative finding Cardiac  Comments:  Chest pain or chest pressure:    Shortness of breath upon exertion:    Short of breath when lying flat:    Irregular heart rhythm:        Vascular    Pain in calf, thigh, or hip brought on by ambulation:    Pain in feet at night that wakes you up from your sleep:     Blood clot in your veins:    Leg swelling:         Pulmonary    Oxygen at home:    Productive cough:     Wheezing:         Neurologic    Sudden weakness in arms or legs:     Sudden numbness in arms or legs:     Sudden onset of difficulty speaking or slurred speech:    Temporary loss of vision in one eye:     Problems with dizziness:         Gastrointestinal    Blood in stool:     Vomited blood:         Genitourinary    Burning when urinating:     Blood in urine:        Psychiatric    Major depression:         Hematologic    Bleeding problems:    Problems with blood clotting too easily:        Skin    Rashes or ulcers:        Constitutional    Fever or chills:      PHYSICAL EXAMINATION:  Today's Vitals   12/17/19 0840 12/17/19 0843  BP: (!) 148/70 (!) 141/62  Pulse: 67   Resp: 16   SpO2: 100%   Weight: 147 lb 8 oz (66.9 kg)   Height: 5\' 5"  (1.651 m)    Body mass index is 24.55 kg/m.   General:  WDWN in NAD; vital signs documented above Gait: Not observed HENT: WNL, normocephalic Pulmonary: normal non-labored breathing , without Rales, rhonchi,  wheezing Cardiac: regular HR, without  Murmurs, rubs or gallops; without carotid bruits Abdomen: soft, NT, no masses Skin: without rashes Vascular Exam/Pulses:  Right Left  Radial 2+ (normal) 2+ (normal)  Ulnar 1+ (weak) 1+ (weak)  Popliteal Unable to palpate  Unable to palpate   DP 2+ (normal) 1+ (weak)  PT 2+ (normal) Unable to palpate    Extremities: without ischemic changes, without Gangrene , without cellulitis; without open wounds; no edema; bilateral calves are soft and non tender to  palpation.  Varicosities present left anterior leg and spider veins bilaterally. Musculoskeletal: no muscle wasting or atrophy  Neurologic: A&O X 3 Psychiatric:  The pt has Normal affect.   Non-Invasive Vascular Imaging:   Carotid Duplex on 12/17/2019: Right:  1-39% ICA stenosis Left:  40-59% ICA stenosis Vertebrals: Bilateral vertebral arteries demonstrate antegrade flow.  Subclavians: Normal flow hemodynamics were seen in bilateral subclavian  Arteries.  ABI 12/17/2019: Right:  1.11/0.78 - great toe pressure:  122 Left:  1.07/0.76 - great toe pressure:  119  Previous Carotid duplex on 10/27/2018: Right: <50% ICA stenosis Left:   >70% ICA stenosis Bilateral vertebral arteries are patent with antegrade flow  Previous ABI June 2016: Right:  1.12 Left:  1.05   ASSESSMENT/PLAN:: 64 y.o. female here for follow up carotid artery stenosis.  Carotid disease: -pt doing well and remains asymptomatic.  She does  have some dizziness when laying down at night.  Her vertebral arteries are patent with antegrade flow.  -her right is 1-39% ICA stenosis and the right is 40-59% ICA stenosis.  This is an improved reading from a year ago.  She is not on a statin due to severe myalgias and she has tried several.  She is taking Zeita and is also on an aspirin. -return in one year with repeat carotid duplex  -discussed s/s of stroke with pt and they understand should they develop any of these sx, they will go to the nearest ER.  Leg pain: -pt has some cramping in her legs when she lays down at night.  Her ABI's remain normal.  She does have hx of reflux disease in the past.  I recommended to her to wear her thigh high 20-57mmHg compression socks and leg elevation to see if she gets some relief since she does have known reflux.  If this gets more bothersome, she will return to have this re-evaluated.     Leontine Locket, [facility] [phone]  Clinic MD:  Oneida Alar

## 2019-12-17 ENCOUNTER — Other Ambulatory Visit: Payer: Self-pay

## 2019-12-17 ENCOUNTER — Ambulatory Visit (INDEPENDENT_AMBULATORY_CARE_PROVIDER_SITE_OTHER)
Admission: RE | Admit: 2019-12-17 | Discharge: 2019-12-17 | Disposition: A | Discharge status: Home / Self Care | Payer: Medicare Other | Source: Ambulatory Visit | Attending: Vascular Surgery | Admitting: Vascular Surgery

## 2019-12-17 ENCOUNTER — Ambulatory Visit (INDEPENDENT_AMBULATORY_CARE_PROVIDER_SITE_OTHER): Payer: Medicare Other | Admitting: Physician Assistant

## 2019-12-17 ENCOUNTER — Ambulatory Visit (HOSPITAL_COMMUNITY)
Admission: RE | Admit: 2019-12-17 | Discharge: 2019-12-17 | Disposition: A | Discharge status: Home / Self Care | Payer: Medicare Other | Source: Ambulatory Visit | Attending: Vascular Surgery | Admitting: Vascular Surgery

## 2019-12-17 VITALS — BP 141/62 | HR 67 | Resp 16 | Ht 65.0 in | Wt 147.5 lb

## 2019-12-17 DIAGNOSIS — I6523 Occlusion and stenosis of bilateral carotid arteries: Secondary | ICD-10-CM

## 2019-12-17 DIAGNOSIS — M79606 Pain in leg, unspecified: Secondary | ICD-10-CM

## 2019-12-18 ENCOUNTER — Other Ambulatory Visit: Payer: Self-pay | Admitting: *Deleted

## 2019-12-18 DIAGNOSIS — I6523 Occlusion and stenosis of bilateral carotid arteries: Secondary | ICD-10-CM

## 2019-12-23 ENCOUNTER — Other Ambulatory Visit: Payer: Self-pay | Admitting: Family Medicine

## 2019-12-31 DIAGNOSIS — E7800 Pure hypercholesterolemia, unspecified: Secondary | ICD-10-CM | POA: Diagnosis not present

## 2020-01-01 ENCOUNTER — Telehealth: Payer: Self-pay | Admitting: *Deleted

## 2020-01-01 LAB — LIPID PANEL
Chol/HDL Ratio: 2.5 ratio (ref 0.0–4.4)
Cholesterol, Total: 179 mg/dL (ref 100–199)
HDL: 71 mg/dL (ref >39)
LDL Chol Calc (NIH): 96 mg/dL (ref 0–99)
Triglycerides: 60 mg/dL (ref 0–149)
VLDL Cholesterol Cal: 12 mg/dL (ref 5–40)

## 2020-01-01 LAB — HEPATIC FUNCTION PANEL
ALT: 15 IU/L (ref 0–32)
AST: 21 IU/L (ref 0–40)
Albumin: 4.2 g/dL (ref 3.8–4.8)
Alkaline Phosphatase: 102 IU/L (ref 39–117)
Bilirubin Total: 0.3 mg/dL (ref 0.0–1.2)
Bilirubin, Direct: 0.12 mg/dL (ref 0.00–0.40)
Total Protein: 6.5 g/dL (ref 6.0–8.5)

## 2020-01-01 NOTE — Telephone Encounter (Signed)
Patient returning Debra's call.  

## 2020-01-01 NOTE — Telephone Encounter (Addendum)
Left message for pt to call   ----- Message from Lelon Perla, MD sent at 01/01/2020  7:35 AM EDT ----- Refer to lipid clinic for repatha Kirk [facility]s

## 2020-01-01 NOTE — Telephone Encounter (Signed)
Spoke with pt, Follow up scheduled  

## 2020-01-26 ENCOUNTER — Ambulatory Visit (INDEPENDENT_AMBULATORY_CARE_PROVIDER_SITE_OTHER): Payer: Medicare Other | Admitting: Pharmacist Clinician (PhC)/ Clinical Pharmacy Specialist

## 2020-01-26 ENCOUNTER — Other Ambulatory Visit: Payer: Self-pay

## 2020-01-26 DIAGNOSIS — E7800 Pure hypercholesterolemia, unspecified: Secondary | ICD-10-CM

## 2020-01-26 DIAGNOSIS — I6523 Occlusion and stenosis of bilateral carotid arteries: Secondary | ICD-10-CM

## 2020-01-26 NOTE — Assessment & Plan Note (Signed)
Patient with hyperlipidemia, currently not at goal on ezetimibe 10 mg daily.  Reviewed information regarding PCSK-9 inhibitors and bempedoic acid.  Reviewed mechanisms of action, side effects, expected benefit and cost.  Patient agreeable to try PCSK-9 inhibitor.  Per her insurance, Praluent is the preferred medication.  Cost may be an issue, as she has a 48% copay and brand deductible of $448/year.  She was given information on the [employer] to help cover costs.  We will repeat labs after 3 months.

## 2020-01-26 NOTE — Patient Instructions (Addendum)
Your Results:             Your most recent labs Goal  Total Cholesterol 179 < 200  Triglycerides 60 < 150  HDL (happy/good cholesterol) 71 > 40  LDL (lousy/bad cholesterol 96 < 70     Medication changes:  We will start the paperwork to get Praluent covered.  Once approved, Grandville Silos will call to let you know it is at your pharmacy for pick up  Lab orders:  Repeat cholesterol labs after 5-6 doses (about 3 months).  We will mail lab orders to you about a month before.  Patient Assistance:  The [employer] foundation offers assistance to help pay for medication copays.  They will cover copays for all cholesterol lowering meds, including statins, fibrates, omega-3 oils, ezetimibe, Repatha, Praluent, Nexletol, Nexlizet.  The cards are usually good for $2,500 or 12 months, whichever comes first. 1. Go to healthwellfoundation.org 2. Click on "Apply Now" 3. Answer questions as to whom is applying (patient or representative) 4. Your disease fund will be "hypercholesterolemia - Medicare access" 5. They will ask questions about finances and which medications you are taking for cholesterol 6. When you submit, the approval is usually within minutes.  You will need to print the card information from the site 7. You will need to show this information to your pharmacy, they will bill your Medicare Part D plan first -then bill [employer] Well --for the copay.   You can also call them at [phone], although the hold times can be quite long.   Thank you for choosing [facility]

## 2020-01-26 NOTE — Progress Notes (Signed)
01/26/2020 Lindsay Hodges [birth year] [id number]   HPI:  Lindsay Hodges is a 64 y.o. female patient of Dr Stanford Breed, who presents today for a lipid clinic evaluation.  See pertinent past medical history below.  She was seen by Dr. Stanford Breed in January, however labs drawn in February show LDL not to goal of < 70.  Patient has been intolerant to multiple statin drugs (see below).    Today she is in the office to discuss further options for lowering her cholesterol.    Past Medical History: Carotid stenosis < 40% right, 40-60% left stenosis  hypertension Last 146/80, started amlodipine 2.5 qd  PVD Referred back to Dr. Gwenlyn Saran at vascular surgery   hypothyroidism 2/21:  TSH 1.48 on synthroid 75 mcg  Vitamin D deficiency 2/21:  22.02 on vitamin D 1.25 mg/weekly   Current Medications: ezetimibe 10 mg daily  Cholesterol Goals: LDL < 70   Intolerant/previously tried: atorvastatin, pravastatin, rosuvastatin - myalgias after 2-3 weeks    Cost issues with ezetimibe $200 with insurance for 90 days  Family history: don't know about father family; mother died at 31 after stroke; 1 sister deceased from breast cancer complications; 3 daughters, one with high cholesterol  Diet: apples and popcorn for snacks; mostly home cooked foods, nothing deep fried, good mix of vegetables  Exercise:  Indoor rowing 2 days per week, 1 day of kettle balls, walk 3 miles most other days  Labs: 4/21:  TC 179, TG 60, HDL 71, LDL 96 (on ezetimibe 10 mg daily- )   Current Outpatient Medications  Medication Sig Dispense Refill  . ALTRENO 0.05 % LOTN APPLY TO THE FACE AT BEDTIME    . amLODipine (NORVASC) 2.5 MG tablet Take 1 tablet (2.5 mg total) by mouth daily. 180 tablet 3  . aspirin EC 81 MG tablet Take 81 mg by mouth daily.    Marland Kitchen ezetimibe (ZETIA) 10 MG tablet Take 1 tablet (10 mg total) by mouth daily. 90 tablet 3  . losartan (COZAAR) 100 MG tablet Take 1 tablet (100 mg total) by mouth daily. 90 tablet 3  . SYNTHROID 75  MCG tablet TAKE 1 TABLET BY MOUTH ONCE DAILY BEFORE BREAKFAST 90 tablet 0  . Vitamin D, Ergocalciferol, (DRISDOL) 1.25 MG (50000 UNIT) CAPS capsule Take 1 capsule (50,000 Units total) by mouth every 7 (seven) days. (Patient not taking: Reported on 12/17/2019) 4 capsule 4   No current facility-administered medications for this visit.    Allergies  Allergen Reactions  . Penicillins     REACTION: swelling/dyspnea  . Statins Other (See Comments)    Myalgias on several statins    Past Medical History:  Diagnosis Date  . Arthritis   . Bursitis of hip 08/02/15  . Carotid disease, bilateral ([place])    [place] 10/15: R 40-59%, L 50-69%, stable for years  . Elevated sed rate    remote history of myelosuppressive disorder  . Grave's disease    s/p I-131 ablation 1990s  . Hyperlipidemia   . Hypertension   . HYPOTHYROIDISM   . Left knee pain 03/26/2014  . Personal history of colonic adenoma 11/30/2002  . Preventative health care 09/04/2015  . Thrombophlebitis 10/16/2016  . Vitamin D deficiency     Temperature 98.4 F (36.9 C), height 5\' 5"  (1.651 m), weight 147 lb 6.4 oz (66.9 kg).   Hyperlipidemia Patient with hyperlipidemia, currently not at goal on ezetimibe 10 mg daily.  Reviewed information regarding PCSK-9 inhibitors and bempedoic acid.  Reviewed mechanisms of action,  side effects, expected benefit and cost.  Patient agreeable to try PCSK-9 inhibitor.  Per her insurance, Praluent is the preferred medication.  Cost may be an issue, as she has a 48% copay and brand deductible of $448/year.  She was given information on the [employer] to help cover costs.  We will repeat labs after 3 months.      Tommy Medal PharmD CPP [facility] [address] [place], [place] [address] [phone]

## 2020-01-28 ENCOUNTER — Telehealth: Payer: Self-pay

## 2020-01-28 MED ORDER — PRALUENT 150 MG/ML ~~LOC~~ SOAJ: 150.0000 mg | SUBCUTANEOUS | 11 refills | Status: DC

## 2020-01-28 NOTE — Telephone Encounter (Signed)
Called and spoke w/pt instructing her to star praluent. Pt expressed cost concerns. I stated we won't know cost until we send it to the pharmacy. Pt voiced understanding and agreed to let me send rx to determine cost, pt will call back if unaffordable.

## 2020-02-01 ENCOUNTER — Telehealth: Payer: Self-pay | Admitting: Pharmacist

## 2020-02-01 NOTE — Telephone Encounter (Signed)
Patient calling to discuss patient assistance options for Praluent.  Co-pay is unaffordable.

## 2020-02-01 NOTE — Telephone Encounter (Signed)
Pt called and stated that they will not take the the pcsk9 therapy [employer] denied them due to income over $100,000. Pt would like to discuss other options.... will route to pharmd

## 2020-02-02 ENCOUNTER — Telehealth: Payer: Self-pay | Admitting: Pharmacist Clinician (PhC)/ Clinical Pharmacy Specialist

## 2020-02-02 NOTE — Telephone Encounter (Signed)
Praluent cost prohibitive

## 2020-02-02 NOTE — Telephone Encounter (Signed)
copay is almost $700 for first month supply.  Unknown if will be a similar cost after.  Unfortunately would most likely be a similar cost to try Nexletol.  Patient agreeable to see if she would qualify for [facility] research study

## 2020-02-03 ENCOUNTER — Telehealth: Payer: Self-pay | Admitting: *Deleted

## 2020-02-03 DIAGNOSIS — Z006 Encounter for examination for normal comparison and control in clinical research program: Secondary | ICD-10-CM

## 2020-02-03 NOTE — Telephone Encounter (Signed)
Spoke with patient about the [place].Consent e-mail per pt request.Patient to call [phone] if decides to participate in study.

## 2020-02-16 ENCOUNTER — Other Ambulatory Visit: Payer: Self-pay

## 2020-02-16 ENCOUNTER — Encounter: Payer: Medicare Other | Admitting: *Deleted

## 2020-02-16 DIAGNOSIS — Z006 Encounter for examination for normal comparison and control in clinical research program: Secondary | ICD-10-CM

## 2020-02-16 NOTE — Research (Signed)
Eligibility Criteria Worksheet    AMG [id number]              Site No. [id number]                      Subject ID [id number]   Code Inclusion Criteria NOTE:The subject is not eligible for the study if any criterion is checked NO  No Yes  These inclusion criteria are to be assessed during the screening period prior to enrollment into the baseline period:  101 Subject has provided informed consent prior to initiation of any study specific activities/procedures '[]'  '[x]'   102 Adult subjects ? 98 years (men) or ? 5 years (women) to < 24 years of age (either sex) and meeting lipid criteria '[]'  '[x]'   107 Subjects must have an LDL-C ? 90 mg/dL (? 2.3 mmol/L) OR non-HDL-C ? 120 mg/dL  (? 3.1 mmol/L), OR apolipoprotein B ? 80 mg/dL (? 1.56 ?mol/L)        ?  Lipid entry criteria can be measured up to 3 months prior to           screening in  the absence of changes to background therapy         ?  Lipid criteria should be assessed after ? 2 weeks of stable,              optimized lipid-lowering therapy '[]'   '[]'   '[]'  '[x]'   '[]'   '[]'   108 Diagnostic evidence of at least 1 of the following (A - D) at screening:  '[]'  Significant coronary artery disease meeting at least 1 of the following criteria:          ?  History of coronary revascularization with multi-vessel coronary                Disease as evidenced by any of the following:                           '[]'  (a)percutaneous coronary intervention (PCI) of 2 or more                     vessels,  including branch arteries                   '[]'  (b)  PCI or coronary artery bypass grafting (CABG) with                          residual ?  50% stenosis in a separate, unrevascularized                           vessel, or               '[]'  (c) multi-vessel CABG 5 years or more prior to screening               '[]'  Significant coronary disease without prior revascularization                     As  evidenced  by either a ? 70% stenosis of at least 1                      coronary artery, ? 50% stenosis of 2 or more coronary  arteries, or ? 50% stenosis of the left main coronary artery              '[]'  known coronary artery calcium score ? 100 in subjects                     without a coronary  artery revascularization prior to                      randomization '[]'  B. Significant atherosclerotic cerebrovascular disease meeting at               least 1 of  the following criteria:         '[]'   prior transient ischemic attack with ? 50% carotid stenosis      '[x]'  internal or external carotid artery stenosis of ? 70% or 2 or more ?             50% stenoses      '[]'  prior internal or external carotid artery revascularization      '[]'  C. Significant peripheral arterial disease meeting at least 1 of the            following criteria:       '[]'  ? 50% stenosis in a limb artery       '[]'  history of abdominal aorta treatment (percutaneous and surgical)             due to atherosclerosis disease      '[]'  ankle brachial index (ABI) < 0.85 '[]'  D. Diabetes mellitus with at least 1 of the following:       '[]'  known microvascular disease, defined by diabetic nephropathy or           Treated retinopathy. Diabetic nephropathy defined as persistent             Microalbuminuria (urinary albumin to creatinine ratio ? 30 mg/g)              and/or persistent Estimated glomerular filtration rate  (eGFR)             < 60 mL/min/1.73 m2 that is not  reversible due to an acute illness      '[]'  chronic daily treatment with an intermediate or long-acting insulin      '[]'  diabetes diagnosis ? 10 years ago  '[]'   '[x]'   109 At least 1 of the following high risk criteria (most recent lab values within   6 months prior to screening, as applicable): '[]'  polyvascular disease, defined as coronary, carotid, or peripheral       artery stenosis ? 50% in a second distinct vascular location in a       patient with coronary, cerebral or peripheral arterial disease        (inclusion criterion  108 A-C)  '[]'  presence of either diabetes mellitus or metabolic syndrome (see Section 12.9 of the protocol) in a subject with coronary, cerebral, or peripheral artery disease (inclusion criterion 108 A-C) '[]'  at least 1 coronary, carotid, or peripheral artery residual stenosis of ? 50% in a patient with diabetes meeting inclusion criterion 108 D '[]'  LDL-C ? 130 mg/dL (? 3.36 mmol/L), OR non-HDL-C ? 160 mg/dL       (? 4.14 mmol/L), OR apolipoprotein B ? 120 mg/dL (2.3 ?mol/L) if        available '[]'  lipoprotein (a) > 125 nmol/L (50 mg/dL '[]'  known familial hypercholesterolemia  '[]'  family history of premature coronary artery disease  defined as an MI or CABG  in the subject's father or brother at age < 79 years or an MI or CABG in the  subject's mother or sister at age < 58 years '[]'  hsCRP ? 3.0 mg/L in the absence of an acute illness '[]'  current tobacco use  '[x]'  ? 64 years of age  '[]'  menopause before 64 years of age  '[]'  eGFR 15 to < 45 mL/min/1.73 m2  '[]'  coronary artery calcification score ? 300 in a patient without a        coronary revascularization  prior to randomization      '[]'  '[]'   106 FOR [place] ONLY: Subject affiliated to a social security scheme '[]'  '[]'   Exclusion Criteria NOTE: The subject is not eligible for the study if any criterion is checked YES.  Disease Related   201 MI or stroke prior to randomization.  '[x]'  '[]'   202 CABG < 3 months prior to screening  '[x]'  '[]'   203 Uncontrolled or recurrent ventricular tachycardia in the absence of an implantable-cardioverter defibrillator. '[x]'  '[]'   204 Atrial fibrillation not on anticoagulation therapy (vitamin K antagonist, heparin, low-molecular weight heparin, fondaparinux, or non-vitamin K antagonist oral anticoagulant) '[x]'  '[]'   206 Last measured left-ventricular ejection fraction < 30% or New York Heart Association (NYHA) Functional Class III/IV. '[x]'  '[]'   219 Planned arterial revascularization '[x]'  '[]'   Diagnostic Assessments  207 Fasting triglycerides  ? 500 mg/dL (5.7 mmol/L) at screening '[x]'  '[]'   208 End stage renal disease (ESRD), defined as an eGFR < 15 mL/min/1.73 m2 or receiving dialysis  at screening '[x]'  '[]'   Other Medical Conditions  209 Malignancy (except non-melanoma skin cancers, cervical in situ carcinoma, breast ductal carcinoma in situ, or stage 1 prostate carcinoma) within the last 5 years prior to day 1 '[x]'  '[]'   210 History or evidence of clinically significant disease (eg, malignancy, respiratory, gastrointestinal, renal or psychiatric disease) or unstable disorder that, in the opinion of the investigator(s), [employer] physician or designee would pose a risk to the patient's safety or interfere with the study assessments, procedures, completion, or result in a life expectancy of less than 1 year '[x]'  '[]'      220 Persistent acute liver disease or hepatic dysfunction, defined as Child Pugh score of C  (see Appendix 12.11 of the protocol) '[x]'  '[]'   Prior/Concomitant Therapy  212 Previously received a cholesterol ester transfer protein (CETP) inhibitor (ie, anacetrapib, dalcetrapib, evacetrapib), mipomersen, lomitapide, or has undergone LDL-apheresis in the last  12 months prior to LDL-C screening '[x]'  '[]'   221 Previously received or receiving any other therapy to inhibit PCSK9 in the following timeframe  prior to screening:  ?  bococizumab at any time  ? evolocumab, alirocumab, or any other monoclonal antibody against PCSK9 within 3 months  ? inclisiran within 12 months '[x]'  '[]'   Prior/Concurrent Clinical Study Experience  213 Currently receiving treatment in another investigational device or drug study, or less than 30 days since ending treatment on another investigational device or drug study(ies). '[x]'  '[]'   Other Exclusions  59 Female subjects of childbearing potential unwilling to use 1 acceptable method of effective contraception during treatment and for an additional 15 weeks after the last dose of investigational product. Refer to Section  12.5 of the protocol for additional contraceptive information. '[x]'  '[]'   216 Subject has known sensitivity to any of the products or components to be administered during dosing '[x]'  '[]'   217 Subject likely to not be available to complete all protocol-required study visits or  procedures, and/or to comply with all required study procedures to the best of the subject and investigator's knowledge '[x]'  '[]'   218 Subject is staff personnel directly involved with the study or is a family member of the investigational study staff '[x]'  '[]'   46 Female subject is pregnant, had a positive pregnancy test at screening (by a serum pregnancy test and/or urine pregnancy test), breastfeeding, or planning to become pregnant or breastfeed during treatment and for an additional 15 weeks after the last dose of investigational product. '[x]'  '[]'     999 Subject met eligibility criteria but did not enroll '[]'  '[x]'   Protocol Amendment 2.0, Protocol Date: 28-Nov-2018; [place] Supplement Version 1.0, Protocol Date: 16-Apr-2018 Worksheet Date: 01-Jan-2019 Document Title: [id number] Eligibility Criteria Worksheet Template v 2.0 Approved 18-Feb-201 b-201    [facility] Screening Visit  Patient Name Lindsay Hodges DOB 07-20-1956  Subject ID# [id number]  Visit Date/Informed Consent Date 02/16/20  Demographics      Sex female   Ethnicity Not Hispanic or Latino  Age 64 y.o.  Race '[x]' White '[]' Black or African American '[]' Asian '[]' American [place] and [place] Native '[]' Native Hawaiian '[]' Other Pacific Islander  Tobacco use  '[]' never '[]' current '[x]' former Type  '[x]' Cigarettes '[]' Cigars '[]' E-cigarettes '[]' Smokeless  Quantity and unit ____ Frequency  '[]' Daily '[]' Every week '[]' Every month '[]' Occasional '[]' Other Duration ____  '[]' Days '[]' Weeks '[]' Months '[]' Years  Reproductive Status  '[]' Childbearing potentially (Primary method of birth control ____)      (Date of last menstrual period____) (Breastfeeding?____) '[x]' postmenopausal '[]' Surgically  sterile '[]' Infertile '[]' other  Current Statin ___  Statin intolerance If the subject is not on a high-intensity statin, please indicate the primary reason. '[]' Not indicated per local professional society guidelines '[x]' Demonstrated intolerance '[]' Subject refusal '[]' other  If demonstrated intolerance, provide statin(s) used ____  Select symptom related to statin use '[x]' Myalgia '[]' Increase Creatine Kinase '[]' Increase in liver function enzymes '[]' Other   Labs  Collection Date  12/31/2019   Time: 0844 Patient Fasting   '[x]' Yes   '[]' No '[]' Urine pregnancy test '[x]' Chemistry '[x]' Fasting lipid panel  Placebo injection Box # [id number]  Lot# [id number] Administration time 1250 '[]' AM '[x]' PM in clinic Quantity Administered    '[]' None '[]' Partial '[x]' Full Reason for None or Partial Dose ____ Administration Site '[x]' Abdomen '[]' Arm '[]' Thigh Laterality '[x]' Left  '[]' Right Administered by '[x]' Patient '[]' Caregiver Subject and Caregiver Training Checklist   Subject name: Lindsay Hodges  Subject #: [id number]      Date: 02/16/20  Will caregiver be giving injections?  '[]'  Yes   '[x]'  No    (If yes, complete checklist for caregiver.)   Training Activities  Check applicable box once activity is completed  Prepare for Injection  Review Subject Starter Kit (Transport tote, subject welcome letter, IFU, Quick Reference Guide, subject brochure, Autoinjector(AI) injection video module/DVD, subject ID/study contact card)  '[x]'   Gather two (2) training units, reset tool '[x]'   Gather five (5) alcohol swabs '[x]'   Ship broker pre-filled autoinjector '[x]'   Gather sharps container '[x]'   Injection Instruction  Explain purpose of training and provide overview '[x]'   Review entire IFU and perform test injection with training unit '[x]'   Subject successfully self-injects with "live" autoinjector '[x]'   After the Injection  Ensure subject is comfortable with home self-injections '[x]'   Instruct what to do for unit issues (i.e.,  malfunction, lost, destroyed), proper storage and return of all used autoinjectors '[x]'   Confirm subject has all questions answered '[x]'    Educator printed name: Preston Fleeting        Note: This certificate must be filed in the subject chart.  If a caregiver is being certified to give the home injection, the certificate must also be filed in the subject chart.

## 2020-02-16 NOTE — Research (Signed)
Subject Name: Lindsay Hodges  Subject met inclusion and exclusion criteria.  The informed consent form, study requirements and expectations were reviewed with the subject and questions and concerns were addressed prior to the signing of the consent form.  The subject verbalized understanding of the trial requirements.  The subject agreed to participate in the [facility]  trial and signed the informed consent at 1155 on 02/16/20  The informed consent was obtained prior to performance of any protocol-specific procedures for the subject.  A copy of the signed informed consent was given to the subject and a copy was placed in the subject's medical record.   [name]

## 2020-02-22 ENCOUNTER — Encounter: Payer: Medicare Other | Admitting: *Deleted

## 2020-02-22 ENCOUNTER — Telehealth: Payer: Self-pay | Admitting: Family Medicine

## 2020-02-22 ENCOUNTER — Other Ambulatory Visit: Payer: Self-pay

## 2020-02-22 DIAGNOSIS — Z006 Encounter for examination for normal comparison and control in clinical research program: Secondary | ICD-10-CM

## 2020-02-22 NOTE — Research (Signed)
[facility] Day 1 Visit-Randomization  Patient Name Abrienne Turrill Subject ID# [id number] Randomization#   Visit Date 02/22/20   IP Dispensation: Date dispensed 02/22/20 Number dispensed- total: 8 Number dispensed home to patient: 7 Box # [id number] Box # [id number]  IP Administration in Clinic: Box # [id number] Administration time 0840 [] None [] Partial [x] Full Reason for None or Partial Dose ____ Administration Site [x] Abdomen [] Arm [] Thigh Laterality [x] Left  [] Right Administered by [x] Patient [] Caregiver PT LEFT CLINIC AT [phone]  Patient provided with: Subject welcome letter, subject wallet card, subject informational brochure, instructions for use, reference guide, white IP storage box and tote bag.

## 2020-02-22 NOTE — Telephone Encounter (Signed)
moderna vaccine  Patient is vaccinated   First dose 11/05/2019 Second dose 12/01/2019

## 2020-02-22 NOTE — Telephone Encounter (Signed)
Documented vaccines.

## 2020-02-26 NOTE — Research (Signed)
Pt referred for consideration of lipid investigational study by lipid clinic.  Praluent recommended, but unaffordable, and decision made by clinic and patient to pursue this route.  Purpose of study, and details reviewed with patient who wishes to proceed.   Patient has carotid disease, and is treated with ASA.  Also has hypertension, and hyperlipidemia, and is intolerant to statins.  Praluent/ezetimibe both deemed unaffordable by patient and team at lipid clinic visit.  Carotid doppler studies noted.  Exam  Cannot appreciate carotid bruits on either side.  Lungs clear.  Cardiac rhythm regular Abdomen negative.  Neuro is non focal.   Patient enrolled in [place] study for roll in.   Addison Lank, MD, [place] Director, [facility]

## 2020-03-04 ENCOUNTER — Other Ambulatory Visit: Payer: Self-pay

## 2020-03-04 ENCOUNTER — Emergency Department (INDEPENDENT_AMBULATORY_CARE_PROVIDER_SITE_OTHER)
Admission: EM | Admit: 2020-03-04 | Discharge: 2020-03-04 | Disposition: A | Discharge status: Home / Self Care | Payer: Medicare Other | Source: Home / Self Care

## 2020-03-04 DIAGNOSIS — R109 Unspecified abdominal pain: Secondary | ICD-10-CM

## 2020-03-04 DIAGNOSIS — N3000 Acute cystitis without hematuria: Secondary | ICD-10-CM

## 2020-03-04 DIAGNOSIS — M549 Dorsalgia, unspecified: Secondary | ICD-10-CM

## 2020-03-04 DIAGNOSIS — R1031 Right lower quadrant pain: Secondary | ICD-10-CM

## 2020-03-04 DIAGNOSIS — R110 Nausea: Secondary | ICD-10-CM | POA: Diagnosis not present

## 2020-03-04 LAB — POCT CBC W AUTO DIFF (K'VILLE URGENT CARE)

## 2020-03-04 LAB — POCT URINALYSIS DIP (MANUAL ENTRY)
Bilirubin, UA: NEGATIVE
Blood, UA: NEGATIVE
Glucose, UA: NEGATIVE mg/dL
Ketones, POC UA: NEGATIVE mg/dL
Nitrite, UA: POSITIVE — AB
Protein Ur, POC: NEGATIVE mg/dL
Spec Grav, UA: 1.025 (ref 1.010–1.025)
Urobilinogen, UA: 0.2 E.U./dL
pH, UA: 5 (ref 5.0–8.0)

## 2020-03-04 MED ORDER — NITROFURANTOIN MONOHYD MACRO 100 MG PO CAPS: 100.0000 mg | ORAL_CAPSULE | Freq: Two times a day (BID) | ORAL | 0 refills | Status: AC

## 2020-03-04 NOTE — ED Triage Notes (Signed)
Patient presents to Urgent Care with complaints of intermittent right shoulder pain and right groin pain in the evenings since about 2 weeks ago. Patient reports she thought at first it was from muscle overuse but now she is concerned it may be her gallbladder.

## 2020-03-04 NOTE — Discharge Instructions (Signed)
  Please take your antibiotic as prescribed. A urine culture has been sent to check the severity of your urinary infection and to determine if you are on the most appropriate antibiotic. The results should come back within 2-3 days. You will only be notified if a medication change is indicated.  Please follow up with family medicine or urology if not improving within 1 week, sooner if symptoms worsening.   You will be notified of the rest of your blood work tomorrow only if it is abnormal and further testing is recommended.  You can view all results on your free [facility] account.  Follow up with family medicine next week if not improving. Call 911 or go to the hospital if symptoms worsening- worsening pain, unable to keep down fluids, dizziness, passing out, or other new concerning symptoms develop.

## 2020-03-04 NOTE — ED Provider Notes (Signed)
[name]    CSN: [id number] Arrival date & time: 03/04/20  1317      History   Chief Complaint Chief Complaint  Patient presents with  . Shoulder Pain  . Groin Pain    Right side    HPI Lindsay Hodges is a 64 y.o. female.   HPI  Lindsay Hodges is a 64 y.o. female presenting to UC with c/o intermittent Right upper back pain around her shoulder blade and Right groin pain for a few weeks.  Pain is worse in the evening over the last 2 weeks.  Back pain is worse after eating.  She initially thought pain was from muscle overuse with her rowing machine, which she has been using for about 1 year now, but is concerned pain may be from her gallbladder since it worsens after eating. She recalls having a scan of her gallbladder several years ago and believes they may have found a "polyp" on her liver but otherwise everything was normal. Denies fever, chills, n/v/d. No urinary symptoms. No change in bowel movements.  No hx of abdominal surgeries.    Past Medical History:  Diagnosis Date  . Arthritis   . Bursitis of hip 08/02/15  . Carotid disease, bilateral ([place])    [place] 10/15: R 40-59%, L 50-69%, stable for years  . Elevated sed rate    remote history of myelosuppressive disorder  . Grave's disease    s/p I-131 ablation 1990s  . Hyperlipidemia   . Hypertension   . HYPOTHYROIDISM   . Left knee pain 03/26/2014  . Personal history of colonic adenoma 11/30/2002  . Preventative health care 09/04/2015  . Thrombophlebitis 10/16/2016  . Vitamin D deficiency     Patient Active Problem List   Diagnosis Date Noted  . Peripheral vascular disease ([place]) 11/09/2019  . Elevated liver enzymes 05/04/2019  . Insomnia 03/15/2019  . Flank pain 11/03/2018  . Fall 02/25/2018  . Elevated sed rate   . Cervical cancer screening 03/01/2017  . Hyperglycemia 10/16/2016  . Plantar fasciitis, left 01/10/2016  . Preventative health care 09/04/2015  . Sun-damaged skin 09/04/2015  . Bursitis of hip  08/02/2015  . Low back pain radiating to both legs 03/30/2015  . Trochanteric bursitis of right hip 02/14/2015  . Left knee pain 03/26/2014  . Seasonal affective disorder ([place]) 11/25/2012  . Vitamin D deficiency 02/14/2012  . Hyperlipidemia   . Hypertension   . Graves' disease   . Arthritis   . Carotid disease, bilateral ([place])   . CAROTID BRUIT, LEFT 04/11/2009  . Hypothyroidism 01/06/2009  . Personal history of colonic adenoma 11/30/2002    Past Surgical History:  Procedure Laterality Date  . COLONOSCOPY    . Jaw surgery  2001  . OOPHORECTOMY Bilateral   . POLYPECTOMY    . VAGINAL HYSTERECTOMY  2009   vaginal hysterectomy    OB History    Gravida  3   Para  3   Term      Preterm      AB      Living  3     SAB      TAB      Ectopic      Multiple  1   Live Births               Home Medications    Prior to Admission medications   Medication Sig Start Date End Date Taking? Authorizing Provider  ALTRENO 0.05 % LOTN APPLY TO THE  FACE AT BEDTIME 11/16/19   [provider]  amLODipine (NORVASC) 2.5 MG tablet Take 1 tablet (2.5 mg total) by mouth daily. 10/14/19 01/12/20  Lelon Perla, MD  aspirin EC 81 MG tablet Take 81 mg by mouth daily.    [provider]  ezetimibe (ZETIA) 10 MG tablet Take 1 tablet (10 mg total) by mouth daily. 10/15/19 01/13/20  Lelon Perla, MD  losartan (COZAAR) 100 MG tablet Take 1 tablet (100 mg total) by mouth daily. 08/24/19   Lelon Perla, MD  nitrofurantoin, macrocrystal-monohydrate, (MACROBID) 100 MG capsule Take 1 capsule (100 mg total) by mouth 2 (two) times daily for 7 days. 03/04/20 03/11/20  Noe Gens, PA-C  SYNTHROID 75 MCG tablet TAKE 1 TABLET BY MOUTH ONCE DAILY BEFORE BREAKFAST 12/24/19   Mosie Lukes, MD  Vitamin D, Ergocalciferol, (DRISDOL) 1.25 MG (50000 UNIT) CAPS capsule Take 1 capsule (50,000 Units total) by mouth every 7 (seven) days. Patient not taking: Reported on 12/17/2019  11/06/19   Mosie Lukes, MD    Family History Family History  Problem Relation Age of Onset  . Hypertension Mother   . Arthritis Mother   . Hyperlipidemia Mother   . Stroke Mother        1st age 18, then recurrent 92  . Atrial fibrillation Mother   . Epilepsy Mother   . Arthritis Father   . Breast cancer Sister   . Cancer Sister        Recurrent Breast CA  . Atrial fibrillation Brother   . Von Willebrand disease Daughter   . Other Daughter        POTS  . Factor V Leiden deficiency Daughter   . Breast cancer Other   . Drug abuse Maternal Aunt   . Diabetes Neg Hx   . Heart attack Neg Hx   . Colon cancer Neg Hx   . Esophageal cancer Neg Hx   . Rectal cancer Neg Hx   . Stomach cancer Neg Hx     Social History Social History   Tobacco Use  . Smoking status: Former Smoker    Packs/day: 0.50    Years: 12.00    Pack years: 6.00    Quit date: 02/06/1993    Years since quitting: 27.0  . Smokeless tobacco: Never Used  Substance Use Topics  . Alcohol use: No  . Drug use: No     Allergies   Penicillins and Statins   Review of Systems Review of Systems  Constitutional: Negative for chills and fever.  Gastrointestinal: Positive for abdominal pain (Right lower). Negative for diarrhea, nausea and vomiting.  Genitourinary: Negative for dysuria, flank pain and frequency.  Musculoskeletal: Positive for back pain and myalgias.     Physical Exam Triage Vital Signs ED Triage Vitals  Enc Vitals Group     BP 03/04/20 1332 (!) 151/73     Pulse Rate 03/04/20 1332 71     Resp 03/04/20 1332 19     Temp 03/04/20 1332 97.9 F (36.6 C)     Temp Source 03/04/20 1332 Oral     SpO2 03/04/20 1332 99 %     Weight --      Height --      Head Circumference --      Peak Flow --      Pain Score 03/04/20 1329 2     Pain Loc --      Pain Edu? --  Excl. in GC? --    No data found.  Updated Vital Signs BP (!) 151/73 (BP Location: Right Arm) Comment: pt forgot to take  losartan this morning  Pulse 71   Temp 97.9 F (36.6 C) (Oral)   Resp 19   SpO2 99%   Visual Acuity Right Eye Distance:   Left Eye Distance:   Bilateral Distance:    Right Eye Near:   Left Eye Near:    Bilateral Near:     Physical Exam Vitals and nursing note reviewed.  Constitutional:      Appearance: Normal appearance. She is well-developed.  HENT:     Head: Normocephalic and atraumatic.  Cardiovascular:     Rate and Rhythm: Normal rate and regular rhythm.  Pulmonary:     Effort: Pulmonary effort is normal. No respiratory distress.     Breath sounds: Normal breath sounds. No stridor. No wheezing, rhonchi or rales.  Abdominal:     General: There is no distension.     Palpations: Abdomen is soft.     Tenderness: There is abdominal tenderness. There is no right CVA tenderness or left CVA tenderness.    Musculoskeletal:        General: Normal range of motion.     Cervical back: Normal range of motion. Tenderness present. No bony tenderness.       Back:     Comments: No spinal tenderness. Full ROM upper and lower extremities.   Skin:    General: Skin is warm and dry.  Neurological:     Mental Status: She is alert and oriented to person, place, and time.  Psychiatric:        Behavior: Behavior normal.      UC Treatments / Results  Labs (all labs ordered are listed, but only abnormal results are displayed) Labs Reviewed  POCT URINALYSIS DIP (MANUAL ENTRY) - Abnormal; Notable for the following components:      Result Value   Nitrite, UA Positive (*)    Leukocytes, UA Trace (*)    All other components within normal limits  URINE CULTURE  COMPLETE METABOLIC PANEL WITH GFR  POCT CBC W AUTO DIFF ([facility])    EKG   Radiology No results found.  Procedures Procedures (including critical care time)  Medications Ordered in UC Medications - No data to display  Initial Impression / Assessment and Plan / UC Course  I have reviewed the triage vital  signs and the nursing notes.  Pertinent labs & imaging results that were available during my care of the patient were reviewed by me and considered in my medical decision making (see chart for details).     UA c/w UTI Culture sent Will start pt on Macrobid while culture pending, pt allergic to PCN (swelling, trouble breathing), has done well with Macrobid in the past. Back pain c/w muscle strain/spasm Due to relation of pain and eating, CMP ordered to help r/o gallbladder component. No RUQ tenderness on exam. Encouraged f/u with PCP next week if not improving. Discussed symptoms that warrant emergent care in the ED. AVS provided   Final Clinical Impressions(s) / UC Diagnoses   Final diagnoses:  Right groin pain  Upper back pain on right side  Nausea without vomiting  Acute cystitis without hematuria  Flank pain     Discharge Instructions      Please take your antibiotic as prescribed. A urine culture has been sent to check the severity of your urinary infection and to  determine if you are on the most appropriate antibiotic. The results should come back within 2-3 days. You will only be notified if a medication change is indicated.  Please follow up with family medicine or urology if not improving within 1 week, sooner if symptoms worsening.   You will be notified of the rest of your blood work tomorrow only if it is abnormal and further testing is recommended.  You can view all results on your free [facility] account.  Follow up with family medicine next week if not improving. Call 911 or go to the hospital if symptoms worsening- worsening pain, unable to keep down fluids, dizziness, passing out, or other new concerning symptoms develop.     ED Prescriptions    Medication Sig Dispense Auth. Provider   nitrofurantoin, macrocrystal-monohydrate, (MACROBID) 100 MG capsule Take 1 capsule (100 mg total) by mouth 2 (two) times daily for 7 days. 14 capsule Noe Gens,  [place]     PDMP not reviewed this encounter.   Noe Gens, PA-C 03/04/20 1758

## 2020-03-05 LAB — COMPLETE METABOLIC PANEL WITH GFR
AG Ratio: 1.5 (calc) (ref 1.0–2.5)
ALT: 15 U/L (ref 6–29)
AST: 22 U/L (ref 10–35)
Albumin: 4 g/dL (ref 3.6–5.1)
Alkaline phosphatase (APISO): 87 U/L (ref 37–153)
BUN: 15 mg/dL (ref 7–25)
CO2: 24 mmol/L (ref 20–32)
Calcium: 9.3 mg/dL (ref 8.6–10.4)
Chloride: 105 mmol/L (ref 98–110)
Creat: 0.77 mg/dL (ref 0.50–0.99)
GFR, Est African American: 93 mL/min/{1.73_m2} (ref >=60)
GFR, Est Non African American: 80 mL/min/{1.73_m2} (ref >=60)
Globulin: 2.6 g/dL (calc) (ref 1.9–3.7)
Glucose, Bld: 133 mg/dL — ABNORMAL HIGH (ref 65–99)
Potassium: 4 mmol/L (ref 3.5–5.3)
Sodium: 138 mmol/L (ref 135–146)
Total Bilirubin: 0.6 mg/dL (ref 0.2–1.2)
Total Protein: 6.6 g/dL (ref 6.1–8.1)

## 2020-03-06 LAB — URINE CULTURE
MICRO NUMBER:: 10554871
SPECIMEN QUALITY:: ADEQUATE

## 2020-03-07 ENCOUNTER — Telehealth: Payer: Self-pay

## 2020-03-07 NOTE — Telephone Encounter (Signed)
Patient evaluated at [facility].    [facility] - Client Nonclinical Telephone Record AccessNurse Client [facility] - Client Client Site [facility] - Night Physician Penni Homans - MD Contact Type Call Who Is Calling Patient / Member / Family / Caregiver Caller Name [name] Phone Number [phone] Patient Name Lindsay Hodges Patient DOB [birth year] Call Type Message Only Information Provided Reason for Call Request to Schedule Office Appointment Initial Comment caller wants to know if the doctor can speak with her today, she is having gallbladder and wanted to speak with the office about it Additional Comment Disp. Time Disposition Final User 03/04/2020 7:04:30 AM General Information Provided Yes Kenton Kingfisher, Lanette Call Closed By: Nelia Shi Transaction Date/Time: 03/04/2020 7:01:44 AM (ET)

## 2020-03-24 ENCOUNTER — Other Ambulatory Visit: Payer: Self-pay | Admitting: Family Medicine

## 2020-05-03 ENCOUNTER — Telehealth: Payer: Self-pay | Admitting: Family Medicine

## 2020-05-03 DIAGNOSIS — E559 Vitamin D deficiency, unspecified: Secondary | ICD-10-CM

## 2020-05-03 DIAGNOSIS — I10 Essential (primary) hypertension: Secondary | ICD-10-CM

## 2020-05-03 DIAGNOSIS — D649 Anemia, unspecified: Secondary | ICD-10-CM

## 2020-05-03 DIAGNOSIS — E039 Hypothyroidism, unspecified: Secondary | ICD-10-CM

## 2020-05-03 DIAGNOSIS — E7800 Pure hypercholesterolemia, unspecified: Secondary | ICD-10-CM

## 2020-05-03 NOTE — Telephone Encounter (Signed)
If this is ok do you want to add anything else to the order?  Last lab drawn in February.

## 2020-05-03 NOTE — Telephone Encounter (Signed)
Check CBC, CMP, TSH, lipid and vitamin d levels

## 2020-05-03 NOTE — Telephone Encounter (Signed)
New message:   Pt would like to know if lab work can be ordered to check her thyroid and vitamin d levels since her appt was canceled for 05/05/20. Please advise.

## 2020-05-04 NOTE — Telephone Encounter (Signed)
Left message on machine to call back to schedule lab only appointment.  Future orders placed.

## 2020-05-05 ENCOUNTER — Ambulatory Visit: Payer: Medicare Other | Admitting: Family Medicine

## 2020-05-13 ENCOUNTER — Other Ambulatory Visit (INDEPENDENT_AMBULATORY_CARE_PROVIDER_SITE_OTHER): Payer: Medicare Other

## 2020-05-13 ENCOUNTER — Other Ambulatory Visit: Payer: Self-pay

## 2020-05-13 DIAGNOSIS — I10 Essential (primary) hypertension: Secondary | ICD-10-CM

## 2020-05-13 DIAGNOSIS — E559 Vitamin D deficiency, unspecified: Secondary | ICD-10-CM | POA: Diagnosis not present

## 2020-05-13 DIAGNOSIS — E039 Hypothyroidism, unspecified: Secondary | ICD-10-CM

## 2020-05-13 DIAGNOSIS — E7800 Pure hypercholesterolemia, unspecified: Secondary | ICD-10-CM

## 2020-05-13 DIAGNOSIS — D649 Anemia, unspecified: Secondary | ICD-10-CM

## 2020-05-13 LAB — LIPID PANEL
Cholesterol: 120 mg/dL (ref 0–200)
HDL: 71.3 mg/dL (ref >39.00)
LDL Cholesterol: 31 mg/dL (ref 0–99)
NonHDL: 49.07
Total CHOL/HDL Ratio: 2
Triglycerides: 91 mg/dL (ref 0.0–149.0)
VLDL: 18.2 mg/dL (ref 0.0–40.0)

## 2020-05-13 LAB — COMPREHENSIVE METABOLIC PANEL
ALT: 15 U/L (ref 0–35)
AST: 22 U/L (ref 0–37)
Albumin: 4.1 g/dL (ref 3.5–5.2)
Alkaline Phosphatase: 93 U/L (ref 39–117)
BUN: 13 mg/dL (ref 6–23)
CO2: 29 mEq/L (ref 19–32)
Calcium: 9.4 mg/dL (ref 8.4–10.5)
Chloride: 104 mEq/L (ref 96–112)
Creatinine, Ser: 0.84 mg/dL (ref 0.40–1.20)
GFR: 67.76 mL/min (ref >60.00)
Glucose, Bld: 87 mg/dL (ref 70–99)
Potassium: 4.1 mEq/L (ref 3.5–5.1)
Sodium: 139 mEq/L (ref 135–145)
Total Bilirubin: 0.6 mg/dL (ref 0.2–1.2)
Total Protein: 6.9 g/dL (ref 6.0–8.3)

## 2020-05-13 LAB — CBC
HCT: 37.9 % (ref 36.0–46.0)
Hemoglobin: 12.3 g/dL (ref 12.0–15.0)
MCHC: 32.4 g/dL (ref 30.0–36.0)
MCV: 89.4 fl (ref 78.0–100.0)
Platelets: 213 10*3/uL (ref 150.0–400.0)
RBC: 4.24 Mil/uL (ref 3.87–5.11)
RDW: 13.8 % (ref 11.5–15.5)
WBC: 5.8 10*3/uL (ref 4.0–10.5)

## 2020-05-13 LAB — VITAMIN D 25 HYDROXY (VIT D DEFICIENCY, FRACTURES): VITD: 30.81 ng/mL (ref 30.00–100.00)

## 2020-05-13 LAB — TSH: TSH: 2.31 u[IU]/mL (ref 0.35–4.50)

## 2020-06-09 ENCOUNTER — Telehealth: Payer: Self-pay | Admitting: *Deleted

## 2020-06-09 NOTE — Telephone Encounter (Signed)
Left reminder message of 06-10-20 appt for [facility] week 16 visit. Gave garage code of [id number].

## 2020-06-10 ENCOUNTER — Encounter: Payer: Medicare Other | Admitting: *Deleted

## 2020-06-10 ENCOUNTER — Other Ambulatory Visit: Payer: Self-pay

## 2020-06-10 DIAGNOSIS — Z006 Encounter for examination for normal comparison and control in clinical research program: Secondary | ICD-10-CM

## 2020-06-10 NOTE — Research (Addendum)
[facility] Week 16 Visit  Patient Name Lindsay Hodges Subject ID#   Visit Date 06/10/20  Non-Fatal Potential Endpoint Assessment Yes  No   Has the subject experienced/undergone any of the following since the last visit/contact?  []  [x]   Any Coronary Artery Revascularization/Cerebrovascular Revascularization/ Peripheral Artery Revascularization/Amputation Procedure  []  [x]   Myocardial Infarction []  [x]   Stroke  []  x  Provide the date for the non-fatal Potential Endpoints status:  []  []     Treatment compliance: Date returned: 06/10/20 Number returned from box# [id number]   3 Number returned from box# [id number]   4 Number returned-total: 7 Number of Full administrations: 7   Number of partial administrations:0 Number of non administrations:0 Number of unknown administrations:0  Reason for any partial or non administrations: Number due to non compliance: Number due to device complaint: Number due to other:  IP Dispensation: Date dispensed 06/10/20 Number dispensed- total: 8 Number dispensed home to patient: 7 Box # [id number] Box # [id number]  IP Administration in Clinic: Box # [id number] Time removed form refrigerator 0820 Administration time 0973 [x] AM [] PM in clinic Quantity Administered    [] None [] Partial [x] Full Reason for None or Partial Dose ____ Administration Site [] Abdomen [] Arm [x] Thigh Laterality [x] Left  [] Right Administered by [x] Patient [] Caregiver

## 2020-06-21 DIAGNOSIS — Z23 Encounter for immunization: Secondary | ICD-10-CM | POA: Diagnosis not present

## 2020-06-25 ENCOUNTER — Other Ambulatory Visit: Payer: Self-pay | Admitting: Family Medicine

## 2020-07-21 ENCOUNTER — Encounter: Payer: Self-pay | Admitting: Family Medicine

## 2020-07-21 ENCOUNTER — Other Ambulatory Visit: Payer: Self-pay

## 2020-07-21 ENCOUNTER — Ambulatory Visit (INDEPENDENT_AMBULATORY_CARE_PROVIDER_SITE_OTHER): Payer: Medicare Other | Admitting: Family Medicine

## 2020-07-21 VITALS — BP 130/70 | HR 73 | Ht 65.0 in | Wt 148.6 lb

## 2020-07-21 DIAGNOSIS — I6523 Occlusion and stenosis of bilateral carotid arteries: Secondary | ICD-10-CM

## 2020-07-21 DIAGNOSIS — M7918 Myalgia, other site: Secondary | ICD-10-CM | POA: Diagnosis not present

## 2020-07-21 NOTE — Progress Notes (Signed)
    Subjective:    CC: Back pain  I, Lindsay Hodges, LAT, ATC, am serving as scribe for Dr. Lynne Leader.  HPI: Pt is a 64 y/o female presenting w/ c/o mid-back and L scap and L shoulder pain.  She locates her pain to her L scap, L shoulder and mid-back.  She does indoor rowing  Radiating pain: yes proximally to the L post shoulder and down the L erector spinae numbness/tingling: yes in her back Weakness: no Aggravating factors: standing; walking for exercise Treatments tried: Advil, Tylenol; Salonpas; heat; ice  Diagnostic testing: L-spine XR- 08/02/15  Pertinent review of Systems: No fevers or chills  Relevant historical information: PVD, hypertension   Objective:    Vitals:   07/21/20 1434  BP: 130/70  Pulse: 73  SpO2: 97%   General: Well Developed, well nourished, and in no acute distress.   MSK: C-spine normal-appearing nontender midline normal cervical motion.  Upper semistrength reflexes and sensation are equal normal throughout. T-spine normal-appearing nontender midline.  Tender palpation bilateral thoracic paraspinal musculature and rhomboids. Normal thoracic motion.  Normal scapular motion and strength. Pulses cap refill and sensation intact bilateral upper extremities.    Impression and Recommendations:    Assessment and Plan: 64 y.o. female with bilateral left worse than right periscapular pain thought to be rhomboid strain.  Plan for physical therapy heating pad TENS unit and.  Discussed muscle relaxers patient declined which I agree with.  Recheck near future if not improving.  Consider x-ray in the near future if not improving.Marland Kitchen  PDMP not reviewed this encounter. Orders Placed This Encounter  Procedures  . Ambulatory referral to Physical Therapy    Referral Priority:   Routine    Referral Type:   Physical Medicine    Referral Reason:   Specialty Services Required    Requested Specialty:   Physical Therapy   No orders of the defined types were  placed in this encounter.   Discussed warning signs or symptoms. Please see discharge instructions. Patient expresses understanding.   The above documentation has been reviewed and is accurate and complete Lynne Leader, M.D.

## 2020-07-21 NOTE — Patient Instructions (Addendum)
Thank you for coming in today.  Plan for PT.   Heating pad and TENS unit can help.   If not improving will get xray and evaluate further. [facility] be [facility] at [facility]. .    Recheck if not improving.   Tylenol is first line for pain.  Advil should be using sparingly for pain as second line.

## 2020-08-15 ENCOUNTER — Ambulatory Visit: Payer: Medicare Other | Admitting: Physical Therapy

## 2020-08-17 ENCOUNTER — Encounter: Payer: Self-pay | Admitting: Family Medicine

## 2020-08-17 ENCOUNTER — Telehealth: Payer: Self-pay | Admitting: Family Medicine

## 2020-08-17 DIAGNOSIS — M7918 Myalgia, other site: Secondary | ICD-10-CM

## 2020-08-17 NOTE — Telephone Encounter (Signed)
Patient called asking if the xray could be ordered for her and sent to the [facility] in [place].  She said that she would like to have the xray done to make sure that there is nothing else going on before she works on starting PT. She said that this was mentioned by Dr Georgina Snell previously.  Please advise.

## 2020-08-17 NOTE — Telephone Encounter (Signed)
X-ray cervical spine and thoracic spine ordered.  [facility] location at [phone].  You should be able to walk-in without scheduling however reasonable to double check with them.

## 2020-08-17 NOTE — Telephone Encounter (Signed)
Called pt and relayed Dr Clovis Riley advice regarding c-spine and T-spine X-rays that have been ordered to [facility].

## 2020-08-18 ENCOUNTER — Other Ambulatory Visit: Payer: Self-pay

## 2020-08-18 ENCOUNTER — Ambulatory Visit (INDEPENDENT_AMBULATORY_CARE_PROVIDER_SITE_OTHER): Payer: Medicare Other

## 2020-08-18 DIAGNOSIS — M5030 Other cervical disc degeneration, unspecified cervical region: Secondary | ICD-10-CM

## 2020-08-18 DIAGNOSIS — M47814 Spondylosis without myelopathy or radiculopathy, thoracic region: Secondary | ICD-10-CM | POA: Diagnosis not present

## 2020-08-18 DIAGNOSIS — M25512 Pain in left shoulder: Secondary | ICD-10-CM | POA: Diagnosis not present

## 2020-08-18 DIAGNOSIS — M7918 Myalgia, other site: Secondary | ICD-10-CM

## 2020-08-18 DIAGNOSIS — M2578 Osteophyte, vertebrae: Secondary | ICD-10-CM | POA: Diagnosis not present

## 2020-08-18 DIAGNOSIS — M5134 Other intervertebral disc degeneration, thoracic region: Secondary | ICD-10-CM

## 2020-08-18 DIAGNOSIS — M47812 Spondylosis without myelopathy or radiculopathy, cervical region: Secondary | ICD-10-CM | POA: Diagnosis not present

## 2020-08-19 ENCOUNTER — Other Ambulatory Visit: Payer: Self-pay | Admitting: Cardiology

## 2020-08-19 DIAGNOSIS — I10 Essential (primary) hypertension: Secondary | ICD-10-CM

## 2020-08-19 NOTE — Progress Notes (Signed)
X-ray thoracic spine shows some arthritis but otherwise looks normal to radiology

## 2020-08-19 NOTE — Progress Notes (Signed)
X-ray cervical spine shows some arthritis but no fractures or significant new or severe changes.

## 2020-09-05 DIAGNOSIS — D3132 Benign neoplasm of left choroid: Secondary | ICD-10-CM | POA: Diagnosis not present

## 2020-09-22 ENCOUNTER — Other Ambulatory Visit: Payer: Self-pay | Admitting: *Deleted

## 2020-09-22 ENCOUNTER — Other Ambulatory Visit: Payer: Self-pay | Admitting: Family Medicine

## 2020-09-22 ENCOUNTER — Telehealth: Payer: Self-pay | Admitting: Family Medicine

## 2020-09-22 MED ORDER — SYNTHROID 75 MCG PO TABS: 75.0000 ug | ORAL_TABLET | Freq: Every day | ORAL | 0 refills | Status: DC

## 2020-09-22 NOTE — Telephone Encounter (Signed)
Rx sent in to get her through til her appointment.

## 2020-09-22 NOTE — Telephone Encounter (Signed)
Patient is requesting enough medication sent to pharmacy until her appointment in 11/2020.   SYNTHROID 75 MCG tablet [[id number]]

## 2020-09-28 ENCOUNTER — Telehealth: Payer: Medicare Other | Admitting: Internal Medicine

## 2020-09-28 ENCOUNTER — Other Ambulatory Visit: Payer: Self-pay

## 2020-09-28 DIAGNOSIS — L820 Inflamed seborrheic keratosis: Secondary | ICD-10-CM | POA: Diagnosis not present

## 2020-09-28 DIAGNOSIS — D1801 Hemangioma of skin and subcutaneous tissue: Secondary | ICD-10-CM | POA: Diagnosis not present

## 2020-09-28 DIAGNOSIS — C4441 Basal cell carcinoma of skin of scalp and neck: Secondary | ICD-10-CM | POA: Diagnosis not present

## 2020-09-28 DIAGNOSIS — L578 Other skin changes due to chronic exposure to nonionizing radiation: Secondary | ICD-10-CM | POA: Diagnosis not present

## 2020-09-29 ENCOUNTER — Other Ambulatory Visit: Payer: Self-pay

## 2020-09-29 ENCOUNTER — Emergency Department (INDEPENDENT_AMBULATORY_CARE_PROVIDER_SITE_OTHER)
Admission: EM | Admit: 2020-09-29 | Discharge: 2020-09-29 | Disposition: A | Discharge status: Home / Self Care | Payer: Medicare Other | Source: Home / Self Care

## 2020-09-29 ENCOUNTER — Encounter: Payer: Self-pay | Admitting: Emergency Medicine

## 2020-09-29 DIAGNOSIS — J069 Acute upper respiratory infection, unspecified: Secondary | ICD-10-CM

## 2020-09-29 DIAGNOSIS — H6692 Otitis media, unspecified, left ear: Secondary | ICD-10-CM | POA: Diagnosis not present

## 2020-09-29 MED ORDER — DOXYCYCLINE HYCLATE 100 MG PO CAPS
100.0000 mg | ORAL_CAPSULE | Freq: Two times a day (BID) | ORAL | 0 refills | Status: AC
Start: 2020-09-29 — End: 2020-10-06

## 2020-09-29 MED ORDER — FLUTICASONE PROPIONATE 50 MCG/ACT NA SUSP
2.0000 | Freq: Every day | NASAL | 2 refills | Status: DC
Start: 2020-09-29 — End: 2020-11-09

## 2020-09-29 NOTE — Discharge Instructions (Signed)
  You may take 500mg acetaminophen every 4-6 hours or in combination with ibuprofen 400-600mg every 6-8 hours as needed for pain, inflammation, and fever.  Be sure to well hydrated with clear liquids and get at least 8 hours of sleep at night, preferably more while sick.   Please follow up with family medicine in 1 week if needed.   

## 2020-09-29 NOTE — ED Provider Notes (Signed)
[name]    CSN: [id number] Arrival date & time: 09/29/20  0804      History   Chief Complaint Chief Complaint  Patient presents with  . Sore Throat    HPI Lindsay Hodges is a 64 y.o. female.   HPI  Lindsay Hodges is a 64 y.o. female presenting to UC with c/o sore throat, Left ear pain and mildly productive cough for 6 days. Ear pain is aching and sore. She has taken tylenol with moderate relief. Denies fever, chills, n/v/d. No chest pain or SOB. vaccinated for COVID including booster and flu vaccine. No known sick contacts.    Past Medical History:  Diagnosis Date  . Arthritis   . Bursitis of hip 08/02/15  . Carotid disease, bilateral ([place])    [place] 10/15: R 40-59%, L 50-69%, stable for years  . Elevated sed rate    remote history of myelosuppressive disorder  . Grave's disease    s/p I-131 ablation 1990s  . Hyperlipidemia   . Hypertension   . HYPOTHYROIDISM   . Left knee pain 03/26/2014  . Personal history of colonic adenoma 11/30/2002  . Preventative health care 09/04/2015  . Thrombophlebitis 10/16/2016  . Vitamin D deficiency     Patient Active Problem List   Diagnosis Date Noted  . Peripheral vascular disease ([place]) 11/09/2019  . Elevated liver enzymes 05/04/2019  . Insomnia 03/15/2019  . Flank pain 11/03/2018  . Fall 02/25/2018  . Elevated sed rate   . Cervical cancer screening 03/01/2017  . Hyperglycemia 10/16/2016  . Plantar fasciitis, left 01/10/2016  . Preventative health care 09/04/2015  . Sun-damaged skin 09/04/2015  . Bursitis of hip 08/02/2015  . Low back pain radiating to both legs 03/30/2015  . Trochanteric bursitis of right hip 02/14/2015  . Left knee pain 03/26/2014  . Seasonal affective disorder ([place]) 11/25/2012  . Vitamin D deficiency 02/14/2012  . Hyperlipidemia   . Hypertension   . Graves' disease   . Arthritis   . Carotid disease, bilateral ([place])   . CAROTID BRUIT, LEFT 04/11/2009  . Hypothyroidism 01/06/2009  .  Personal history of colonic adenoma 11/30/2002    Past Surgical History:  Procedure Laterality Date  . COLONOSCOPY    . Jaw surgery  2001  . OOPHORECTOMY Bilateral   . POLYPECTOMY    . VAGINAL HYSTERECTOMY  2009   vaginal hysterectomy    OB History    Gravida  3   Para  3   Term      Preterm      AB      Living  3     SAB      IAB      Ectopic      Multiple  1   Live Births               Home Medications    Prior to Admission medications   Medication Sig Start Date End Date Taking? Authorizing Provider  doxycycline (VIBRAMYCIN) 100 MG capsule Take 1 capsule (100 mg total) by mouth 2 (two) times daily for 7 days. 09/29/20 10/06/20 Yes Kayston Jodoin O, PA-C  fluticasone (FLONASE) 50 MCG/ACT nasal spray Place 2 sprays into both nostrils daily. 09/29/20  Yes Chalene Treu O, PA-C  amLODipine (NORVASC) 2.5 MG tablet Take 1 tablet (2.5 mg total) by mouth daily. 10/14/19 01/12/20  Lelon Perla, MD  aspirin EC 81 MG tablet Take 81 mg by mouth daily. Patient not taking: Reported  on 07/21/2020    [provider]  ezetimibe (ZETIA) 10 MG tablet Take 1 tablet (10 mg total) by mouth daily. 10/15/19 01/13/20  Lewayne Bunting, MD  losartan (COZAAR) 100 MG tablet Take 1 tablet by mouth once daily 08/19/20   Lewayne Bunting, MD  SYNTHROID 75 MCG tablet Take 1 tablet (75 mcg total) by mouth daily before breakfast. 09/22/20   Bradd Canary, MD    Family History Family History  Problem Relation Age of Onset  . Hypertension Mother   . Arthritis Mother   . Hyperlipidemia Mother   . Stroke Mother        1st age 15, then recurrent 9  . Atrial fibrillation Mother   . Epilepsy Mother   . Arthritis Father   . Breast cancer Sister   . Cancer Sister        Recurrent Breast CA  . Atrial fibrillation Brother   . Von Willebrand disease Daughter   . Other Daughter        POTS  . Factor V Leiden deficiency Daughter   . Breast cancer Other   . Drug abuse  Maternal Aunt   . Diabetes Neg Hx   . Heart attack Neg Hx   . Colon cancer Neg Hx   . Esophageal cancer Neg Hx   . Rectal cancer Neg Hx   . Stomach cancer Neg Hx     Social History Social History   Tobacco Use  . Smoking status: Former Smoker    Packs/day: 0.50    Years: 12.00    Pack years: 6.00    Quit date: 02/06/1993    Years since quitting: 27.6  . Smokeless tobacco: Never Used  Vaping Use  . Vaping Use: Never used  Substance Use Topics  . Alcohol use: No  . Drug use: No     Allergies   Penicillins and Statins   Review of Systems Review of Systems  Constitutional: Negative for chills and fever.  HENT: Positive for congestion, ear pain, postnasal drip, sinus pressure and sore throat. Negative for trouble swallowing and voice change.   Respiratory: Positive for cough. Negative for shortness of breath.   Cardiovascular: Negative for chest pain and palpitations.  Gastrointestinal: Negative for abdominal pain, diarrhea, nausea and vomiting.  Musculoskeletal: Negative for arthralgias, back pain and myalgias.  Skin: Negative for rash.  All other systems reviewed and are negative.    Physical Exam Triage Vital Signs ED Triage Vitals  Enc Vitals Group     BP 09/29/20 0827 (!) 166/77     Pulse Rate 09/29/20 0827 63     Resp --      Temp 09/29/20 0827 97.9 F (36.6 C)     Temp Source 09/29/20 0827 Oral     SpO2 09/29/20 0827 99 %     Weight 09/29/20 0828 147 lb (66.7 kg)     Height 09/29/20 0828 5\' 5"  (1.651 m)     Head Circumference --      Peak Flow --      Pain Score 09/29/20 0827 5     Pain Loc --      Pain Edu? --      Excl. in GC? --    No data found.  Updated Vital Signs BP (!) 166/77 (BP Location: Right Arm)   Pulse 63   Temp 97.9 F (36.6 C) (Oral)   Ht 5\' 5"  (1.651 m)   Wt 147 lb (66.7 kg)  SpO2 99%   BMI 24.46 kg/m   Visual Acuity Right Eye Distance:   Left Eye Distance:   Bilateral Distance:    Right Eye Near:   Left Eye Near:     Bilateral Near:     Physical Exam Vitals and nursing note reviewed.  Constitutional:      General: She is not in acute distress.    Appearance: She is well-developed and well-nourished. She is not ill-appearing, toxic-appearing or diaphoretic.  HENT:     Head: Normocephalic and atraumatic.     Right Ear: Ear canal normal. A middle ear effusion is present.     Left Ear: Ear canal normal. A middle ear effusion is present. Tympanic membrane is injected. Tympanic membrane is not bulging.     Nose: Nose normal.     Right Sinus: No maxillary sinus tenderness or frontal sinus tenderness.     Left Sinus: No maxillary sinus tenderness or frontal sinus tenderness.     Mouth/Throat:     Lips: Pink.     Mouth: Mucous membranes are moist.     Pharynx: Oropharynx is clear. Uvula midline.  Eyes:     Extraocular Movements: EOM normal.  Cardiovascular:     Rate and Rhythm: Normal rate and regular rhythm.  Pulmonary:     Effort: Pulmonary effort is normal. No respiratory distress.     Breath sounds: Normal breath sounds. No stridor. No wheezing, rhonchi or rales.  Musculoskeletal:        General: Normal range of motion.     Cervical back: Normal range of motion and neck supple.  Lymphadenopathy:     Cervical: No cervical adenopathy.  Skin:    General: Skin is warm and dry.  Neurological:     Mental Status: She is alert and oriented to person, place, and time.  Psychiatric:        Mood and Affect: Mood and affect normal.        Behavior: Behavior normal.      UC Treatments / Results  Labs (all labs ordered are listed, but only abnormal results are displayed) Labs Reviewed - No data to display  EKG   Radiology No results found.  Procedures Procedures (including critical care time)  Medications Ordered in UC Medications - No data to display  Initial Impression / Assessment and Plan / UC Course  I have reviewed the triage vital signs and the nursing notes.  Pertinent labs &  imaging results that were available during my care of the patient were reviewed by me and considered in my medical decision making (see chart for details).     Hx and exam c/w early Left AOM secondary to URI Rx: doxycycline (allergic to PCN) Home care discussed  AVS given  Final Clinical Impressions(s) / UC Diagnoses   Final diagnoses:  Left acute otitis media  Acute upper respiratory infection     Discharge Instructions      You may take 500mg  acetaminophen every 4-6 hours or in combination with ibuprofen 400-600mg  every 6-8 hours as needed for pain, inflammation, and fever.  Be sure to well hydrated with clear liquids and get at least 8 hours of sleep at night, preferably more while sick.   Please follow up with family medicine in 1 week if needed.     ED Prescriptions    Medication Sig Dispense Auth. Provider   doxycycline (VIBRAMYCIN) 100 MG capsule Take 1 capsule (100 mg total) by mouth 2 (two) times daily for  7 days. 14 capsule Osmond Steckman O, PA-C   fluticasone (FLONASE) 50 MCG/ACT nasal spray Place 2 sprays into both nostrils daily. 16 g Noe Gens, [place]     PDMP not reviewed this encounter.   Noe Gens, [place] 09/29/20 [phone]

## 2020-09-29 NOTE — ED Triage Notes (Signed)
Sore throat, lt ear pain, productive cough x 6 days. vaccinated

## 2020-10-03 ENCOUNTER — Other Ambulatory Visit: Payer: Self-pay

## 2020-10-03 ENCOUNTER — Encounter: Payer: Medicare Other | Admitting: *Deleted

## 2020-10-03 DIAGNOSIS — Z006 Encounter for examination for normal comparison and control in clinical research program: Secondary | ICD-10-CM

## 2020-10-03 NOTE — Research (Signed)
[facility] 32 Week Visit  Patient Name Lindsay Hodges Subject ID# [id number]  Visit Date 10/03/20  Treatment compliance: Date returned: 10/03/20 Number returned from box# [id number] Number returned from box#  [id number] Number returned-total: 7 Number of Full administrations:  7  Number of partial administrations:0 Number of non administrations:0 Number of unknown administrations:0  Reason for any partial or non administrations: Number due to non compliance: Number due to device complaint: Number due to other:  IP Dispensation: Date dispensed 10/03/20 Number dispensed- total: 8 Number dispensed home to patient: 8 Box # [id number] Box #  [id number] IP Administration in Clinic:  Pt want to take a 2-4 week  drug holiday. Pt is having bilateral shoulder pain and wonders if it coming from IP.  IP was not administered this clinic visit, but was dispensed. Will call pt in 2 weeks Box # Administration time ____ [] AM [] PM in clinic Quantity Administered    [] None [] Partial [] Full Reason for None or Partial Dose ____ Administration Site [] Abdomen [] Arm [] Thigh Laterality [] Left  [] Right Administered by [] Patient [] Caregiver

## 2020-10-20 ENCOUNTER — Telehealth: Payer: Self-pay | Admitting: *Deleted

## 2020-10-20 DIAGNOSIS — Z006 Encounter for examination for normal comparison and control in clinical research program: Secondary | ICD-10-CM

## 2020-10-20 NOTE — Telephone Encounter (Signed)
Pt called to inform me that she has not had any should pain since she has been off IP. Pt would like to restart IP to see if the symptoms come back. I told pt it would be fine to restart IP and to let me kno if the should pain started back after the injection. Pt is planning on taking a dose of IP on 10/24/20.

## 2020-10-26 LAB — HM MAMMOGRAPHY

## 2020-10-27 NOTE — Progress Notes (Signed)
HPI: FU cerebrovascular disease. Echocardiogram in 2004 showed normal LV function. Nuclear study January 2017 showed ejection fraction 58% and normal perfusion. Abdominal ultrasound June 2018 showed no aneurysm. Carotid Dopplers March 2021 showed 1 to 39% right and 40 to 59% left stenosis.  ABIs March 2021 normal.  Since last seen,the patient has dyspnea with more extreme activities but not with routine activities. It is relieved with rest. It is not associated with chest pain. There is no orthopnea, PND or pedal edema. There is no syncope or palpitations. There is no exertional chest pain.   Current Outpatient Medications  Medication Sig Dispense Refill  . amLODipine (NORVASC) 2.5 MG tablet Take 1 tablet (2.5 mg total) by mouth daily. 180 tablet 3  . ezetimibe (ZETIA) 10 MG tablet TAKE 1 TABLET BY MOUTH DAILY 90 tablet 3  . losartan (COZAAR) 100 MG tablet Take 1 tablet by mouth once daily 90 tablet 0  . SYNTHROID 75 MCG tablet Take 1 tablet (75 mcg total) by mouth daily before breakfast. 90 tablet 0   No current facility-administered medications for this visit.     Past Medical History:  Diagnosis Date  . Arthritis   . Bursitis of hip 08/02/15  . Carotid disease, bilateral ([place])    [place] 10/15: R 40-59%, L 50-69%, stable for years  . Elevated sed rate    remote history of myelosuppressive disorder  . Grave's disease    s/p I-131 ablation 1990s  . Hyperlipidemia   . Hypertension   . HYPOTHYROIDISM   . Left knee pain 03/26/2014  . Personal history of colonic adenoma 11/30/2002  . Preventative health care 09/04/2015  . Thrombophlebitis 10/16/2016  . Vitamin D deficiency     Past Surgical History:  Procedure Laterality Date  . COLONOSCOPY    . Jaw surgery  2001  . OOPHORECTOMY Bilateral   . POLYPECTOMY    . VAGINAL HYSTERECTOMY  2009   vaginal hysterectomy    Social History   Socioeconomic History  . Marital status: Married    Spouse name: Not on file  . Number of  children: Not on file  . Years of education: Not on file  . Highest education level: Not on file  Occupational History    Comment: [employer]  Tobacco Use  . Smoking status: Former Smoker    Packs/day: 0.50    Years: 12.00    Pack years: 6.00    Quit date: 02/06/1993    Years since quitting: 27.7  . Smokeless tobacco: Never Used  Vaping Use  . Vaping Use: Never used  Substance and Sexual Activity  . Alcohol use: No  . Drug use: No  . Sexual activity: Yes  Other Topics Concern  . Not on file  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   Financial Resource Strain: Not on file  Food Insecurity: Not on file  Transportation Needs: Not on file  Physical Activity: Not on file  Stress: Not on file  Social Connections: Not on file  Intimate Partner Violence: Not on file    Family History  Problem Relation Age of Onset  . Hypertension Mother   . Arthritis Mother   . Hyperlipidemia Mother   . Stroke Mother        1st age 45, then recurrent 14  . Atrial fibrillation Mother   . Epilepsy Mother   . Arthritis Father   . Breast cancer Sister   .  Cancer Sister        Recurrent Breast CA  . Atrial fibrillation Brother   . Von Willebrand disease Daughter   . Other Daughter        POTS  . Factor V Leiden deficiency Daughter   . Breast cancer Other   . Drug abuse Maternal Aunt   . Diabetes Neg Hx   . Heart attack Neg Hx   . Colon cancer Neg Hx   . Esophageal cancer Neg Hx   . Rectal cancer Neg Hx   . Stomach cancer Neg Hx     ROS: no fevers or chills, productive cough, hemoptysis, dysphasia, odynophagia, melena, hematochezia, dysuria, hematuria, rash, seizure activity, orthopnea, PND, pedal edema, claudication. Remaining systems are negative.  Physical Exam: Well-developed well-nourished in no acute distress.  Skin is warm and dry.  HEENT is normal.  Neck is supple.  Chest is clear to auscultation with normal expansion.   Cardiovascular exam is regular rate and rhythm.  Abdominal exam nontender or distended. No masses palpated. Extremities show no edema. neuro grossly intact  ECG-normal sinus rhythm at a rate of 60, no ST changes.  Personally reviewed  A/P  1 carotid artery disease-now followed by vascular surgery.  She will need follow-up carotid Dopplers March 2022.  Continue aspirin.  She is intolerant to statins.  2 hypertension-patient's blood pressure is borderline.  She will track this and we will advance amlodipine if needed.  3 hyperlipidemia-intolerant to statins.  Continue Zetia.  Most recent lipids August 2021 personally reviewed.  Total cholesterol 120 with LDL 31.  Kirk Ruths, MD

## 2020-11-01 ENCOUNTER — Other Ambulatory Visit: Payer: Self-pay | Admitting: Cardiology

## 2020-11-01 DIAGNOSIS — E7800 Pure hypercholesterolemia, unspecified: Secondary | ICD-10-CM

## 2020-11-07 ENCOUNTER — Telehealth: Payer: Self-pay | Admitting: *Deleted

## 2020-11-07 DIAGNOSIS — Z006 Encounter for examination for normal comparison and control in clinical research program: Secondary | ICD-10-CM

## 2020-11-07 NOTE — Telephone Encounter (Signed)
Spoke to pt via phone. Pt stated she did take a doses of IP on Oct 24, 2020  and has a little bit of pain in he shoulder blade area. Pt stated she is going to take another dose today to see if the pain gets worse. I will call pt in two week to see how she is doing. I told pt to call me if the pain worsen or has any questions or concerns. Pt verbalized understanding.

## 2020-11-09 ENCOUNTER — Encounter: Payer: Self-pay | Admitting: Cardiology

## 2020-11-09 ENCOUNTER — Ambulatory Visit (INDEPENDENT_AMBULATORY_CARE_PROVIDER_SITE_OTHER): Payer: Medicare Other | Admitting: Cardiology

## 2020-11-09 ENCOUNTER — Other Ambulatory Visit: Payer: Self-pay

## 2020-11-09 VITALS — BP 144/70 | HR 60 | Ht 65.0 in | Wt 151.1 lb

## 2020-11-09 DIAGNOSIS — I6523 Occlusion and stenosis of bilateral carotid arteries: Secondary | ICD-10-CM | POA: Diagnosis not present

## 2020-11-09 DIAGNOSIS — I10 Essential (primary) hypertension: Secondary | ICD-10-CM | POA: Diagnosis not present

## 2020-11-09 DIAGNOSIS — E7800 Pure hypercholesterolemia, unspecified: Secondary | ICD-10-CM | POA: Diagnosis not present

## 2020-11-09 MED ORDER — AMLODIPINE BESYLATE 2.5 MG PO TABS: 2.5000 mg | ORAL_TABLET | Freq: Every day | ORAL | 3 refills | Status: DC

## 2020-11-09 MED ORDER — LOSARTAN POTASSIUM 100 MG PO TABS: 100.0000 mg | ORAL_TABLET | Freq: Every day | ORAL | 3 refills | Status: DC

## 2020-11-09 MED ORDER — EZETIMIBE 10 MG PO TABS: 10.0000 mg | ORAL_TABLET | Freq: Every day | ORAL | 3 refills | Status: DC

## 2020-11-09 NOTE — Patient Instructions (Signed)

## 2020-11-14 ENCOUNTER — Encounter: Payer: Medicare Other | Admitting: Family Medicine

## 2020-11-15 ENCOUNTER — Other Ambulatory Visit: Payer: Self-pay

## 2020-11-15 ENCOUNTER — Encounter: Payer: Self-pay | Admitting: Family Medicine

## 2020-11-15 ENCOUNTER — Ambulatory Visit (INDEPENDENT_AMBULATORY_CARE_PROVIDER_SITE_OTHER): Payer: Medicare Other | Admitting: Family Medicine

## 2020-11-15 VITALS — BP 130/66 | HR 74 | Temp 98.1°F | Resp 16 | Ht 65.0 in | Wt 153.0 lb

## 2020-11-15 DIAGNOSIS — E559 Vitamin D deficiency, unspecified: Secondary | ICD-10-CM | POA: Diagnosis not present

## 2020-11-15 DIAGNOSIS — Z23 Encounter for immunization: Secondary | ICD-10-CM

## 2020-11-15 DIAGNOSIS — Z0000 Encounter for general adult medical examination without abnormal findings: Secondary | ICD-10-CM | POA: Diagnosis not present

## 2020-11-15 DIAGNOSIS — E538 Deficiency of other specified B group vitamins: Secondary | ICD-10-CM | POA: Diagnosis not present

## 2020-11-15 DIAGNOSIS — E039 Hypothyroidism, unspecified: Secondary | ICD-10-CM | POA: Diagnosis not present

## 2020-11-15 DIAGNOSIS — E7800 Pure hypercholesterolemia, unspecified: Secondary | ICD-10-CM | POA: Diagnosis not present

## 2020-11-15 DIAGNOSIS — I6523 Occlusion and stenosis of bilateral carotid arteries: Secondary | ICD-10-CM | POA: Diagnosis not present

## 2020-11-15 DIAGNOSIS — J329 Chronic sinusitis, unspecified: Secondary | ICD-10-CM | POA: Diagnosis not present

## 2020-11-15 DIAGNOSIS — R739 Hyperglycemia, unspecified: Secondary | ICD-10-CM | POA: Diagnosis not present

## 2020-11-15 DIAGNOSIS — I10 Essential (primary) hypertension: Secondary | ICD-10-CM

## 2020-11-15 LAB — TSH: TSH: 1.5 u[IU]/mL (ref 0.35–4.50)

## 2020-11-15 LAB — CBC WITH DIFFERENTIAL/PLATELET
Basophils Absolute: 0 10*3/uL (ref 0.0–0.1)
Basophils Relative: 0.6 % (ref 0.0–3.0)
Eosinophils Absolute: 0.2 10*3/uL (ref 0.0–0.7)
Eosinophils Relative: 3.6 % (ref 0.0–5.0)
HCT: 37.4 % (ref 36.0–46.0)
Hemoglobin: 12 g/dL (ref 12.0–15.0)
Lymphocytes Relative: 29.5 % (ref 12.0–46.0)
Lymphs Abs: 2.1 10*3/uL (ref 0.7–4.0)
MCHC: 32.2 g/dL (ref 30.0–36.0)
MCV: 85 fl (ref 78.0–100.0)
Monocytes Absolute: 0.7 10*3/uL (ref 0.1–1.0)
Monocytes Relative: 10.6 % (ref 3.0–12.0)
Neutro Abs: 3.9 10*3/uL (ref 1.4–7.7)
Neutrophils Relative %: 55.7 % (ref 43.0–77.0)
Platelets: 218 10*3/uL (ref 150.0–400.0)
RBC: 4.4 Mil/uL (ref 3.87–5.11)
RDW: 16.6 % — ABNORMAL HIGH (ref 11.5–15.5)
WBC: 7 10*3/uL (ref 4.0–10.5)

## 2020-11-15 LAB — COMPREHENSIVE METABOLIC PANEL
ALT: 12 U/L (ref 0–35)
AST: 17 U/L (ref 0–37)
Albumin: 4.1 g/dL (ref 3.5–5.2)
Alkaline Phosphatase: 86 U/L (ref 39–117)
BUN: 13 mg/dL (ref 6–23)
CO2: 29 mEq/L (ref 19–32)
Calcium: 9.6 mg/dL (ref 8.4–10.5)
Chloride: 103 mEq/L (ref 96–112)
Creatinine, Ser: 0.71 mg/dL (ref 0.40–1.20)
GFR: 88.32 mL/min (ref >60.00)
Glucose, Bld: 82 mg/dL (ref 70–99)
Potassium: 4 mEq/L (ref 3.5–5.1)
Sodium: 138 mEq/L (ref 135–145)
Total Bilirubin: 0.7 mg/dL (ref 0.2–1.2)
Total Protein: 7.2 g/dL (ref 6.0–8.3)

## 2020-11-15 LAB — LIPID PANEL
Cholesterol: 118 mg/dL (ref 0–200)
HDL: 67.7 mg/dL (ref >39.00)
LDL Cholesterol: 31 mg/dL (ref 0–99)
NonHDL: 49.83
Total CHOL/HDL Ratio: 2
Triglycerides: 95 mg/dL (ref 0.0–149.0)
VLDL: 19 mg/dL (ref 0.0–40.0)

## 2020-11-15 LAB — HEMOGLOBIN A1C: Hgb A1c MFr Bld: 6 % (ref 4.6–6.5)

## 2020-11-15 LAB — VITAMIN B12: Vitamin B-12: 220 pg/mL (ref 211–911)

## 2020-11-15 MED ORDER — AZITHROMYCIN 250 MG PO TABS: ORAL_TABLET | ORAL | 0 refills | Status: DC

## 2020-11-15 MED ORDER — FLUTICASONE PROPIONATE 50 MCG/ACT NA SUSP
2.0000 | Freq: Every day | NASAL | 2 refills | Status: DC
Start: 2020-11-15 — End: 2020-12-09

## 2020-11-15 NOTE — Patient Instructions (Addendum)
Zinc, Biotin and fatty acids for hair loss  Bone density shows osteopenia, which is thinner than normal but not as bad as osteoporosis. Recommend calcium intake of 1200 to 1500 mg daily, divided into roughly 3 doses. Best source is the diet and a single dairy serving is about 500 mg, a supplement of calcium citrate once or twice daily to balance diet is fine if not getting enough in diet. Also need Vitamin D 2000 IU caps, 1 cap daily if not already taking vitamin D. Also recommend weight baring exercise on hips and upper body to keep bones strong    Alopecia Areata, Adult  Alopecia areata is a condition that causes hair loss. A person with this condition may lose hair on the scalp in patches. In some cases, a person may lose all the hair on the scalp or all the hair from the face and body. Having this condition can be emotionally difficult, but it is not dangerous. Alopecia areata is an autoimmune disease. This means that your body's defense system (immune system) mistakes normal parts of the body for germs or other things that can make you sick. When you have alopecia areata, the immune system attacks the hair follicles. What are the causes? The cause of this condition is not known. What increases the risk? You are more likely to develop this condition if you have:  A family history of alopecia.  A family history of another autoimmune disease, including type 1 diabetes and thyroid autoimmune disease.  Eczema, asthma, and allergies.  Down syndrome. What are the signs or symptoms? The main symptom of this condition is round spots of patchy hair loss on the scalp. The spots may be mildly itchy. Other symptoms include:  Short dark hairs in the bald patches that are wider at the top (exclamation point hairs).  Dents, white spots, or lines in the fingernails or toenails.  Balding and body hair loss. This is rare. Alopecia areata usually develops in childhood, but it can develop at any  age. For some people, their hair grows back on its own and hair loss does not happen again. For others, their hair may fall out and grow back in cycles. The hair loss may last many years. How is this diagnosed? This condition is diagnosed based on your symptoms and family history. Your health care provider will also check your scalp skin, teeth, and nails. Your health care provider may refer you to a specialist in hair and skin disorders (dermatologist). You may also have tests, including:  A hair pull test.  Blood tests or other screening tests to check for autoimmune diseases, such as thyroid disease or diabetes.  Skin biopsy to confirm the diagnosis.  A procedure to examine the skin with a lighted magnifying instrument (dermoscopy). How is this treated? There is no cure for alopecia areata. The goals of treatment are to promote the regrowth of hair and prevent the immune system from overreacting. No single treatment is right for all people with alopecia areata. It depends on the type of hair loss you have and how severe it is. Work with your health care provider to find the best treatment for you. Treatment may include:  Regular checkups to make sure the condition is not getting worse . This is called watchful waiting.  Using steroid creams or pills for 6-8 weeks to stop the immune reaction and help hair to regrow more quickly.  Using other medicines on your skin (topical medicines) to change the immune  system response and support the hair growth cycle.  Steroid injections.  Therapy and counseling with a support group or therapist if you are having trouble coping with hair loss. Follow these instructions at home: Medicines  Apply topical creams only as told by your health care provider.  Take over-the-counter and prescription medicines only as told by your health care provider. General instructions  Learn as much as you can about your condition.  Consider getting a wig or  products to make hair look fuller or to cover bald spots, if you feel uncomfortable with your appearance.  Get therapy or counseling if you are having a hard time coping with hair loss. Ask your health care provider to recommend a counselor or support group.  Keep all follow-up visits as told by your health care provider. This is important. Where to find more information  National Alopecia Areata Foundation: naaf.org Contact a health care provider if:  Your hair loss gets worse, even with treatment.  You have new symptoms.  You are struggling emotionally. Get help right away if:  You have a sudden worsening of the hair loss. Summary  Alopecia areata is an autoimmune condition that makes your body's defense system (immune system) attack the hair follicles. This causes you to lose hair.  Having this condition can be emotionally difficult, but it is not dangerous.  Treatments may include regular checkups to make sure that the condition is not getting worse, medicines, and steroid injections. This information is not intended to replace advice given to you by your health care provider. Make sure you discuss any questions you have with your health care provider. Document Revised: 12/01/2019 Document Reviewed: 12/01/2019 Elsevier Patient Education  2021 Else[employer] Inc.  Preventive Care 65 Years and Older, Female Preventive care refers to lifestyle choices and visits with your health care provider that can promote health and wellness. This includes:  A yearly physical exam. This is also called an annual wellness visit.  Regular dental and eye exams.  Immunizations.  Screening for certain conditions.  Healthy lifestyle choices, such as: ? Eating a healthy diet. ? Getting regular exercise. ? Not using drugs or products that contain nicotine and tobacco. ? Limiting alcohol use. What can I expect for my preventive care visit? Physical exam Your health care provider will check  your:  Height and weight. These may be used to calculate your BMI (body mass index). BMI is a measurement that tells if you are at a healthy weight.  Heart rate and blood pressure.  Body temperature.  Skin for abnormal spots. Counseling Your health care provider may ask you questions about your:  Past medical problems.  Family's medical history.  Alcohol, tobacco, and drug use.  Emotional well-being.  Home life and relationship well-being.  Sexual activity.  Diet, exercise, and sleep habits.  History of falls.  Memory and ability to understand (cognition).  Work and work environment.  Pregnancy and menstrual history.  Access to firearms. What immunizations do I need? Vaccines are usually given at various ages, according to a schedule. Your health care provider will recommend vaccines for you based on your age, medical history, and lifestyle or other factors, such as travel or where you work.   What tests do I need? Blood tests  Lipid and cholesterol levels. These may be checked every 5 years, or more often depending on your overall health.  Hepatitis C test.  Hepatitis B test. Screening  Lung cancer screening. You may have this screening every year   starting at age 55 if you have a 30-pack-year history of smoking and currently smoke or have quit within the past 15 years.  Colorectal cancer screening. ? All adults should have this screening starting at age 50 and continuing until age 75. ? Your health care provider may recommend screening at age 45 if you are at increased risk. ? You will have tests every 1-10 years, depending on your results and the type of screening test.  Diabetes screening. ? This is done by checking your blood sugar (glucose) after you have not eaten for a while (fasting). ? You may have this done every 1-3 years.  Mammogram. ? This may be done every 1-2 years. ? Talk with your health care provider about how often you should have regular  mammograms.  Abdominal aortic aneurysm (AAA) screening. You may need this if you are a current or former smoker.  BRCA-related cancer screening. This may be done if you have a family history of breast, ovarian, tubal, or peritoneal cancers. Other tests  STD (sexually transmitted disease) testing, if you are at risk.  Bone density scan. This is done to screen for osteoporosis. You may have this done starting at age 65. Talk with your health care provider about your test results, treatment options, and if necessary, the need for more tests. Follow these instructions at home: Eating and drinking  Eat a diet that includes fresh fruits and vegetables, whole grains, lean protein, and low-fat dairy products. Limit your intake of foods with high amounts of sugar, saturated fats, and salt.  Take vitamin and mineral supplements as recommended by your health care provider.  Do not drink alcohol if your health care provider tells you not to drink.  If you drink alcohol: ? Limit how much you have to 0-1 drink a day. ? Be aware of how much alcohol is in your drink. In the [place] one drink equals one 12 oz bottle of beer (355 mL), one 5 oz glass of wine (148 mL), or one 1 oz glass of hard liquor (44 mL).   Lifestyle  Take daily care of your teeth and gums. Brush your teeth every morning and night with fluoride toothpaste. Floss one time each day.  Stay active. Exercise for at least 30 minutes 5 or more days each week.  Do not use any products that contain nicotine or tobacco, such as cigarettes, e-cigarettes, and chewing tobacco. If you need help quitting, ask your health care provider.  Do not use drugs.  If you are sexually active, practice safe sex. Use a condom or other form of protection in order to prevent STIs (sexually transmitted infections).  Talk with your health care provider about taking a low-dose aspirin or statin.  Find healthy ways to cope with stress, such as: ? Meditation,  yoga, or listening to music. ? Journaling. ? Talking to a trusted person. ? Spending time with friends and family. Safety  Always wear your seat belt while driving or riding in a vehicle.  Do not drive: ? If you have been drinking alcohol. Do not ride with someone who has been drinking. ? When you are tired or distracted. ? While texting.  Wear a helmet and other protective equipment during sports activities.  If you have firearms in your house, make sure you follow all gun safety procedures. What's next?  Visit your health care provider once a year for an annual wellness visit.  Ask your health care provider how often you should have your   eyes and teeth checked.  Stay up to date on all vaccines. This information is not intended to replace advice given to you by your health care provider. Make sure you discuss any questions you have with your health care provider. Document Revised: 09/07/2020 Document Reviewed: 09/11/2018 Elsevier Patient Education  2021 [place]. Melatonin 5 mg and Magnesium glycinate 400 mg for sleep NOW company botanical mix, SLEEP [employer]DBODYGREEN Sleep+ supplements   Insomnia Insomnia is a sleep disorder that makes it difficult to fall asleep or stay asleep. Insomnia can cause fatigue, low energy, difficulty concentrating, mood swings, and poor performance at work or school. There are three different ways to classify insomnia:  Difficulty falling asleep.  Difficulty staying asleep.  Waking up too early in the morning. Any type of insomnia can be long-term (chronic) or short-term (acute). Both are common. Short-term insomnia usually lasts for three months or less. Chronic insomnia occurs at least three times a week for longer than three months. What are the causes? Insomnia may be caused by another condition, situation, or substance, such as:  Anxiety.  Certain medicines.  Gastroesophageal reflux disease (GERD) or other gastrointestinal  conditions.  Asthma or other breathing conditions.  Restless legs syndrome, sleep apnea, or other sleep disorders.  Chronic pain.  Menopause.  Stroke.  Abuse of alcohol, tobacco, or illegal drugs.  Mental health conditions, such as depression.  Caffeine.  Neurological disorders, such as Alzheimer's disease.  An overactive thyroid (hyperthyroidism). Sometimes, the cause of insomnia may not be known. What increases the risk? Risk factors for insomnia include:  Gender. Women are affected more often than men.  Age. Insomnia is more common as you get older.  Stress.  Lack of exercise.  Irregular work schedule or working night shifts.  Traveling between different time zones.  Certain medical and mental health conditions. What are the signs or symptoms? If you have insomnia, the main symptom is having trouble falling asleep or having trouble staying asleep. This may lead to other symptoms, such as:  Feeling fatigued or having low energy.  Feeling nervous about going to sleep.  Not feeling rested in the morning.  Having trouble concentrating.  Feeling irritable, anxious, or depressed. How is this diagnosed? This condition may be diagnosed based on:  Your symptoms and medical history. Your health care provider may ask about: ? Your sleep habits. ? Any medical conditions you have. ? Your mental health.  A physical exam. How is this treated? Treatment for insomnia depends on the cause. Treatment may focus on treating an underlying condition that is causing insomnia. Treatment may also include:  Medicines to help you sleep.  Counseling or therapy.  Lifestyle adjustments to help you sleep better. Follow these instructions at home: Eating and drinking  Limit or avoid alcohol, caffeinated beverages, and cigarettes, especially close to bedtime. These can disrupt your sleep.  Do not eat a large meal or eat spicy foods right before bedtime. This can lead to  digestive discomfort that can make it hard for you to sleep.   Sleep habits  Keep a sleep diary to help you and your health care provider figure out what could be causing your insomnia. Write down: ? When you sleep. ? When you wake up during the night. ? How well you sleep. ? How rested you feel the next day. ? Any side effects of medicines you are taking. ? What you eat and drink.  Make your bedroom a dark, comfortable place where it is easy to  fall asleep. ? Put up shades or blackout curtains to block light from outside. ? Use a white noise machine to block noise. ? Keep the temperature cool.  Limit screen use before bedtime. This includes: ? Watching TV. ? Using your smartphone, tablet, or computer.  Stick to a routine that includes going to bed and waking up at the same times every day and night. This can help you fall asleep faster. Consider making a quiet activity, such as reading, part of your nighttime routine.  Try to avoid taking naps during the day so that you sleep better at night.  Get out of bed if you are still awake after 15 minutes of trying to sleep. Keep the lights down, but try reading or doing a quiet activity. When you feel sleepy, go back to bed.   General instructions  Take over-the-counter and prescription medicines only as told by your health care provider.  Exercise regularly, as told by your health care provider. Avoid exercise starting several hours before bedtime.  Use relaxation techniques to manage stress. Ask your health care provider to suggest some techniques that may work well for you. These may include: ? Breathing exercises. ? Routines to release muscle tension. ? Visualizing peaceful scenes.  Make sure that you drive carefully. Avoid driving if you feel very sleepy.  Keep all follow-up visits as told by your health care provider. This is important. Contact a health care provider if:  You are tired throughout the day.  You have trouble  in your daily routine due to sleepiness.  You continue to have sleep problems, or your sleep problems get worse. Get help right away if:  You have serious thoughts about hurting yourself or someone else. If you ever feel like you may hurt yourself or others, or have thoughts about taking your own life, get help right away. You can go to your nearest emergency department or call:  Your local emergency services (911 in the [place]  A suicide crisis helpline, such as the National Suicide Prevention Lifeline at 1-800-273-8255. This is open 24 hours a day. Summary  Insomnia is a sleep disorder that makes it difficult to fall asleep or stay asleep.  Insomnia can be long-term (chronic) or short-term (acute).  Treatment for insomnia depends on the cause. Treatment may focus on treating an underlying condition that is causing insomnia.  Keep a sleep diary to help you and your health care provider figure out what could be causing your insomnia. This information is not intended to replace advice given to you by your health care provider. Make sure you discuss any questions you have with your health care provider. Document Revised: 07/28/2020 Document Reviewed: 07/28/2020 Elsevier Patient Education  2021 Elsevier Inc.  

## 2020-11-15 NOTE — Assessment & Plan Note (Signed)
Supplement and monitor 

## 2020-11-15 NOTE — Assessment & Plan Note (Signed)
Encouraged heart healthy diet, increase exercise, avoid trans fats, consider a krill oil cap daily 

## 2020-11-15 NOTE — Assessment & Plan Note (Signed)
Check level,

## 2020-11-15 NOTE — Assessment & Plan Note (Addendum)
Patient encouraged to maintain heart healthy diet, regular exercise, adequate sleep. Consider daily probiotics. Take medications as prescribed. Given Pneumovax today

## 2020-11-16 DIAGNOSIS — J329 Chronic sinusitis, unspecified: Secondary | ICD-10-CM | POA: Insufficient documentation

## 2020-11-16 NOTE — Assessment & Plan Note (Signed)
Well controlled, no changes to meds. Encouraged heart healthy diet such as the DASH diet and exercise as tolerated.  °

## 2020-11-16 NOTE — Progress Notes (Signed)
Subjective:    Patient ID: Lindsay Hodges, female    DOB: 03/18/1956, 65 y.o.   MRN: [id number]  Chief Complaint  Patient presents with  . Annual Exam    HPI Patient is in today for annual preventative exam and follow up on chronic medical concerns. No recent febrile illness or hospitalizations. Is trying to maintain a heart healthy diet and stay active. She has noted increased nasdal congestion and fullness in her ears for several weeks. Her dentist noted her sinuses appeared full on recent imaging. Denies CP/palp/SOB/HA/fevers/GI or GU c/o. Taking meds as prescribed  Past Medical History:  Diagnosis Date  . Arthritis   . Bursitis of hip 08/02/15  . Carotid disease, bilateral ([place])    [place] 10/15: R 40-59%, L 50-69%, stable for years  . Elevated sed rate    remote history of myelosuppressive disorder  . Grave's disease    s/p I-131 ablation 1990s  . Hyperlipidemia   . Hypertension   . HYPOTHYROIDISM   . Left knee pain 03/26/2014  . Personal history of colonic adenoma 11/30/2002  . Preventative health care 09/04/2015  . Thrombophlebitis 10/16/2016  . Vitamin D deficiency     Past Surgical History:  Procedure Laterality Date  . COLONOSCOPY    . Jaw surgery  2001  . OOPHORECTOMY Bilateral   . POLYPECTOMY    . VAGINAL HYSTERECTOMY  2009   vaginal hysterectomy    Family History  Problem Relation Age of Onset  . Hypertension Mother   . Arthritis Mother   . Hyperlipidemia Mother   . Stroke Mother        1st age 62, then recurrent 7  . Atrial fibrillation Mother   . Epilepsy Mother   . Arthritis Father   . Breast cancer Sister   . Cancer Sister        Recurrent Breast CA  . Atrial fibrillation Brother   . Von Willebrand disease Daughter   . Other Daughter        POTS  . Factor V Leiden deficiency Daughter   . Breast cancer Other   . Drug abuse Maternal Aunt   . Diabetes Neg Hx   . Heart attack Neg Hx   . Colon cancer Neg Hx   . Esophageal cancer Neg Hx   .  Rectal cancer Neg Hx   . Stomach cancer Neg Hx     Social History   Socioeconomic History  . Marital status: Married    Spouse name: Not on file  . Number of children: Not on file  . Years of education: Not on file  . Highest education level: Not on file  Occupational History    Comment: [employer]  Tobacco Use  . Smoking status: Former Smoker    Packs/day: 0.50    Years: 12.00    Pack years: 6.00    Quit date: 02/06/1993    Years since quitting: 27.7  . Smokeless tobacco: Never Used  Vaping Use  . Vaping Use: Never used  Substance and Sexual Activity  . Alcohol use: No  . Drug use: No  . Sexual activity: Yes  Other Topics Concern  . Not on file  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   Financial Resource Strain: Not on file  Food Insecurity: Not on file  Transportation Needs: Not on file  Physical Activity: Not on file  Stress: Not on file  Social Connections: Not  on file  Intimate Partner Violence: Not on file    Outpatient Medications Prior to Visit  Medication Sig Dispense Refill  . amLODipine (NORVASC) 2.5 MG tablet Take 1 tablet (2.5 mg total) by mouth daily. 180 tablet 3  . ezetimibe (ZETIA) 10 MG tablet Take 1 tablet (10 mg total) by mouth daily. 90 tablet 3  . losartan (COZAAR) 100 MG tablet Take 1 tablet (100 mg total) by mouth daily. 90 tablet 3  . SYNTHROID 75 MCG tablet Take 1 tablet (75 mcg total) by mouth daily before breakfast. 90 tablet 0   No facility-administered medications prior to visit.    Allergies  Allergen Reactions  . Penicillins     REACTION: swelling/dyspnea  . Statins Other (See Comments)    Myalgias on several statins    Review of Systems  Constitutional: Positive for malaise/fatigue. Negative for fever.  HENT: Positive for congestion and ear pain. Negative for ear discharge, hearing loss, nosebleeds and tinnitus.   Eyes: Negative for blurred vision.  Respiratory: Positive  for sputum production. Negative for shortness of breath.   Cardiovascular: Negative for chest pain, palpitations and leg swelling.  Gastrointestinal: Negative for abdominal pain, blood in stool and nausea.  Genitourinary: Negative for dysuria and frequency.  Musculoskeletal: Negative for falls.  Skin: Negative for rash.  Neurological: Negative for dizziness, loss of consciousness and headaches.  Endo/Heme/Allergies: Negative for environmental allergies.  Psychiatric/Behavioral: Negative for depression. The patient is not nervous/anxious.        Objective:    Physical Exam Constitutional:      General: She is not in acute distress.    Appearance: She is not diaphoretic.  HENT:     Head: Normocephalic and atraumatic.     Right Ear: External ear normal.     Left Ear: External ear normal.     Nose: Nose normal.     Mouth/Throat:     Mouth: Oropharynx is clear and moist.     Pharynx: No oropharyngeal exudate.  Eyes:     General: No scleral icterus.       Right eye: No discharge.        Left eye: No discharge.     Conjunctiva/sclera: Conjunctivae normal.     Pupils: Pupils are equal, round, and reactive to light.  Neck:     Thyroid: No thyromegaly.  Cardiovascular:     Rate and Rhythm: Normal rate and regular rhythm.     Pulses: Intact distal pulses.     Heart sounds: Normal heart sounds. No murmur heard.   Pulmonary:     Effort: Pulmonary effort is normal. No respiratory distress.     Breath sounds: Normal breath sounds. No wheezing or rales.  Abdominal:     General: Bowel sounds are normal. There is no distension.     Palpations: Abdomen is soft. There is no mass.     Tenderness: There is no abdominal tenderness.  Musculoskeletal:        General: No tenderness or edema. Normal range of motion.     Cervical back: Normal range of motion and neck supple.  Lymphadenopathy:     Cervical: No cervical adenopathy.  Skin:    General: Skin is warm and dry.     Findings: No  rash.  Neurological:     Mental Status: She is alert and oriented to person, place, and time.     Cranial Nerves: No cranial nerve deficit.     Coordination: Coordination normal.  Deep Tendon Reflexes: Reflexes are normal and symmetric. Reflexes normal.     BP 130/66   Pulse 74   Temp 98.1 F (36.7 C)   Resp 16   Ht 5\' 5"  (1.651 m)   Wt 153 lb (69.4 kg)   SpO2 98%   BMI 25.46 kg/m  Wt Readings from Last 3 Encounters:  11/15/20 153 lb (69.4 kg)  11/09/20 151 lb 1.9 oz (68.5 kg)  09/29/20 147 lb (66.7 kg)    Diabetic Foot Exam - Simple   No data filed    Lab Results  Component Value Date   WBC 7.0 11/15/2020   HGB 12.0 11/15/2020   HCT 37.4 11/15/2020   PLT 218.0 11/15/2020   GLUCOSE 82 11/15/2020   CHOL 118 11/15/2020   TRIG 95.0 11/15/2020   HDL 67.70 11/15/2020   LDLCALC 31 11/15/2020   ALT 12 11/15/2020   AST 17 11/15/2020   NA 138 11/15/2020   K 4.0 11/15/2020   CL 103 11/15/2020   CREATININE 0.71 11/15/2020   BUN 13 11/15/2020   CO2 29 11/15/2020   TSH 1.50 11/15/2020   HGBA1C 6.0 11/15/2020    Lab Results  Component Value Date   TSH 1.50 11/15/2020   Lab Results  Component Value Date   WBC 7.0 11/15/2020   HGB 12.0 11/15/2020   HCT 37.4 11/15/2020   MCV 85.0 11/15/2020   PLT 218.0 11/15/2020   Lab Results  Component Value Date   NA 138 11/15/2020   K 4.0 11/15/2020   CO2 29 11/15/2020   GLUCOSE 82 11/15/2020   BUN 13 11/15/2020   CREATININE 0.71 11/15/2020   BILITOT 0.7 11/15/2020   ALKPHOS 86 11/15/2020   AST 17 11/15/2020   ALT 12 11/15/2020   PROT 7.2 11/15/2020   ALBUMIN 4.1 11/15/2020   CALCIUM 9.6 11/15/2020   GFR 88.32 11/15/2020   Lab Results  Component Value Date   CHOL 118 11/15/2020   Lab Results  Component Value Date   HDL 67.70 11/15/2020   Lab Results  Component Value Date   LDLCALC 31 11/15/2020   Lab Results  Component Value Date   TRIG 95.0 11/15/2020   Lab Results  Component Value Date    CHOLHDL 2 11/15/2020   Lab Results  Component Value Date   HGBA1C 6.0 11/15/2020       Assessment & Plan:   Problem List Items Addressed This Visit    Hypothyroidism    On Levothyroxine, continue to monitor. Is staying on Synthroid for now      Relevant Orders   TSH (Completed)   Hyperlipidemia - Primary    Encouraged heart healthy diet, increase exercise, avoid trans fats, consider a krill oil cap daily.      Relevant Orders   Lipid panel (Completed)   Hypertension    Well controlled, no changes to meds. Encouraged heart healthy diet such as the DASH diet and exercise as tolerated.       Vitamin D deficiency    Supplement and monitor      Relevant Orders   Vitamin D 1,25 dihydroxy   Preventative health care    Patient encouraged to maintain heart healthy diet, regular exercise, adequate sleep. Consider daily probiotics. Take medications as prescribed. Given Pneumovax today      Hyperglycemia   Relevant Orders   Comprehensive metabolic panel (Completed)   Hemoglobin A1c (Completed)   Low serum vitamin B12    Check level,  Relevant Orders   Vitamin B12 (Completed)   Sinusitis    She has been struggling with increased nasal congestion and fullness in her ears. She had xrays taken at her dentist secondary to pain over her upper teeth and the dentist said her sinuses appeared full. She is started on Azithromycin, flonase and nasal saline.       Relevant Medications   azithromycin (ZITHROMAX) 250 MG tablet   fluticasone (FLONASE) 50 MCG/ACT nasal spray   Other Relevant Orders   CBC with Differential/Platelet (Completed)    Other Visit Diagnoses    Need for pneumococcal vaccination       Relevant Orders   Pneumococcal polysaccharide vaccine 23-valent greater than or equal to 2yo subcutaneous/IM (Completed)      I am having Lindsay Hodges start on azithromycin and fluticasone. I am also having her maintain her Synthroid, ezetimibe, amLODipine, and  losartan.  Meds ordered this encounter  Medications  . azithromycin (ZITHROMAX) 250 MG tablet    Sig: 2 tabs po once then 1 tab po daily    Dispense:  6 tablet    Refill:  0  . fluticasone (FLONASE) 50 MCG/ACT nasal spray    Sig: Place 2 sprays into both nostrils daily.    Dispense:  16 g    Refill:  2     Penni Homans, MD

## 2020-11-16 NOTE — Assessment & Plan Note (Signed)
On Levothyroxine, continue to monitor. Is staying on Synthroid for now

## 2020-11-16 NOTE — Assessment & Plan Note (Signed)
She has been struggling with increased nasal congestion and fullness in her ears. She had xrays taken at her dentist secondary to pain over her upper teeth and the dentist said her sinuses appeared full. She is started on Azithromycin, flonase and nasal saline.

## 2020-11-17 ENCOUNTER — Encounter: Payer: Self-pay | Admitting: Family Medicine

## 2020-11-19 LAB — VITAMIN D 1,25 DIHYDROXY
Vitamin D 1, 25 (OH)2 Total: 30 pg/mL (ref 18–72)
Vitamin D2 1, 25 (OH)2: 13 pg/mL
Vitamin D3 1, 25 (OH)2: 17 pg/mL

## 2020-12-08 ENCOUNTER — Telehealth: Payer: Self-pay | Admitting: Family Medicine

## 2020-12-08 MED ORDER — SYNTHROID 75 MCG PO TABS: 75.0000 ug | ORAL_TABLET | Freq: Every day | ORAL | 3 refills | Status: DC

## 2020-12-08 NOTE — Telephone Encounter (Signed)
Patient wants medication for 1 year  Please Advise    Medication: SYNTHROID 75 MCG tablet [[id number]    Has the patient contacted their pharmacy? No. (If no, request that the patient contact the pharmacy for the refill.) (If yes, when and what did the pharmacy advise?)  Preferred Pharmacy (with phone number or street name):  [facility], [place] - [address]  [address] [place] [address]  Phone:  [phone] Fax:  [phone]  DEA #:  --  DAW Reason: --     Agent: Please be advised that RX refills may take up to 3 business days. We ask that you follow-up with your pharmacy.

## 2020-12-08 NOTE — Telephone Encounter (Signed)
Rx sent in and patient notified.  

## 2020-12-09 ENCOUNTER — Other Ambulatory Visit: Payer: Self-pay

## 2020-12-09 ENCOUNTER — Encounter: Payer: Self-pay | Admitting: Family

## 2020-12-09 ENCOUNTER — Ambulatory Visit (INDEPENDENT_AMBULATORY_CARE_PROVIDER_SITE_OTHER): Payer: Medicare Other | Admitting: Family

## 2020-12-09 VITALS — BP 139/58 | HR 90 | Temp 97.7°F | Resp 16 | Ht 64.0 in | Wt 148.0 lb

## 2020-12-09 DIAGNOSIS — M546 Pain in thoracic spine: Secondary | ICD-10-CM

## 2020-12-09 DIAGNOSIS — I6523 Occlusion and stenosis of bilateral carotid arteries: Secondary | ICD-10-CM

## 2020-12-09 MED ORDER — MELOXICAM 7.5 MG PO TABS
7.5000 mg | ORAL_TABLET | Freq: Every day | ORAL | 0 refills | Status: DC
Start: 2020-12-09 — End: 2020-12-16

## 2020-12-09 NOTE — Progress Notes (Signed)
Subjective:    Patient ID: Lindsay Hodges, female    DOB: [birth year], 65 y.o.   MRN: [id number]  HPI  Patient is a 65 yr old female who presents today with chief complaint of thoracic back pain. States that the pain "comes and goes" over the last 3 weeks. She has tried ice, heat, salon pas, tylenol/advil.  No specific movements make it worse.  She denies numbness/weakness in legs.  Denies bowel/bladder incontinence.    Review of Systems See HPI  Past Medical History:  Diagnosis Date  . Arthritis   . Bursitis of hip 08/02/15  . Carotid disease, bilateral ([place])    [place] 10/15: R 40-59%, L 50-69%, stable for years  . Elevated sed rate    remote history of myelosuppressive disorder  . Grave's disease    s/p I-131 ablation 1990s  . Hyperlipidemia   . Hypertension   . HYPOTHYROIDISM   . Left knee pain 03/26/2014  . Personal history of colonic adenoma 11/30/2002  . Preventative health care 09/04/2015  . Thrombophlebitis 10/16/2016  . Vitamin D deficiency      Social History   Socioeconomic History  . Marital status: Married    Spouse name: Not on file  . Number of children: Not on file  . Years of education: Not on file  . Highest education level: Not on file  Occupational History    Comment: [employer]  Tobacco Use  . Smoking status: Former Smoker    Packs/day: 0.50    Years: 12.00    Pack years: 6.00    Quit date: 02/06/1993    Years since quitting: 27.8  . Smokeless tobacco: Never Used  Vaping Use  . Vaping Use: Never used  Substance and Sexual Activity  . Alcohol use: No  . Drug use: No  . Sexual activity: Yes  Other Topics Concern  . 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 COLONOSCOPY    . Jaw surgery  2001  . OOPHORECTOMY Bilateral   . POLYPECTOMY    . VAGINAL HYSTERECTOMY  2009   vaginal hysterectomy    Family History  Problem Relation Age of Onset  . Hypertension Mother   . Arthritis Mother   . Hyperlipidemia Mother   . Stroke Mother        1st age 58, then recurrent 73  . Atrial fibrillation Mother   . Epilepsy Mother   . Arthritis Father   . Breast cancer Sister   . Cancer Sister        Recurrent Breast CA  . Atrial fibrillation Brother   . Von Willebrand disease Daughter   . Other Daughter        POTS  . Factor V Leiden deficiency Daughter   . Breast cancer Other   . Drug abuse Maternal Aunt   . Diabetes Neg Hx   . Heart attack Neg Hx   . Colon cancer Neg Hx   . Esophageal cancer Neg Hx   . Rectal cancer Neg Hx   . Stomach cancer Neg Hx     Allergies  Allergen Reactions  . Penicillins     REACTION: swelling/dyspnea  . Statins Other (See Comments)    Myalgias on several statins    Current Outpatient Medications on File Prior to Visit  Medication Sig Dispense Refill  . amLODipine (NORVASC) 2.5 MG tablet Take 1 tablet (2.5 mg total) by mouth daily. 180 tablet 3  . ezetimibe (ZETIA) 10 MG tablet Take 1 tablet (10 mg total) by mouth daily. 90 tablet 3  . losartan (COZAAR) 100 MG tablet Take 1 tablet (100 mg total) by mouth daily. 90 tablet 3  . SYNTHROID 75 MCG tablet Take 1 tablet (75 mcg total) by mouth daily before breakfast. 90 tablet 3   No current facility-administered medications on file prior to visit.    BP (!) 139/58 (BP Location: Right Arm, Patient Position: Sitting, Cuff Size: Small)   Pulse 90   Temp 97.7 F (36.5 C) (Oral)   Resp 16   Ht 5\' 4"  (1.626 m)   Wt 148 lb (67.1 kg)   SpO2 99%   BMI 25.40 kg/m       Objective:   Physical Exam Constitutional:      Appearance: She is well-developed.  Cardiovascular:     Rate and Rhythm: Normal  rate and regular rhythm.     Heart sounds: Normal heart sounds. No murmur heard.   Pulmonary:     Effort: Pulmonary effort is normal. No respiratory distress.     Breath sounds: Normal breath sounds. No wheezing.  Musculoskeletal:     Thoracic back: Normal. No swelling or tenderness.     Lumbar back: Normal. No swelling or tenderness.  Psychiatric:        Behavior: Behavior normal.        Thought Content: Thought content normal.        Judgment: Judgment normal.           Assessment & Plan:  Thoracic back pain- new. Will obtain x-ray of thoracic spine to rule out compression fracture.  Start meloxicam 7.5mg  once daily for pain. She is advised to call if new/worsening symptoms or if symptoms are not improved in 2 weeks.   This visit occurred during the SARS-CoV-2 public health emergency.  Safety protocols were in place, including screening questions prior to the visit, additional usage of staff PPE, and extensive cleaning of exam room while observing appropriate contact time as indicated for disinfecting solutions.

## 2020-12-09 NOTE — Patient Instructions (Signed)
Please complete x-ray at your convenience. Start meloxicam (anti-inflammatory) once daily.  Call if new/worsening symptoms or if symptoms are not improved in 2 weeks.

## 2020-12-12 ENCOUNTER — Other Ambulatory Visit: Payer: Self-pay

## 2020-12-12 ENCOUNTER — Ambulatory Visit (INDEPENDENT_AMBULATORY_CARE_PROVIDER_SITE_OTHER): Payer: Medicare Other

## 2020-12-12 ENCOUNTER — Telehealth: Payer: Self-pay | Admitting: Family Medicine

## 2020-12-12 DIAGNOSIS — E039 Hypothyroidism, unspecified: Secondary | ICD-10-CM

## 2020-12-12 DIAGNOSIS — M546 Pain in thoracic spine: Secondary | ICD-10-CM

## 2020-12-12 MED ORDER — SYNTHROID 75 MCG PO TABS: 75.0000 ug | ORAL_TABLET | Freq: Every day | ORAL | 3 refills | Status: AC

## 2020-12-12 NOTE — Telephone Encounter (Signed)
Patient states she has to take the brand the generic does not work for her   Medication: SYNTHROID 75 MCG tablet [[id number]]    Has the patient contacted their pharmacy? no (If no, request that the patient contact the pharmacy for the refill.) (If yes, when and what did the pharmacy advise?)    Preferred Pharmacy (with phone number or street name):  [id number] - [place], [place] - [address] Phone:  [phone]  Fax:  [phone]        Agent: Please be advised that RX refills may take up to 3 business days. We ask that you follow-up with your pharmacy.

## 2020-12-12 NOTE — Telephone Encounter (Signed)
Medication resent in

## 2020-12-16 ENCOUNTER — Ambulatory Visit (HOSPITAL_COMMUNITY)
Admission: RE | Admit: 2020-12-16 | Discharge: 2020-12-16 | Disposition: A | Discharge status: Home / Self Care | Payer: Medicare Other | Source: Ambulatory Visit | Attending: Vascular Surgery | Admitting: Vascular Surgery

## 2020-12-16 ENCOUNTER — Other Ambulatory Visit: Payer: Self-pay

## 2020-12-16 ENCOUNTER — Ambulatory Visit (INDEPENDENT_AMBULATORY_CARE_PROVIDER_SITE_OTHER): Payer: Medicare Other | Admitting: Physician Assistant

## 2020-12-16 VITALS — BP 131/59 | HR 68 | Temp 98.2°F | Resp 20 | Ht 64.0 in | Wt 149.2 lb

## 2020-12-16 DIAGNOSIS — I6523 Occlusion and stenosis of bilateral carotid arteries: Secondary | ICD-10-CM

## 2020-12-16 DIAGNOSIS — E785 Hyperlipidemia, unspecified: Secondary | ICD-10-CM

## 2020-12-16 NOTE — Progress Notes (Signed)
Established Carotid Patient   History of Present Illness   Lindsay Hodges is a 65 y.o. ([birth year]) female who presents to go over studies related to carotid artery stenosis.  She denies any history of CVA or TIA.  Since last office visit 1 year ago she denies any strokelike symptoms including slurring speech, changes in vision, or one-sided weakness.  She is in a research study with her cardiologist for an injectable cholesterol medication.  She is allergic to statins.  She used to take a baby aspirin daily however stopped doing this.  She denies tobacco use.  She has cramping in her back and left leg and believes this may be due to the research drug.  The patient's PMH, PSH, SH, and FamHx were reviewed and are unchanged from prior visit.  Current Outpatient Medications  Medication Sig Dispense Refill  . amLODipine (NORVASC) 2.5 MG tablet Take 1 tablet (2.5 mg total) by mouth daily. 180 tablet 3  . ezetimibe (ZETIA) 10 MG tablet Take 1 tablet (10 mg total) by mouth daily. 90 tablet 3  . losartan (COZAAR) 100 MG tablet Take 1 tablet (100 mg total) by mouth daily. 90 tablet 3  . SYNTHROID 75 MCG tablet Take 1 tablet (75 mcg total) by mouth daily before breakfast. 90 tablet 3   No current facility-administered medications for this visit.    REVIEW OF SYSTEMS (negative unless checked):   Cardiac:  []  Chest pain or chest pressure? []  Shortness of breath upon activity? []  Shortness of breath when lying flat? []  Irregular heart rhythm?  Vascular:  []  Pain in calf, thigh, or hip brought on by walking? []  Pain in feet at night that wakes you up from your sleep? []  Blood clot in your veins? []  Leg swelling?  Pulmonary:  []  Oxygen at home? []  Productive cough? []  Wheezing?  Neurologic:  []  Sudden weakness in arms or legs? []  Sudden numbness in arms or legs? []  Sudden onset of difficult speaking or slurred speech? []  Temporary loss of vision in one eye? []  Problems with  dizziness?  Gastrointestinal:  []  Blood in stool? []  Vomited blood?  Genitourinary:  []  Burning when urinating? []  Blood in urine?  Psychiatric:  []  Major depression  Hematologic:  []  Bleeding problems? []  Problems with blood clotting?  Dermatologic:  []  Rashes or ulcers?  Constitutional:  []  Fever or chills?  Ear/Nose/Throat:  []  Change in hearing? []  Nose bleeds? []  Sore throat?  Musculoskeletal:  []  Back pain? []  Joint pain? []  Muscle pain?   Physical Examination   Vitals:   12/16/20 0927 12/16/20 0928  BP: 121/65 (!) 131/59  Pulse: 68   Resp: 20   Temp: 98.2 F (36.8 C)   TempSrc: Temporal   SpO2: 100%   Weight: 149 lb 3.2 oz (67.7 kg)   Height: 5\' 4"  (1.626 m)    Body mass index is 25.61 kg/m.  General:  WDWN in NAD; vital signs documented above Gait: Not observed HENT: WNL, normocephalic Pulmonary: normal non-labored breathing Cardiac: regular HR Abdomen: soft, NT, no masses Skin: without rashes Vascular Exam/Pulses:  Right Left  Radial 2+ (normal) 2+ (normal)  DP 2+ (normal) 1+ (weak)  PT 1+ (weak) 1+ (weak)   Extremities: without ischemic changes, without Gangrene , without cellulitis; without open wounds;  Musculoskeletal: no muscle wasting or atrophy  Neurologic: A&O X 3;  No focal weakness or paresthesias are detected Psychiatric:  The pt has Normal affect.  Non-Invasive Vascular Imaging  B Carotid Duplex :   R ICA stenosis:  1-39%  R VA:  patent and antegrade  L ICA stenosis:  40-59%  L VA:  patent and antegrade   Medical Decision Making   Lindsay Hodges is a 65 y.o. female who presents for surveillance of carotid artery stenosis   Based on physical exam and history patient is not experiencing any strokelike symptoms  Carotid duplex is unchanged over the past year with right ICA stenosis of 1 to 39% and left ICA stenosis of 40 to 59%  Recommend 81 mg aspirin daily  Recheck carotid duplex in 1 year   Dagoberto Ligas PA-C [facility] Office: [phone]  Clinic MD: Donzetta Matters

## 2021-01-19 ENCOUNTER — Telehealth: Payer: Self-pay | Admitting: *Deleted

## 2021-01-19 NOTE — Telephone Encounter (Signed)
This is a new pilot to identify and manage incidental findings. What they saw were some plaques in her abdominal aorta or atherosclerosis. Usually does not amount to anything. Although if we have worried her we could do an ultrasound of her aorta confirm it is not worse than we think.

## 2021-01-19 NOTE — Telephone Encounter (Signed)
FYI Patient called and stated that she received a letter about her xray report from 12/12/20 which stated as below:  " Dear Ms. Lindsay Hodges:  The final report of your recent imaging, DG Thoracic Spine 2 View at [facility], completed on 12/12/2020 included the identification of a clinically significant incidental finding. An incidental finding is something extra found by the test and is not related to the reason your doctor ordered the test.  [facility] encourages you to call your primary care physician to find out if there are any concerns specific to the finding and/or if any follow up is needed.   If you currently do not have a primary care physician or need some assistance, please call the [facility] at [phone]."   I advised patient that Melissa reviewed her xray report and results were given.  She will call the patient engagement center.  I am not sure why they would send this to the patient.

## 2021-01-20 NOTE — Telephone Encounter (Signed)
Pt is aware.  

## 2021-01-23 ENCOUNTER — Other Ambulatory Visit: Payer: Self-pay

## 2021-01-23 DIAGNOSIS — Z006 Encounter for examination for normal comparison and control in clinical research program: Secondary | ICD-10-CM

## 2021-01-23 NOTE — Research (Signed)
[facility] Week 48 Visit   Patient Name Lindsay Hodges Subject ID# [id number]  Visit Date 01/23/21  Non-Fatal Potential Endpoint Assessment Yes  No   Has the subject experienced/undergone any of the following since the last visit/contact?  []  [x]   Any Coronary Artery Revascularization/Cerebrovascular Revascularization/ Peripheral Artery Revascularization/Amputation Procedure  []  [x]   Myocardial Infarction []  [x]   Stroke  []  X  Provide the date for the non-fatal Potential Endpoints status:  []  []     Treatment compliance: Date returned: 01/23/21 Number returned from box#: 4 Number returned from box# 4: Number returned-total: 8 Number of Full administrations: 8  Number of partial administrations: 0 Number of non administrations: 0 Number of unknown administrations:0  Reason for any partial or non administrations: n/a Number due to non compliance: Number due to device complaint: Number due to other:  IP Dispensation: Date dispensed 01/23/21 Number dispensed- total: 8 Number dispensed home to patient: 8 Box # [id number] Box # [id number]  IP Administration in Clinic: Box # [id number] Administration time 2820 [x] AM [] PM in clinic Quantity Administered    [] None [] Partial [x] Full Reason for None or Partial Dose ____ Administration Site [x] Abdomen [] Arm [] Thigh Laterality [] Left  [x] Right Administered by [x] Patient [] Caregiver  IP administered and returned from box dispensed at week 32 visit.

## 2021-05-01 ENCOUNTER — Telehealth: Payer: Self-pay | Admitting: *Deleted

## 2021-05-01 DIAGNOSIS — M546 Pain in thoracic spine: Secondary | ICD-10-CM | POA: Diagnosis not present

## 2021-05-01 DIAGNOSIS — R1084 Generalized abdominal pain: Secondary | ICD-10-CM | POA: Diagnosis not present

## 2021-05-01 NOTE — Telephone Encounter (Signed)
Patient went to urgent care

## 2021-05-02 DIAGNOSIS — Z0131 Encounter for examination of blood pressure with abnormal findings: Secondary | ICD-10-CM | POA: Diagnosis not present

## 2021-05-02 DIAGNOSIS — I10 Essential (primary) hypertension: Secondary | ICD-10-CM | POA: Diagnosis not present

## 2021-05-02 DIAGNOSIS — Z03818 Encounter for observation for suspected exposure to other biological agents ruled out: Secondary | ICD-10-CM | POA: Diagnosis not present

## 2021-05-02 DIAGNOSIS — Z20822 Contact with and (suspected) exposure to covid-19: Secondary | ICD-10-CM | POA: Diagnosis not present

## 2021-05-18 ENCOUNTER — Ambulatory Visit (INDEPENDENT_AMBULATORY_CARE_PROVIDER_SITE_OTHER): Payer: Medicare Other | Admitting: Family Medicine

## 2021-05-18 ENCOUNTER — Other Ambulatory Visit: Payer: Self-pay

## 2021-05-18 ENCOUNTER — Other Ambulatory Visit: Payer: Self-pay | Admitting: Family Medicine

## 2021-05-18 ENCOUNTER — Encounter: Payer: Self-pay | Admitting: Family Medicine

## 2021-05-18 VITALS — BP 138/74 | HR 76 | Temp 97.7°F | Resp 98 | Wt 153.4 lb

## 2021-05-18 DIAGNOSIS — E785 Hyperlipidemia, unspecified: Secondary | ICD-10-CM

## 2021-05-18 DIAGNOSIS — E039 Hypothyroidism, unspecified: Secondary | ICD-10-CM | POA: Diagnosis not present

## 2021-05-18 DIAGNOSIS — E538 Deficiency of other specified B group vitamins: Secondary | ICD-10-CM

## 2021-05-18 DIAGNOSIS — R748 Abnormal levels of other serum enzymes: Secondary | ICD-10-CM

## 2021-05-18 DIAGNOSIS — M546 Pain in thoracic spine: Secondary | ICD-10-CM | POA: Diagnosis not present

## 2021-05-18 DIAGNOSIS — R739 Hyperglycemia, unspecified: Secondary | ICD-10-CM | POA: Diagnosis not present

## 2021-05-18 DIAGNOSIS — I10 Essential (primary) hypertension: Secondary | ICD-10-CM

## 2021-05-18 DIAGNOSIS — G8929 Other chronic pain: Secondary | ICD-10-CM

## 2021-05-18 DIAGNOSIS — K824 Cholesterolosis of gallbladder: Secondary | ICD-10-CM

## 2021-05-18 DIAGNOSIS — I6523 Occlusion and stenosis of bilateral carotid arteries: Secondary | ICD-10-CM

## 2021-05-18 DIAGNOSIS — E559 Vitamin D deficiency, unspecified: Secondary | ICD-10-CM | POA: Diagnosis not present

## 2021-05-18 LAB — LIPID PANEL
Cholesterol: 147 mg/dL (ref 0–200)
HDL: 68.1 mg/dL (ref >39.00)
LDL Cholesterol: 62 mg/dL (ref 0–99)
NonHDL: 78.68
Total CHOL/HDL Ratio: 2
Triglycerides: 81 mg/dL (ref 0.0–149.0)
VLDL: 16.2 mg/dL (ref 0.0–40.0)

## 2021-05-18 LAB — COMPREHENSIVE METABOLIC PANEL
ALT: 105 U/L — ABNORMAL HIGH (ref 0–35)
AST: 83 U/L — ABNORMAL HIGH (ref 0–37)
Albumin: 4 g/dL (ref 3.5–5.2)
Alkaline Phosphatase: 90 U/L (ref 39–117)
BUN: 18 mg/dL (ref 6–23)
CO2: 28 mEq/L (ref 19–32)
Calcium: 9.2 mg/dL (ref 8.4–10.5)
Chloride: 105 mEq/L (ref 96–112)
Creatinine, Ser: 0.77 mg/dL (ref 0.40–1.20)
GFR: 79.84 mL/min (ref >60.00)
Glucose, Bld: 79 mg/dL (ref 70–99)
Potassium: 4.6 mEq/L (ref 3.5–5.1)
Sodium: 140 mEq/L (ref 135–145)
Total Bilirubin: 0.5 mg/dL (ref 0.2–1.2)
Total Protein: 7.3 g/dL (ref 6.0–8.3)

## 2021-05-18 LAB — CBC WITH DIFFERENTIAL/PLATELET
Basophils Absolute: 0 10*3/uL (ref 0.0–0.1)
Basophils Relative: 0.6 % (ref 0.0–3.0)
Eosinophils Absolute: 0.3 10*3/uL (ref 0.0–0.7)
Eosinophils Relative: 4 % (ref 0.0–5.0)
HCT: 37.5 % (ref 36.0–46.0)
Hemoglobin: 12.1 g/dL (ref 12.0–15.0)
Lymphocytes Relative: 26.7 % (ref 12.0–46.0)
Lymphs Abs: 1.7 10*3/uL (ref 0.7–4.0)
MCHC: 32.4 g/dL (ref 30.0–36.0)
MCV: 87.5 fl (ref 78.0–100.0)
Monocytes Absolute: 0.7 10*3/uL (ref 0.1–1.0)
Monocytes Relative: 10.5 % (ref 3.0–12.0)
Neutro Abs: 3.7 10*3/uL (ref 1.4–7.7)
Neutrophils Relative %: 58.2 % (ref 43.0–77.0)
Platelets: 203 10*3/uL (ref 150.0–400.0)
RBC: 4.29 Mil/uL (ref 3.87–5.11)
RDW: 14.5 % (ref 11.5–15.5)
WBC: 6.4 10*3/uL (ref 4.0–10.5)

## 2021-05-18 LAB — HEMOGLOBIN A1C: Hgb A1c MFr Bld: 6 % (ref 4.6–6.5)

## 2021-05-18 LAB — VITAMIN D 25 HYDROXY (VIT D DEFICIENCY, FRACTURES): VITD: 29.87 ng/mL — ABNORMAL LOW (ref 30.00–100.00)

## 2021-05-18 LAB — TSH: TSH: 1.68 u[IU]/mL (ref 0.35–5.50)

## 2021-05-18 NOTE — Patient Instructions (Signed)
  Paxlovid is the new COVID medication we can give you if you get COVID so make sure you test if you have symptoms because we have to treat by day 5 of symptoms for it to be effective. If you are positive let us know so we can treat. If a home test is negative and your symptoms are persistent get a PCR test. Can check testing locations at [email or web address] If you are positive we will make an appointment with [place] and we will send in Paxlovid if you would like it. Check with your pharmacy before we meet to confirm they have it in stock, if they do not then we can get the prescription at the [facility]

## 2021-05-18 NOTE — Progress Notes (Signed)
Patient ID: Lindsay Hodges, female    DOB: [birth year]  Age: 65 y.o. MRN: [id number]    Subjective:  Subjective  HPI [name] presents for office visit today for follow up on htn and recent hospitalization. She reports experiencing back pain for 3 days before going to urgent care. She was prescribed tizanidine 4 mg for the back pain. She states that she experiences side effects of sleepiness, therefore she splits her dosage in half. Endorses having residual back pain remaining. Denies CP/palp/SOB/HA/congestion/fevers/GI or GU c/o. Taking meds as prescribed. Endorses taking vitamin D supplements of 2000 IU.  Review of Systems  Constitutional:  Negative for chills, fatigue and fever.  HENT:  Negative for congestion, rhinorrhea, sinus pressure, sinus pain, sore throat and trouble swallowing.   Eyes:  Negative for pain.  Respiratory:  Negative for cough and shortness of breath.   Cardiovascular:  Negative for chest pain, palpitations and leg swelling.  Gastrointestinal:  Negative for abdominal pain, blood in stool, diarrhea, nausea and vomiting.  Genitourinary:  Negative for decreased urine volume, flank pain, frequency, vaginal bleeding and vaginal discharge.  Musculoskeletal:  Positive for back pain.  Neurological:  Negative for headaches.   History Past Medical History:  Diagnosis Date   Arthritis    Bursitis of hip 08/02/15   Carotid disease, bilateral ([place])    [place] 10/15: R 40-59%, L 50-69%, stable for years   Elevated sed rate    remote history of myelosuppressive disorder   Grave's disease    s/p I-131 ablation 1990s   Hyperlipidemia    Hypertension    HYPOTHYROIDISM    Left knee pain 03/26/2014   Personal history of colonic adenoma 11/30/2002   Preventative health care 09/04/2015   Thrombophlebitis 10/16/2016   Vitamin D deficiency     She has a past surgical history that includes Jaw surgery (2001); Oophorectomy (Bilateral); Vaginal hysterectomy (2009); Colonoscopy; and  Polypectomy.   Her family history includes Arthritis in her father and mother; Atrial fibrillation in her brother and mother; Breast cancer in her sister and another family member; Cancer in her sister; Drug abuse in her maternal aunt; Epilepsy in her mother; Factor V Leiden deficiency in her daughter; Hyperlipidemia in her mother; Hypertension in her mother; Other in her daughter; Stroke in her mother; Von Willebrand disease in her daughter.She reports that she quit smoking about 28 years ago. Her smoking use included cigarettes. She has a 6.00 pack-year smoking history. She has never used smokeless tobacco. She reports that she does not drink alcohol and does not use drugs.  Current Outpatient Medications on File Prior to Visit  Medication Sig Dispense Refill   ezetimibe (ZETIA) 10 MG tablet Take 1 tablet (10 mg total) by mouth daily. 90 tablet 3   losartan (COZAAR) 100 MG tablet Take 1 tablet (100 mg total) by mouth daily. 90 tablet 3   SYNTHROID 75 MCG tablet Take 1 tablet (75 mcg total) by mouth daily before breakfast. 90 tablet 3   tiZANidine (ZANAFLEX) 4 MG tablet Take 1 tablet (4 mg total) by mouth every 6 (six) hours as needed for muscle spasms. 30 tablet 0   amLODipine (NORVASC) 2.5 MG tablet Take 1 tablet (2.5 mg total) by mouth daily. 180 tablet 3   No current facility-administered medications on file prior to visit.     Objective:  Objective  Physical Exam Constitutional:      General: She is not in acute distress.    Appearance: Normal appearance. She  is not ill-appearing or toxic-appearing.  HENT:     Head: Normocephalic and atraumatic.     Right Ear: Tympanic membrane, ear canal and external ear normal.     Left Ear: Tympanic membrane, ear canal and external ear normal.     Nose: No congestion or rhinorrhea.  Eyes:     Extraocular Movements: Extraocular movements intact.     Pupils: Pupils are equal, round, and reactive to light.  Cardiovascular:     Rate and Rhythm:  Normal rate and regular rhythm.     Pulses: Normal pulses.     Heart sounds: Normal heart sounds. No murmur heard. Pulmonary:     Effort: Pulmonary effort is normal. No respiratory distress.     Breath sounds: Normal breath sounds. No wheezing, rhonchi or rales.  Abdominal:     General: Bowel sounds are normal.     Palpations: Abdomen is soft. There is no mass.     Tenderness: no abdominal tenderness There is no guarding.     Hernia: No hernia is present.  Musculoskeletal:        General: Normal range of motion.     Cervical back: Normal range of motion and neck supple.  Skin:    General: Skin is warm and dry.  Neurological:     Mental Status: She is alert and oriented to person, place, and time.  Psychiatric:        Behavior: Behavior normal.   BP 138/74   Pulse 76   Temp 97.7 F (36.5 C)   Resp (!) 98   Wt 153 lb 6.4 oz (69.6 kg)   HC 16" (40.6 cm)   BMI 26.33 kg/m  Wt Readings from Last 3 Encounters:  05/18/21 153 lb 6.4 oz (69.6 kg)  12/16/20 149 lb 3.2 oz (67.7 kg)  12/09/20 148 lb (67.1 kg)     Lab Results  Component Value Date   WBC 6.4 05/18/2021   HGB 12.1 05/18/2021   HCT 37.5 05/18/2021   PLT 203.0 05/18/2021   GLUCOSE 79 05/18/2021   CHOL 147 05/18/2021   TRIG 81.0 05/18/2021   HDL 68.10 05/18/2021   LDLCALC 62 05/18/2021   ALT 105 (H) 05/18/2021   AST 83 (H) 05/18/2021   NA 140 05/18/2021   K 4.6 05/18/2021   CL 105 05/18/2021   CREATININE 0.77 05/18/2021   BUN 18 05/18/2021   CO2 28 05/18/2021   TSH 1.68 05/18/2021   HGBA1C 6.0 05/18/2021    VAS US CAROTID  Result Date: 12/16/2020 Carotid Arterial Duplex Study Indications:       Carotid artery disease. Risk Factors:      Hypertension, hyperlipidemia. Comparison Study:  12/17/19 Performing Technologist: June Leap RDMS, RVT  Examination Guidelines: A complete evaluation includes B-mode imaging, spectral Doppler, color Doppler, and power Doppler as needed of all accessible portions of each  vessel. Bilateral testing is considered an integral part of a complete examination. Limited examinations for reoccurring indications may be performed as noted.  Right Carotid Findings: +----------+--------+--------+--------+------------------+--------+           PSV cm/sEDV cm/sStenosisPlaque DescriptionComments +----------+--------+--------+--------+------------------+--------+ CCA Prox  57      7                                          +----------+--------+--------+--------+------------------+--------+ CCA Distal93      19                                         +----------+--------+--------+--------+------------------+--------+  ICA Prox  70      17      1-39%   heterogenous               +----------+--------+--------+--------+------------------+--------+ ICA Mid   99      29                                         +----------+--------+--------+--------+------------------+--------+ ICA Distal96      25                                         +----------+--------+--------+--------+------------------+--------+ ECA       81      11              heterogenous               +----------+--------+--------+--------+------------------+--------+ +----------+--------+-------+----------------+-------------------+           PSV cm/sEDV cmsDescribe        Arm Pressure (mmHG) +----------+--------+-------+----------------+-------------------+ [id number]            Multiphasic, WNL                    +----------+--------+-------+----------------+-------------------+ +---------+--------+--+--------+--+---------+ VertebralPSV cm/s44EDV cm/s14Antegrade +---------+--------+--+--------+--+---------+  Left Carotid Findings: +----------+--------+--------+--------+------------------+--------+           PSV cm/sEDV cm/sStenosisPlaque DescriptionComments +----------+--------+--------+--------+------------------+--------+ CCA Prox  65      20                                          +----------+--------+--------+--------+------------------+--------+ CCA Distal61      19                                         +----------+--------+--------+--------+------------------+--------+ ICA Prox  251     47      40-59%  heterogenous               +----------+--------+--------+--------+------------------+--------+ ICA Mid   137     29                                         +----------+--------+--------+--------+------------------+--------+ ICA Distal114     26                                         +----------+--------+--------+--------+------------------+--------+ ECA       120     9               heterogenous               +----------+--------+--------+--------+------------------+--------+ +----------+--------+--------+----------------+-------------------+           PSV cm/sEDV cm/sDescribe        Arm Pressure (mmHG) +----------+--------+--------+----------------+-------------------+ [id number]             Multiphasic, WNL                    +----------+--------+--------+----------------+-------------------+ +---------+--------+--+--------+--+---------+ VertebralPSV cm/s69EDV  cm/s25Antegrade +---------+--------+--+--------+--+---------+   Summary: Right Carotid: Velocities in the right ICA are consistent with a 1-39% stenosis. Left Carotid: Velocities in the left ICA are consistent with a 40-59% stenosis.  *See table(s) above for measurements and observations.  Electronically signed by Servando Snare MD on 12/16/2020 at 9:41:57 AM.    Final      Assessment & Plan:  Plan    No orders of the defined types were placed in this encounter.   Problem List Items Addressed This Visit     Hypothyroidism - Primary    On Levothyroxine, continue to monitor      Hyperlipidemia    Encourage heart healthy diet such as MIND or DASH diet, increase exercise, avoid trans fats, simple carbohydrates and processed foods, consider a  krill or fish or flaxseed oil cap daily. Tolerating Zetia      Relevant Orders   Lipid panel (Completed)   Hypertension   Relevant Orders   CBC w/Diff (Completed)   Comprehensive metabolic panel (Completed)   TSH (Completed)   Vitamin D deficiency    Supplement and monitor      Relevant Orders   VITAMIN D 25 Hydroxy (Vit-D Deficiency, Fractures) (Completed)   Hyperglycemia    hgba1c acceptable, minimize simple carbs. Increase exercise as tolerated.       Relevant Orders   Hemoglobin A1c (Completed)   Thoracic back pain    Right sided, intermittent, no falls or trauma. Encouraged moist heat and gentle stretching as tolerated. May try NSAIDs and prescription meds as directed and report if symptoms worsen or seek immediate care      Relevant Medications   tiZANidine (ZANAFLEX) 4 MG tablet   Low serum vitamin B12    Supplement and monitor       Follow-up: Return in about 6 months (around 11/18/2021) for annual exam.  I, Suezanne Jacquet, acting as a scribe for Penni Homans, MD, have documented all relevent documentation on behalf of Penni Homans, MD, as directed by Penni Homans, MD while in the presence of Penni Homans, MD.  I, Mosie Lukes, MD personally performed the services described in this documentation. All medical record entries made by the scribe were at my direction and in my presence. I have reviewed the chart and agree that the record reflects my personal performance and is accurate and complete

## 2021-05-21 NOTE — Assessment & Plan Note (Signed)
Supplement and monitor 

## 2021-05-21 NOTE — Assessment & Plan Note (Signed)
Encourage heart healthy diet such as MIND or DASH diet, increase exercise, avoid trans fats, simple carbohydrates and processed foods, consider a krill or fish or flaxseed oil cap daily.  Tolerating Zetia 

## 2021-05-21 NOTE — Assessment & Plan Note (Signed)
On Levothyroxine, continue to monitor 

## 2021-05-21 NOTE — Assessment & Plan Note (Signed)
hgba1c acceptable, minimize simple carbs. Increase exercise as tolerated.  

## 2021-05-21 NOTE — Assessment & Plan Note (Signed)
Right sided, intermittent, no falls or trauma. Encouraged moist heat and gentle stretching as tolerated. May try NSAIDs and prescription meds as directed and report if symptoms worsen or seek immediate care

## 2021-05-22 ENCOUNTER — Ambulatory Visit (INDEPENDENT_AMBULATORY_CARE_PROVIDER_SITE_OTHER): Payer: Medicare Other

## 2021-05-22 ENCOUNTER — Other Ambulatory Visit: Payer: Self-pay

## 2021-05-22 ENCOUNTER — Other Ambulatory Visit: Payer: Self-pay | Admitting: Family Medicine

## 2021-05-22 DIAGNOSIS — R748 Abnormal levels of other serum enzymes: Secondary | ICD-10-CM

## 2021-05-22 DIAGNOSIS — I714 Abdominal aortic aneurysm, without rupture, unspecified: Secondary | ICD-10-CM

## 2021-05-22 DIAGNOSIS — R945 Abnormal results of liver function studies: Secondary | ICD-10-CM

## 2021-05-22 DIAGNOSIS — K824 Cholesterolosis of gallbladder: Secondary | ICD-10-CM

## 2021-05-22 DIAGNOSIS — R7989 Other specified abnormal findings of blood chemistry: Secondary | ICD-10-CM

## 2021-05-23 DIAGNOSIS — Z006 Encounter for examination for normal comparison and control in clinical research program: Secondary | ICD-10-CM

## 2021-05-23 NOTE — Research (Signed)
Patient seen in the clinic today and was concerned about needing her gallbladder removed and an abdominal aortic aneurysm that was found this week. Dr Debara Pickett was in the clinic and was able to speak to the patient. Her ALT is elevated but she has no symptoms and MD feels like it is related to her gallstones. After speaking with Dr Debara Pickett patient is still hesistant about taking the IP medication and wishes to hold treatment until she meets with the surgeon then she will make a final decision if she wishes to continue with the study    [facility] Week 64 Visit   Patient Name Lindsay Hodges Subject ID# [id number]  Visit Date 05/23/21  Non-Fatal Potential Endpoint Assessment Yes  No   Has the subject experienced/undergone any of the following since the last visit/contact?  '[]'$  '[x]'$   Any Coronary Artery Revascularization/Cerebrovascular Revascularization/ Peripheral Artery Revascularization/Amputation Procedure  '[]'$  '[x]'$   Myocardial Infarction '[]'$  '[x]'$   Stroke  '[]'$    Provide the date for the non-fatal Potential Endpoints status:  '[]'$  '[x]'$     Treatment compliance: Date returned: 05/23/21 Number returned from box# __ [id number]: Number returned from box# ____[id number]_: Number returned-total: 8 Number of Full administrations:1  Number of partial administrations:0  Number of non administrations:1 Number of unknown administrations:0  Reason for any partial or non administrations:Patient wishes to hold treatment at this time Number due to non compliance:0  Number due to device complaint:0 Number due to other:see above  IP Dispensation: Date dispensed 05/23/21 Number dispensed- total: 0  Number dispensed home to patient: 0 Box # _____ Box # _____  IP Administration in Clinic: not given at patient request Box # ____ Administration time ____ '[]'$ AM '[]'$ PM in clinic Quantity Administered    '[]'$ None '[]'$ Partial '[]'$ Full Reason for None or Partial Dose ____ Administration Site '[]'$ Abdomen '[]'$ Arm  '[]'$ Thigh Laterality '[]'$ Left  '[]'$ Right Administered by '[]'$ Patient '[]'$ Caregiver

## 2021-05-24 ENCOUNTER — Encounter: Payer: Self-pay | Admitting: Family Medicine

## 2021-05-24 ENCOUNTER — Telehealth: Payer: Self-pay | Admitting: Family Medicine

## 2021-05-24 NOTE — Telephone Encounter (Signed)
Hi Lindsay Hodges, I called patient to schedule her annual wellness visit.Patient said she was charged over $300 for a physical on 11/15/20. Patient said she wasn't aware that she was suppose to have a wellness visit instead of a physical. Plus, she was charged because they discussed something outside of a physical.  Patient paid the bill.  Can anything be done for her?

## 2021-05-25 NOTE — Telephone Encounter (Signed)
I called patient and let her know what Dawn said. She was very grateful.

## 2021-05-25 NOTE — Telephone Encounter (Signed)
Hi Lindsay Hodges, Thank you! I explained the AWV to patient.  She scheduled her AWV w/ Lindsay Hodges on 06/13/21.

## 2021-05-31 ENCOUNTER — Encounter: Payer: Self-pay | Admitting: Vascular Surgery

## 2021-05-31 ENCOUNTER — Other Ambulatory Visit: Payer: Self-pay

## 2021-05-31 ENCOUNTER — Ambulatory Visit (INDEPENDENT_AMBULATORY_CARE_PROVIDER_SITE_OTHER): Payer: Medicare Other | Admitting: Vascular Surgery

## 2021-05-31 VITALS — BP 136/79 | HR 74 | Temp 98.0°F | Resp 20 | Ht 64.0 in | Wt 151.0 lb

## 2021-05-31 DIAGNOSIS — I6523 Occlusion and stenosis of bilateral carotid arteries: Secondary | ICD-10-CM

## 2021-05-31 DIAGNOSIS — I714 Abdominal aortic aneurysm, without rupture, unspecified: Secondary | ICD-10-CM

## 2021-05-31 DIAGNOSIS — Z23 Encounter for immunization: Secondary | ICD-10-CM | POA: Diagnosis not present

## 2021-05-31 NOTE — Progress Notes (Signed)
Patient ID: Lindsay Hodges, female   DOB: 1955-12-18, 65 y.o.   MRN: [id number]  Reason for Consult: Follow-up   Referred by Mosie Lukes, MD  Subjective:     HPI:  Lindsay Hodges is a 65 y.o. female followed in this office for asymptomatic bilateral carotid artery stenosis.  She did not know of any previous history of aneurysm disease in either her or her family.  She is a former smoker quit many years ago.  She has gallbladder disease underwent abdominal ultrasound was found to have abdominal aortic aneurysm.  She has no new back or abdominal pain.  She walks without limitation.  She is on Zetia with an allergy to statins.  Past Medical History:  Diagnosis Date   AAA (abdominal aortic aneurysm) ([place])    Arthritis    Bursitis of hip 08/02/2015   Carotid disease, bilateral ([place])    [place] 10/15: R 40-59%, L 50-69%, stable for years   Elevated sed rate    remote history of myelosuppressive disorder   Grave's disease    s/p I-131 ablation 1990s   Hyperlipidemia    Hypertension    HYPOTHYROIDISM    Left knee pain 03/26/2014   Personal history of colonic adenoma 11/30/2002   Preventative health care 09/04/2015   Thrombophlebitis 10/16/2016   Vitamin D deficiency    Family History  Problem Relation Age of Onset   Hypertension Mother    Arthritis Mother    Hyperlipidemia Mother    Stroke Mother        1st age 44, then recurrent 75   Atrial fibrillation Mother    Epilepsy Mother    Arthritis Father    Breast cancer Sister    Cancer Sister        Recurrent Breast CA   Atrial fibrillation Brother    Von Willebrand disease Daughter    Other Daughter        POTS   Factor V Leiden deficiency Daughter    Breast cancer Other    Drug abuse Maternal Aunt    Diabetes Neg Hx    Heart attack Neg Hx    Colon cancer Neg Hx    Esophageal cancer Neg Hx    Rectal cancer Neg Hx    Stomach cancer Neg Hx    Past Surgical History:  Procedure Laterality Date   COLONOSCOPY      Jaw surgery  2001   OOPHORECTOMY Bilateral    POLYPECTOMY     VAGINAL HYSTERECTOMY  2009   vaginal hysterectomy    Short Social History:  Social History   Tobacco Use   Smoking status: Former    Packs/day: 0.50    Years: 12.00    Pack years: 6.00    Types: Cigarettes    Quit date: 02/06/1993    Years since quitting: 28.3   Smokeless tobacco: Never  Substance Use Topics   Alcohol use: No    Allergies  Allergen Reactions   Penicillins     REACTION: swelling/dyspnea   Statins Other (See Comments)    Myalgias on several statins    Current Outpatient Medications  Medication Sig Dispense Refill   ezetimibe (ZETIA) 10 MG tablet Take 1 tablet (10 mg total) by mouth daily. 90 tablet 3   losartan (COZAAR) 100 MG tablet Take 1 tablet (100 mg total) by mouth daily. 90 tablet 3   SYNTHROID 75 MCG tablet Take 1 tablet (75 mcg total) by mouth daily before breakfast. 90  tablet 3   tiZANidine (ZANAFLEX) 4 MG tablet Take 1 tablet (4 mg total) by mouth every 6 (six) hours as needed for muscle spasms. 30 tablet 0   amLODipine (NORVASC) 2.5 MG tablet Take 1 tablet (2.5 mg total) by mouth daily. 180 tablet 3   No current facility-administered medications for this visit.    Review of Systems  Constitutional:  Constitutional negative. HENT: HENT negative.  Eyes: Eyes negative.  Respiratory: Respiratory negative.  Cardiovascular: Cardiovascular negative.  GI: Gastrointestinal negative.  Musculoskeletal: Musculoskeletal negative.  Skin: Skin negative.  Neurological: Neurological negative. Hematologic: Hematologic/lymphatic negative.  Psychiatric: Psychiatric negative.       Objective:  Objective   Physical Exam HENT:     Head: Normocephalic.     Nose:     Comments: Wearing a mask Eyes:     Pupils: Pupils are equal, round, and reactive to light.  Cardiovascular:     Rate and Rhythm: Normal rate.     Pulses: Normal pulses.  Pulmonary:     Effort: Pulmonary effort is normal.   Abdominal:     General: Abdomen is flat.     Palpations: Abdomen is soft. There is no mass.  Musculoskeletal:        General: Normal range of motion.     Cervical back: Normal range of motion and neck supple.     Right lower leg: No edema.     Left lower leg: No edema.  Skin:    General: Skin is warm.     Capillary Refill: Capillary refill takes less than 2 seconds.  Neurological:     General: No focal deficit present.     Mental Status: She is alert.  Psychiatric:        Mood and Affect: Mood normal.        Thought Content: Thought content normal.        Judgment: Judgment normal.    Data: CT IMPRESSION: 1. A 4 mm gallbladder polyp. 2. Artifact versus a possible small gallstone. No evidence of acute cholecystitis. 3. A 4 cm abdominal aortic aneurysm. Recommend follow-up every 12 months and vascular consultation. This recommendation follows ACR consensus guidelines: White Paper of the ACR Incidental Findings Committee II on Vascular Findings. J Am Coll Radiol 2013; 10:789-794.     Assessment/Plan:    65 year old female with known bilateral asymptomatic carotid stenosis.  She now has 4 cm abdominal aortic aneurysm found on study for gallbladder disease.  I discussed expected course of a 4 cm aneurysm as well as the need for follow-up in 1 year with repeat duplex.  She demonstrates very good understanding of our discussion today.  We will get her back in 1 year we will check her carotid arteries at that time as well.  We discussed needing CT scan when she is closer to 5 cm.     Waynetta Sandy MD [facility]

## 2021-06-02 ENCOUNTER — Telehealth: Payer: Self-pay | Admitting: Family Medicine

## 2021-06-02 NOTE — Telephone Encounter (Signed)
Pt. Called in and stated that she hasnt heard anything from the sur

## 2021-06-07 ENCOUNTER — Ambulatory Visit: Payer: Medicare Other | Admitting: Vascular Surgery

## 2021-06-11 ENCOUNTER — Encounter: Payer: Self-pay | Admitting: Family Medicine

## 2021-06-13 ENCOUNTER — Ambulatory Visit: Payer: Medicare Other

## 2021-06-15 DIAGNOSIS — K824 Cholesterolosis of gallbladder: Secondary | ICD-10-CM | POA: Diagnosis not present

## 2021-06-15 DIAGNOSIS — R7401 Elevation of levels of liver transaminase levels: Secondary | ICD-10-CM | POA: Insufficient documentation

## 2021-06-16 ENCOUNTER — Other Ambulatory Visit: Payer: Self-pay | Admitting: *Deleted

## 2021-06-16 DIAGNOSIS — R7989 Other specified abnormal findings of blood chemistry: Secondary | ICD-10-CM

## 2021-06-19 ENCOUNTER — Other Ambulatory Visit (INDEPENDENT_AMBULATORY_CARE_PROVIDER_SITE_OTHER): Payer: Medicare Other

## 2021-06-19 ENCOUNTER — Other Ambulatory Visit: Payer: Self-pay

## 2021-06-19 DIAGNOSIS — R7989 Other specified abnormal findings of blood chemistry: Secondary | ICD-10-CM

## 2021-06-19 DIAGNOSIS — E039 Hypothyroidism, unspecified: Secondary | ICD-10-CM | POA: Diagnosis not present

## 2021-06-19 DIAGNOSIS — I714 Abdominal aortic aneurysm, without rupture, unspecified: Secondary | ICD-10-CM | POA: Insufficient documentation

## 2021-06-19 DIAGNOSIS — E782 Mixed hyperlipidemia: Secondary | ICD-10-CM | POA: Diagnosis not present

## 2021-06-19 DIAGNOSIS — R748 Abnormal levels of other serum enzymes: Secondary | ICD-10-CM | POA: Diagnosis not present

## 2021-06-19 DIAGNOSIS — I10 Essential (primary) hypertension: Secondary | ICD-10-CM | POA: Diagnosis not present

## 2021-06-19 DIAGNOSIS — Z7689 Persons encountering health services in other specified circumstances: Secondary | ICD-10-CM | POA: Diagnosis not present

## 2021-06-19 LAB — COMPREHENSIVE METABOLIC PANEL
ALT: 91 U/L — ABNORMAL HIGH (ref 0–35)
AST: 64 U/L — ABNORMAL HIGH (ref 0–37)
Albumin: 4 g/dL (ref 3.5–5.2)
Alkaline Phosphatase: 79 U/L (ref 39–117)
BUN: 15 mg/dL (ref 6–23)
CO2: 28 mEq/L (ref 19–32)
Calcium: 9 mg/dL (ref 8.4–10.5)
Chloride: 105 mEq/L (ref 96–112)
Creatinine, Ser: 0.83 mg/dL (ref 0.40–1.20)
GFR: 72.92 mL/min (ref >60.00)
Glucose, Bld: 80 mg/dL (ref 70–99)
Potassium: 4.2 mEq/L (ref 3.5–5.1)
Sodium: 139 mEq/L (ref 135–145)
Total Bilirubin: 0.5 mg/dL (ref 0.2–1.2)
Total Protein: 7.2 g/dL (ref 6.0–8.3)

## 2021-06-20 ENCOUNTER — Ambulatory Visit: Payer: Medicare Other

## 2021-06-20 ENCOUNTER — Other Ambulatory Visit: Payer: Self-pay | Admitting: *Deleted

## 2021-06-20 ENCOUNTER — Other Ambulatory Visit: Payer: Self-pay | Admitting: Surgery

## 2021-06-20 DIAGNOSIS — K824 Cholesterolosis of gallbladder: Secondary | ICD-10-CM

## 2021-06-20 DIAGNOSIS — R7989 Other specified abnormal findings of blood chemistry: Secondary | ICD-10-CM

## 2021-06-20 NOTE — Progress Notes (Deleted)
Subjective:   Lindsay Hodges is a 65 y.o. female who presents for an Initial Medicare Annual Wellness Visit.  I connected with *** today by telephone and verified that I am speaking with the correct person using two identifiers. Location patient: home Location provider: work Persons participating in the virtual visit: patient, Marine scientist.    I discussed the limitations, risks, security and privacy concerns of performing an evaluation and management service by telephone and the availability of in person appointments. I also discussed with the patient that there may be a patient responsible charge related to this service. The patient expressed understanding and verbally consented to this telephonic visit.    Interactive audio and video telecommunications were attempted between this provider and patient, however failed, due to patient having technical difficulties OR patient did not have access to video capability.  We continued and completed visit with audio only.  Some vital signs may be absent or patient reported.   Time Spent with patient on telephone encounter: *** minutes   Review of Systems    ***       Objective:    There were no vitals filed for this visit. There is no height or weight on file to calculate BMI.  Advanced Directives 12/17/2019 12/12/2018 11/30/2016 10/16/2016 01/18/2016 09/01/2015 04/14/2015  Does Patient Have a Medical Advance Directive? Yes Yes Yes Yes Yes Yes No  Type of Paramedic of [place];Living will [place];Living will [place];Living will [place];Living will Living will [place];Living will -  Does patient want to make changes to medical advance directive? - - - No - Patient declined - Yes - information given -  Copy of Lindsay Hodges in Chart? - - - No - copy requested - No - copy requested -  Would patient like information on creating a  medical advance directive? - - - - - - No - patient declined information    Current Medications (verified) Outpatient Encounter Medications as of 06/20/2021  Medication Sig   amLODipine (NORVASC) 2.5 MG tablet Take 1 tablet (2.5 mg total) by mouth daily.   ezetimibe (ZETIA) 10 MG tablet Take 1 tablet (10 mg total) by mouth daily.   losartan (COZAAR) 100 MG tablet Take 1 tablet (100 mg total) by mouth daily.   SYNTHROID 75 MCG tablet Take 1 tablet (75 mcg total) by mouth daily before breakfast.   tiZANidine (ZANAFLEX) 4 MG tablet Take 1 tablet (4 mg total) by mouth every 6 (six) hours as needed for muscle spasms.   No facility-administered encounter medications on file as of 06/20/2021.    Allergies (verified) Penicillins and Statins   History: Past Medical History:  Diagnosis Date   AAA (abdominal aortic aneurysm) ([place])    Arthritis    Bursitis of hip 08/02/2015   Carotid disease, bilateral ([place])    [place] 10/15: R 40-59%, L 50-69%, stable for years   Elevated sed rate    remote history of myelosuppressive disorder   Grave's disease    s/p I-131 ablation 1990s   Hyperlipidemia    Hypertension    HYPOTHYROIDISM    Left knee pain 03/26/2014   Personal history of colonic adenoma 11/30/2002   Preventative health care 09/04/2015   Thrombophlebitis 10/16/2016   Vitamin D deficiency    Past Surgical History:  Procedure Laterality Date   COLONOSCOPY     Jaw surgery  2001   OOPHORECTOMY Bilateral  POLYPECTOMY     VAGINAL HYSTERECTOMY  2009   vaginal hysterectomy   Family History  Problem Relation Age of Onset   Hypertension Mother    Arthritis Mother    Hyperlipidemia Mother    Stroke Mother        1st age 69, then recurrent 66   Atrial fibrillation Mother    Epilepsy Mother    Arthritis Father    Breast cancer Sister    Cancer Sister        Recurrent Breast CA   Atrial fibrillation Brother    Von Willebrand disease Daughter    Other Daughter        POTS   Factor V  Leiden deficiency Daughter    Breast cancer Other    Drug abuse Maternal Aunt    Diabetes Neg Hx    Heart attack Neg Hx    Colon cancer Neg Hx    Esophageal cancer Neg Hx    Rectal cancer Neg Hx    Stomach cancer Neg Hx    Social History   Socioeconomic History   Marital status: Married    Spouse name: Not on file   Number of children: Not on file   Years of education: Not on file   Highest education level: Not on file  Occupational History    Comment: [employer]  Tobacco Use   Smoking status: Former    Packs/day: 0.50    Years: 12.00    Pack years: 6.00    Types: Cigarettes    Quit date: 02/06/1993    Years since quitting: 28.3   Smokeless tobacco: Never  Vaping Use   Vaping Use: Never used  Substance and Sexual Activity   Alcohol use: No   Drug use: No   Sexual activity: Yes  Other Topics Concern   Not on file  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   Financial Resource Strain: Not on file  Food Insecurity: Not on file  Transportation Needs: Not on file  Physical Activity: Not on file  Stress: Not on file  Social Connections: Not on file    Tobacco Counseling Counseling given: Not Answered   Clinical Intake:                 Diabetic?No         Activities of Daily Living In your present state of health, do you have any difficulty performing the following activities: 11/15/2020  Hearing? N  Vision? N  Difficulty concentrating or making decisions? N  Walking or climbing stairs? N  Dressing or bathing? N  Doing errands, shopping? N  Some recent data might be hidden    Patient Care Team: Mosie Lukes, MD as PCP - General (Family Medicine) Stanford Breed Denice Bors, MD as PCP - Cardiology (Cardiology) Gatha Mayer, MD (Gastroenterology) Camillo Flaming, OD as Referring Physician (Optometry)  Indicate any recent Medical Services you may have received from other than [facility] providers in the  past year (date may be approximate).     Assessment:   This is a routine wellness examination for Lindsay Hodges.  Hearing/Vision screen No results found.  Dietary issues and exercise activities discussed:     Goals Addressed   None    Depression Screen PHQ 2/9 Scores 05/18/2021 11/15/2020 10/29/2017  PHQ - 2 Score 0 0 0  PHQ- 9 Score - 4 -    Fall Risk Fall Risk  05/18/2021 11/15/2020  10/29/2017  Falls in the past year? 0 0 Yes  Number falls in past yr: 0 0 -  Injury with Fall? 0 0 No  Risk for fall due to : No Fall Risks - -  Follow up Falls evaluation completed - -    FALL RISK PREVENTION PERTAINING TO THE HOME:  Any stairs in or around the home? {YES/NO:21197} If so, are there any without handrails? {YES/NO:21197} Home free of loose throw rugs in walkways, pet beds, electrical cords, etc? {YES/NO:21197} Adequate lighting in your home to reduce risk of falls? {YES/NO:21197}  ASSISTIVE DEVICES UTILIZED TO PREVENT FALLS:  Life alert? {YES/NO:21197} Use of a cane, walker or w/c? {YES/NO:21197} Grab bars in the bathroom? {YES/NO:21197} Shower chair or bench in shower? {YES/NO:21197} Elevated toilet seat or a handicapped toilet? {YES/NO:21197}  TIMED UP AND GO:  Was the test performed? {YES/NO:21197}.  Length of time to ambulate 10 feet: *** sec.   {Appearance of DXAJ:2878676}  Cognitive Function:        Immunizations Immunization History  Administered Date(s) Administered   Influenza Inj Mdck Quad Pf 06/23/2017   Influenza Split 07/01/2012, 07/01/2013   Influenza-Unspecified 07/01/2012, 07/01/2013, 06/15/2014, 06/02/2015, 06/23/2017, 06/21/2020, 05/31/2021   Moderna Sars-Covid-2 Vaccination 11/05/2019, 12/01/2019, 08/19/2020   Pneumococcal Conjugate-13 05/04/2019   Pneumococcal Polysaccharide-23 11/15/2020   Tdap 10/02/2011   Zoster Recombinat (Shingrix) 03/27/2018, 06/03/2018   Zoster, Live 08/23/2014    TDAP status: Up to date  Flu Vaccine status: Up to  date  Pneumococcal vaccine status: Up to date  {Covid-19 vaccine status:2101808}  Qualifies for Shingles Vaccine? No   Zostavax completed Yes   Shingrix Completed?: Yes  Screening Tests Health Maintenance  Topic Date Due   COVID-19 Vaccine (4 - Booster for Moderna series) 12/17/2020   TETANUS/TDAP  10/01/2021   MAMMOGRAM  10/26/2022   COLONOSCOPY (Pts 45-25yrs Insurance coverage will need to be confirmed)  08/01/2023   INFLUENZA VACCINE  Completed   DEXA SCAN  Completed   Hepatitis C Screening  Completed   Zoster Vaccines- Shingrix  Completed   HPV VACCINES  Aged Out    Health Maintenance  Health Maintenance Due  Topic Date Due   COVID-19 Vaccine (4 - Booster for Moderna series) 12/17/2020    Colorectal cancer screening: Type of screening: Colonoscopy. Completed 07/31/2018. Repeat every 5 years  Mammogram status: Completed bilateral 10/26/2020. Repeat every year  {Bone Density status:21018021}  Lung Cancer Screening: (Low Dose CT Chest recommended if Age 65-80 years, 30 pack-year currently smoking OR have quit w/in 15years.) does not qualify.     Additional Screening:  Hepatitis C Screening: Completed 04/06/2019  Vision Screening: Recommended annual ophthalmology exams for early detection of glaucoma and other disorders of the eye. Is the patient up to date with their annual eye exam?  {YES/NO:21197} Who is the provider or what is the name of the office in which the patient attends annual eye exams? *** If pt is not established with a provider, would they like to be referred to a provider to establish care? {YES/NO:21197}.   Dental Screening: Recommended annual dental exams for proper oral hygiene  Community Resource Referral / Chronic Care Management: CRR required this visit?  {YES/NO:21197}  CCM required this visit?  {YES/NO:21197}     Plan:     I have personally reviewed and noted the following in the patient's chart:   Medical and social history Use  of alcohol, tobacco or illicit drugs  Current medications and supplements including opioid prescriptions. {Opioid [phone]}  Functional ability and status Nutritional status Physical activity Advanced directives List of other physicians Hospitalizations, surgeries, and ER visits in previous 12 months Vitals Screenings to include cognitive, depression, and falls Referrals and appointments  In addition, I have reviewed and discussed with patient certain preventive protocols, quality metrics, and best practice recommendations. A written personalized care plan for preventive services as well as general preventive health recommendations were provided to patient.   Due to this being a telephonic visit, the after visit summary with patients personalized plan was offered to patient via mail or my-chart. ***Patient declined at this time./ Patient would like to access on my-chart/ per request, patient was mailed a copy of AVS./ Patient preferred to pick up at office at next visit.   Marta Antu, LPN   [id number]  Nurse Health Advisor  Nurse Notes: ***

## 2021-06-23 ENCOUNTER — Encounter: Payer: Self-pay | Admitting: Family Medicine

## 2021-09-06 ENCOUNTER — Encounter (HOSPITAL_BASED_OUTPATIENT_CLINIC_OR_DEPARTMENT_OTHER): Payer: Self-pay | Admitting: Emergency Medicine

## 2021-09-06 ENCOUNTER — Emergency Department (HOSPITAL_BASED_OUTPATIENT_CLINIC_OR_DEPARTMENT_OTHER)
Admission: EM | Admit: 2021-09-06 | Discharge: 2021-09-06 | Disposition: A | Discharge status: Home / Self Care | Payer: Medicare Other | Attending: Emergency Medicine | Admitting: Emergency Medicine

## 2021-09-06 ENCOUNTER — Emergency Department (HOSPITAL_BASED_OUTPATIENT_CLINIC_OR_DEPARTMENT_OTHER): Payer: Medicare Other

## 2021-09-06 ENCOUNTER — Other Ambulatory Visit: Payer: Self-pay

## 2021-09-06 ENCOUNTER — Telehealth: Payer: Self-pay

## 2021-09-06 DIAGNOSIS — I714 Abdominal aortic aneurysm, without rupture, unspecified: Secondary | ICD-10-CM | POA: Insufficient documentation

## 2021-09-06 DIAGNOSIS — Z79899 Other long term (current) drug therapy: Secondary | ICD-10-CM | POA: Diagnosis not present

## 2021-09-06 DIAGNOSIS — E039 Hypothyroidism, unspecified: Secondary | ICD-10-CM | POA: Insufficient documentation

## 2021-09-06 DIAGNOSIS — R0789 Other chest pain: Secondary | ICD-10-CM | POA: Insufficient documentation

## 2021-09-06 DIAGNOSIS — I10 Essential (primary) hypertension: Secondary | ICD-10-CM | POA: Insufficient documentation

## 2021-09-06 DIAGNOSIS — R252 Cramp and spasm: Secondary | ICD-10-CM | POA: Insufficient documentation

## 2021-09-06 DIAGNOSIS — Z86018 Personal history of other benign neoplasm: Secondary | ICD-10-CM | POA: Insufficient documentation

## 2021-09-06 DIAGNOSIS — Z006 Encounter for examination for normal comparison and control in clinical research program: Secondary | ICD-10-CM

## 2021-09-06 DIAGNOSIS — R079 Chest pain, unspecified: Secondary | ICD-10-CM

## 2021-09-06 DIAGNOSIS — Z87891 Personal history of nicotine dependence: Secondary | ICD-10-CM | POA: Insufficient documentation

## 2021-09-06 LAB — CBC WITH DIFFERENTIAL/PLATELET
Abs Immature Granulocytes: 0.02 10*3/uL (ref 0.00–0.07)
Basophils Absolute: 0.1 10*3/uL (ref 0.0–0.1)
Basophils Relative: 1 %
Eosinophils Absolute: 0.3 10*3/uL (ref 0.0–0.5)
Eosinophils Relative: 5 %
HCT: 40.1 % (ref 36.0–46.0)
Hemoglobin: 13 g/dL (ref 12.0–15.0)
Immature Granulocytes: 0 %
Lymphocytes Relative: 24 %
Lymphs Abs: 1.3 10*3/uL (ref 0.7–4.0)
MCH: 29.3 pg (ref 26.0–34.0)
MCHC: 32.4 g/dL (ref 30.0–36.0)
MCV: 90.3 fL (ref 80.0–100.0)
Monocytes Absolute: 0.7 10*3/uL (ref 0.1–1.0)
Monocytes Relative: 13 %
Neutro Abs: 3.1 10*3/uL (ref 1.7–7.7)
Neutrophils Relative %: 57 %
Platelets: 202 10*3/uL (ref 150–400)
RBC: 4.44 MIL/uL (ref 3.87–5.11)
RDW: 14.1 % (ref 11.5–15.5)
WBC: 5.4 10*3/uL (ref 4.0–10.5)
nRBC: 0 % (ref 0.0–0.2)

## 2021-09-06 LAB — BASIC METABOLIC PANEL
Anion gap: 7 (ref 5–15)
BUN: 16 mg/dL (ref 8–23)
CO2: 24 mmol/L (ref 22–32)
Calcium: 9 mg/dL (ref 8.9–10.3)
Chloride: 107 mmol/L (ref 98–111)
Creatinine, Ser: 0.68 mg/dL (ref 0.44–1.00)
GFR, Estimated: >60 mL/min (ref >60)
Glucose, Bld: 99 mg/dL (ref 70–99)
Potassium: 4.1 mmol/L (ref 3.5–5.1)
Sodium: 138 mmol/L (ref 135–145)

## 2021-09-06 LAB — TROPONIN I (HIGH SENSITIVITY)
Troponin I (High Sensitivity): 2 ng/L (ref <18)
Troponin I (High Sensitivity): 2 ng/L (ref <18)

## 2021-09-06 NOTE — Discharge Instructions (Addendum)
You were seen in the emergency department for evaluation of chest pain and some leg cramps.  You had an EKG chest x-ray blood work and an ultrasound of your abdomen that did not show an obvious explanation for your symptoms.  Please follow-up with your primary care doctor.  Return to the emergency department if any worsening or concerning symptoms.

## 2021-09-06 NOTE — Telephone Encounter (Signed)
Called patient for follow up with the VESALIUS study. She wishes to hold the IP medication at this time until she has more lab work drawn at the end of the month. Once she gets the results she will let research know whether or not she wants to continue with the IP. Patient is okay with telephone follow ups at this time. Will contact her after the new year.

## 2021-09-06 NOTE — ED Triage Notes (Signed)
Pt reports she has one episode of chest pain briefly last night then resolved. Also reports left chest burning this am. Denies any pain at this time. Woke up with cramps in lower legs and now resolved

## 2021-09-06 NOTE — ED Provider Notes (Signed)
[facility] EMERGENCY DEPARTMENT Provider Note   CSN: [id number] Arrival date & time: 09/06/21  0740     History Chief Complaint  Patient presents with   Chest Pain    Lindsay Hodges is a 65 y.o. female.  She said yesterday evening that she experienced a brief left-sided chest pain.  She said like a bubble burst in her chest.  It only lasted a few seconds and then she has a very mild burning left chest discomfort.  It was still there this morning.  She also had some leg cramps this morning.  Those have resolved.  No abdominal or back pain.  She has a known 4 cm AAA that was discovered incidentally during a liver work-up this fall.  No numbness or weakness.  No shortness of breath cough fevers chills.  No dizziness or diaphoresis.  The history is provided by the patient.  Chest Pain Pain location:  L chest Pain quality: burning and shooting   Pain radiates to:  Does not radiate Pain severity:  Mild Onset quality:  Sudden Duration:  12 hours Timing:  Constant Progression:  Improving Chronicity:  New Context: at rest   Relieved by:  None tried Worsened by:  Nothing Ineffective treatments:  None tried Associated symptoms: no abdominal pain, no back pain, no cough, no diaphoresis, no dizziness, no dysphagia, no fever, no headache, no lower extremity edema, no nausea, no numbness, no palpitations, no shortness of breath, no vomiting and no weakness   Risk factors: aortic disease, high cholesterol and hypertension       Past Medical History:  Diagnosis Date   AAA (abdominal aortic aneurysm)    Arthritis    Bursitis of hip 08/02/2015   Carotid disease, bilateral ([place])    [place] 10/15: R 40-59%, L 50-69%, stable for years   Elevated sed rate    remote history of myelosuppressive disorder   Grave's disease    s/p I-131 ablation 1990s   Hyperlipidemia    Hypertension    HYPOTHYROIDISM    Left knee pain 03/26/2014   Personal history of colonic adenoma 11/30/2002    Preventative health care 09/04/2015   Thrombophlebitis 10/16/2016   Vitamin D deficiency     Patient Active Problem List   Diagnosis Date Noted   Sinusitis 11/16/2020   Low serum vitamin B12 11/15/2020   Peripheral vascular disease ([place]) 11/09/2019   Elevated liver enzymes 05/04/2019   Insomnia 03/15/2019   Thoracic back pain 11/03/2018   Fall 02/25/2018   Elevated sed rate    Cervical cancer screening 03/01/2017   Hyperglycemia 10/16/2016   Plantar fasciitis, left 01/10/2016   Preventative health care 09/04/2015   Sun-damaged skin 09/04/2015   Bursitis of hip 08/02/2015   Low back pain radiating to both legs 03/30/2015   Trochanteric bursitis of right hip 02/14/2015   Left knee pain 03/26/2014   Seasonal affective disorder ([place]) 11/25/2012   Vitamin D deficiency 02/14/2012   Hyperlipidemia    Hypertension    Graves' disease    Arthritis    Carotid disease, bilateral ([place])    CAROTID BRUIT, LEFT 04/11/2009   Hypothyroidism 01/06/2009   Personal history of colonic adenoma 11/30/2002    Past Surgical History:  Procedure Laterality Date   COLONOSCOPY     Jaw surgery  2001   OOPHORECTOMY Bilateral    POLYPECTOMY     VAGINAL HYSTERECTOMY  2009   vaginal hysterectomy     OB History     Gravida  3   Para  3   Term      Preterm      AB      Living  3      SAB      IAB      Ectopic      Multiple  1   Live Births              Family History  Problem Relation Age of Onset   Hypertension Mother    Arthritis Mother    Hyperlipidemia Mother    Stroke Mother        1st age 35, then recurrent 40   Atrial fibrillation Mother    Epilepsy Mother    Arthritis Father    Breast cancer Sister    Cancer Sister        Recurrent Breast CA   Atrial fibrillation Brother    Von Willebrand disease Daughter    Other Daughter        POTS   Factor V Leiden deficiency Daughter    Breast cancer Other    Drug abuse Maternal Aunt    Diabetes Neg Hx     Heart attack Neg Hx    Colon cancer Neg Hx    Esophageal cancer Neg Hx    Rectal cancer Neg Hx    Stomach cancer Neg Hx     Social History   Tobacco Use   Smoking status: Former    Packs/day: 0.50    Years: 12.00    Pack years: 6.00    Types: Cigarettes    Quit date: 02/06/1993    Years since quitting: 28.6   Smokeless tobacco: Never  Vaping Use   Vaping Use: Never used  Substance Use Topics   Alcohol use: No   Drug use: No    Home Medications Prior to Admission medications   Medication Sig Start Date End Date Taking? Authorizing Provider  amLODipine (NORVASC) 2.5 MG tablet Take 1 tablet (2.5 mg total) by mouth daily. 11/09/20 09/06/21 Yes Lelon Perla, MD  ezetimibe (ZETIA) 10 MG tablet Take 1 tablet (10 mg total) by mouth daily. 11/09/20  Yes Lelon Perla, MD  losartan (COZAAR) 100 MG tablet Take 1 tablet (100 mg total) by mouth daily. 11/09/20  Yes Lelon Perla, MD  SYNTHROID 75 MCG tablet Take 1 tablet (75 mcg total) by mouth daily before breakfast. 12/12/20  Yes Mosie Lukes, MD  tiZANidine (ZANAFLEX) 4 MG tablet Take 1 tablet (4 mg total) by mouth every 6 (six) hours as needed for muscle spasms. 05/18/21   Mosie Lukes, MD    Allergies    Penicillins and Statins  Review of Systems   Review of Systems  Constitutional:  Negative for diaphoresis and fever.  HENT:  Negative for sore throat and trouble swallowing.   Eyes:  Negative for visual disturbance.  Respiratory:  Negative for cough and shortness of breath.   Cardiovascular:  Positive for chest pain. Negative for palpitations.  Gastrointestinal:  Negative for abdominal pain, nausea and vomiting.  Genitourinary:  Negative for dysuria.  Musculoskeletal:  Negative for back pain.  Skin:  Negative for rash.  Neurological:  Negative for dizziness, weakness, numbness and headaches.   Physical Exam Updated Vital Signs BP (!) 162/68 (BP Location: Left Arm)   Pulse 81   Temp 98.1 F (36.7 C) (Oral)    Resp 20   Ht 5\' 5"  (1.651 m)   Wt 66.7 kg  SpO2 98%   BMI 24.46 kg/m   Physical Exam Vitals and nursing note reviewed.  Constitutional:      General: She is not in acute distress.    Appearance: She is well-developed.  HENT:     Head: Normocephalic and atraumatic.  Eyes:     Conjunctiva/sclera: Conjunctivae normal.  Cardiovascular:     Rate and Rhythm: Normal rate and regular rhythm.     Heart sounds: Normal heart sounds. No murmur heard. Pulmonary:     Effort: Pulmonary effort is normal. No respiratory distress.     Breath sounds: Normal breath sounds.  Abdominal:     Palpations: Abdomen is soft. There is no mass.     Tenderness: There is no abdominal tenderness. There is no guarding or rebound.  Musculoskeletal:        General: No swelling. Normal range of motion.     Cervical back: Neck supple.     Right lower leg: No tenderness. No edema.     Left lower leg: No tenderness. No edema.  Skin:    General: Skin is warm and dry.     Capillary Refill: Capillary refill takes less than 2 seconds.  Neurological:     General: No focal deficit present.     Mental Status: She is alert.  Psychiatric:        Mood and Affect: Mood normal.    ED Results / Procedures / Treatments   Labs (all labs ordered are listed, but only abnormal results are displayed) Labs Reviewed  BASIC METABOLIC PANEL  CBC WITH DIFFERENTIAL/PLATELET  TROPONIN I (HIGH SENSITIVITY)  TROPONIN I (HIGH SENSITIVITY)    EKG EKG Interpretation  Date/Time:  Wednesday September 06 2021 07:54:21 EST Ventricular Rate:  74 PR Interval:  160 QRS Duration: 95 QT Interval:  419 QTC Calculation: 465 R Axis:   32 Text Interpretation: Sinus rhythm No old tracing to compare Confirmed by Aletta Edouard [phone]) on 09/06/2021 8:02:12 AM  Radiology US Aorta  Result Date: 09/06/2021 CLINICAL DATA:  65 year old with leg cramps. Evaluate abdominal aortic aneurysm. EXAM: ULTRASOUND OF ABDOMINAL AORTA TECHNIQUE:  Ultrasound examination of the abdominal aorta and proximal common iliac arteries was performed to evaluate for aneurysm. Additional color and Doppler images of the distal aorta were obtained to document patency. COMPARISON:  Ultrasound 05/22/2021 FINDINGS: Abdominal aortic measurements as follows: Proximal:  3.3 cm Mid: 2.2 cm Distal:  1.6 cm Patent: Yes, peak systolic velocity is 58 cm/s Right common iliac artery: 0.8 cm Left common iliac artery: 0.9 cm Irregularity of the abdominal aortic wall is compatible with atherosclerotic disease. IMPRESSION: 1. Proximal abdominal aorta is mildly aneurysmal measuring up to 3.3 cm. Recommend follow-up ultrasound every 3 years. This recommendation follows ACR consensus guidelines: White Paper of the ACR Incidental Findings Committee II on Vascular Findings. J Am Coll Radiol 2013; 10:789-794. 2.  Aortic Atherosclerosis (ICD10-I70.0). Electronically Signed   By: Markus Daft M.D.   On: 09/06/2021 09:28   DG Chest Port 1 View  Result Date: 09/06/2021 CLINICAL DATA:  Chest pain EXAM: PORTABLE CHEST 1 VIEW COMPARISON:  None. FINDINGS: The heart size and mediastinal contours are within normal limits. Both lungs are clear. No pleural effusion or pneumothorax. Visualized skeletal structures are unremarkable. IMPRESSION: No active disease. Electronically Signed   By: Macy Mis M.D.   On: 09/06/2021 08:25    Procedures Procedures   Medications Ordered in ED Medications - No data to display  ED Course  I have reviewed the  triage vital signs and the nursing notes.  Pertinent labs & imaging results that were available during my care of the patient were reviewed by me and considered in my medical decision making (see chart for details).  Clinical Course as of 09/06/21 1837  Wed Sep 06, 2021  0829 Chest x-ray interpreted by me as no acute findings.  Awaiting radiology reading [MB]  [phone] Aortic ultrasound shows minimally dilated proximal aorta. [MB]  [id number] Patient's  work-up has been unremarkable.  Reviewed this with her and she is comfortable plan for discharge and outpatient follow-up with PCP.  Return instructions discussed [MB]    Clinical Course User Index [MB] Hayden Rasmussen, MD   MDM Rules/Calculators/A&P                          This patient complains of chest pain and leg cramps; this involves an extensive number of treatment Options and is a complaint that carries with it a high risk of complications and Morbidity. The differential includes ACS, reflux, arrhythmia, pneumoThorax, PE vascular, musculoskeletal  I ordered, reviewed and interpreted labs, which included CBC with normal white count normal hemoglobin, chemistries normal, troponins flat  I ordered imaging studies which included chest x-ray and abdominal aortic ultrasound and I independently    visualized and interpreted imaging which showed no acute findings.  Proximal aorta mildly enlarged  Previous records obtained and reviewed in epic, no recent admissions  After the interventions stated above, I reevaluated the patient and found the patient to be currently asymptomatic.  Reviewed results with her and she is comfortable plan for outpatient follow-up with her treating providers.  Return instructions discussed   Final Clinical Impression(s) / ED Diagnoses Final diagnoses:  Nonspecific chest pain  Leg cramps    Rx / DC Orders ED Discharge Orders     None        Hayden Rasmussen, MD 09/06/21 1840

## 2021-09-18 DIAGNOSIS — H2513 Age-related nuclear cataract, bilateral: Secondary | ICD-10-CM | POA: Diagnosis not present

## 2021-09-18 DIAGNOSIS — H16223 Keratoconjunctivitis sicca, not specified as Sjogren's, bilateral: Secondary | ICD-10-CM | POA: Diagnosis not present

## 2021-09-19 ENCOUNTER — Other Ambulatory Visit: Payer: Medicare Other

## 2021-09-26 DIAGNOSIS — L700 Acne vulgaris: Secondary | ICD-10-CM | POA: Diagnosis not present

## 2021-09-26 DIAGNOSIS — D485 Neoplasm of uncertain behavior of skin: Secondary | ICD-10-CM | POA: Diagnosis not present

## 2021-09-26 DIAGNOSIS — D225 Melanocytic nevi of trunk: Secondary | ICD-10-CM | POA: Diagnosis not present

## 2021-09-26 DIAGNOSIS — L578 Other skin changes due to chronic exposure to nonionizing radiation: Secondary | ICD-10-CM | POA: Diagnosis not present

## 2021-09-26 DIAGNOSIS — L821 Other seborrheic keratosis: Secondary | ICD-10-CM | POA: Diagnosis not present

## 2021-09-29 DIAGNOSIS — E0500 Thyrotoxicosis with diffuse goiter without thyrotoxic crisis or storm: Secondary | ICD-10-CM | POA: Diagnosis not present

## 2021-09-29 DIAGNOSIS — E039 Hypothyroidism, unspecified: Secondary | ICD-10-CM | POA: Diagnosis not present

## 2021-09-29 DIAGNOSIS — R739 Hyperglycemia, unspecified: Secondary | ICD-10-CM | POA: Diagnosis not present

## 2021-09-29 DIAGNOSIS — E782 Mixed hyperlipidemia: Secondary | ICD-10-CM | POA: Diagnosis not present

## 2021-09-29 DIAGNOSIS — I10 Essential (primary) hypertension: Secondary | ICD-10-CM | POA: Diagnosis not present

## 2021-10-03 DIAGNOSIS — F338 Other recurrent depressive disorders: Secondary | ICD-10-CM | POA: Diagnosis not present

## 2021-10-03 DIAGNOSIS — I6523 Occlusion and stenosis of bilateral carotid arteries: Secondary | ICD-10-CM | POA: Diagnosis not present

## 2021-10-03 DIAGNOSIS — I10 Essential (primary) hypertension: Secondary | ICD-10-CM | POA: Diagnosis not present

## 2021-10-03 DIAGNOSIS — R748 Abnormal levels of other serum enzymes: Secondary | ICD-10-CM | POA: Diagnosis not present

## 2021-10-03 DIAGNOSIS — E782 Mixed hyperlipidemia: Secondary | ICD-10-CM | POA: Diagnosis not present

## 2021-10-03 DIAGNOSIS — R7303 Prediabetes: Secondary | ICD-10-CM | POA: Diagnosis not present

## 2021-10-03 DIAGNOSIS — I714 Abdominal aortic aneurysm, without rupture, unspecified: Secondary | ICD-10-CM | POA: Diagnosis not present

## 2021-10-03 DIAGNOSIS — E039 Hypothyroidism, unspecified: Secondary | ICD-10-CM | POA: Diagnosis not present

## 2021-10-03 DIAGNOSIS — Z0000 Encounter for general adult medical examination without abnormal findings: Secondary | ICD-10-CM | POA: Diagnosis not present

## 2021-10-11 NOTE — Progress Notes (Signed)
HPI: FU cerebrovascular disease. Echocardiogram in 2004 showed normal LV function. Nuclear study January 2017 showed ejection fraction 58% and normal perfusion. ABIs March 2021 normal.  Carotid Dopplers March 2022 showed 1 to 39% right and 40 to 59% left stenosis.  Abdominal ultrasound December 2022 showed abdominal aortic aneurysm 3.3 cm.  Aortic atherosclerosis noted.  Patient seen with chest pain December 2022.  Troponins were normal.  Since last seen, she denies dyspnea on exertion, orthopnea, PND, pedal edema, exertional chest pain or syncope.  Current Outpatient Medications  Medication Sig Dispense Refill   losartan (COZAAR) 100 MG tablet Take 1 tablet (100 mg total) by mouth daily. 90 tablet 3   SYNTHROID 75 MCG tablet Take 1 tablet (75 mcg total) by mouth daily before breakfast. 90 tablet 3   tiZANidine (ZANAFLEX) 4 MG tablet Take 1 tablet (4 mg total) by mouth every 6 (six) hours as needed for muscle spasms. 30 tablet 0   amLODipine (NORVASC) 5 MG tablet Take 1 tablet (5 mg total) by mouth daily. 90 tablet 3   No current facility-administered medications for this visit.     Past Medical History:  Diagnosis Date   AAA (abdominal aortic aneurysm)    Arthritis    Bursitis of hip 08/02/2015   Carotid disease, bilateral ([facility])    [place] 10/15: R 40-59%, L 50-69%, stable for years   Elevated sed rate    remote history of myelosuppressive disorder   Grave's disease    s/p I-131 ablation 1990s   Hyperlipidemia    Hypertension    HYPOTHYROIDISM    Left knee pain 03/26/2014   Personal history of colonic adenoma 11/30/2002   Preventative health care 09/04/2015   Thrombophlebitis 10/16/2016   Vitamin D deficiency     Past Surgical History:  Procedure Laterality Date   COLONOSCOPY     Jaw surgery  2001   OOPHORECTOMY Bilateral    POLYPECTOMY     VAGINAL HYSTERECTOMY  2009   vaginal hysterectomy    Social History   Socioeconomic History   Marital status: Married     Spouse name: Not on file   Number of children: Not on file   Years of education: Not on file   Highest education level: Not on file  Occupational History    Comment: [employer]  Tobacco Use   Smoking status: Former    Packs/day: 0.50    Years: 12.00    Pack years: 6.00    Types: Cigarettes    Quit date: 02/06/1993    Years since quitting: 28.7   Smokeless tobacco: Never  Vaping Use   Vaping Use: Never used  Substance and Sexual Activity   Alcohol use: No   Drug use: No   Sexual activity: Yes  Other Topics Concern   Not on file  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   Financial Resource Strain: Not on file  Food Insecurity: Not on file  Transportation Needs: Not on file  Physical Activity: Not on file  Stress: Not on file  Social Connections: Not on file  Intimate Partner Violence: Not on file    Family History  Problem Relation Age of Onset   Hypertension Mother    Arthritis Mother    Hyperlipidemia Mother    Stroke Mother        1st age 41, then recurrent 69   Atrial fibrillation Mother    Epilepsy  Mother    Arthritis Father    Breast cancer Sister    Cancer Sister        Recurrent Breast CA   Atrial fibrillation Brother    Von Willebrand disease Daughter    Other Daughter        POTS   Factor V Leiden deficiency Daughter    Breast cancer Other    Drug abuse Maternal Aunt    Diabetes Neg Hx    Heart attack Neg Hx    Colon cancer Neg Hx    Esophageal cancer Neg Hx    Rectal cancer Neg Hx    Stomach cancer Neg Hx     ROS: no fevers or chills, productive cough, hemoptysis, dysphasia, odynophagia, melena, hematochezia, dysuria, hematuria, rash, seizure activity, orthopnea, PND, pedal edema, claudication. Remaining systems are negative.  Physical Exam: Well-developed well-nourished in no acute distress.  Skin is warm and dry.  HEENT is normal.  Neck is supple.  Chest is clear to auscultation with  normal expansion.  Cardiovascular exam is regular rate and rhythm.  Abdominal exam nontender or distended. No masses palpated. Extremities show no edema. neuro grossly intact  ECG-September 06, 2021-sinus rhythm with no ST changes.  Personally reviewed  A/P  1 carotid artery disease-patient is followed by vascular surgery.  She will need follow-up carotid Dopplers March 2023.  2 abdominal aortic aneurysm-plan follow-up ultrasound December 2023.  This is also followed by vascular surgery.  3 hypertension-blood pressure elevated; increase amlodipine to 5 mg daily.  Goal systolic blood pressure approximately 130.  4 hyperlipidemia-patient is intolerant to statins.  Primary care discontinued her Zetia due to elevated liver functions.  I think Zetia is unlikely to have caused this.  She is scheduled to have follow-up labs in approximately 8 weeks.  We will review and add medications for her cholesterol as needed.  5 recent episode of chest pain-no electrocardiogram showed no ST changes and troponins were normal.  Symptoms atypical.  She has had no problems since then.  We will not pursue further ischemia evaluation at this point.  Kirk Ruths, MD

## 2021-10-12 DIAGNOSIS — D0472 Carcinoma in situ of skin of left lower limb, including hip: Secondary | ICD-10-CM | POA: Diagnosis not present

## 2021-10-20 DIAGNOSIS — H0288B Meibomian gland dysfunction left eye, upper and lower eyelids: Secondary | ICD-10-CM | POA: Diagnosis not present

## 2021-10-20 DIAGNOSIS — H16223 Keratoconjunctivitis sicca, not specified as Sjogren's, bilateral: Secondary | ICD-10-CM | POA: Diagnosis not present

## 2021-10-20 DIAGNOSIS — H0288A Meibomian gland dysfunction right eye, upper and lower eyelids: Secondary | ICD-10-CM | POA: Diagnosis not present

## 2021-10-23 ENCOUNTER — Other Ambulatory Visit: Payer: Self-pay

## 2021-10-23 ENCOUNTER — Encounter: Payer: Self-pay | Admitting: Cardiology

## 2021-10-23 ENCOUNTER — Ambulatory Visit (INDEPENDENT_AMBULATORY_CARE_PROVIDER_SITE_OTHER): Payer: Medicare Other | Admitting: Cardiology

## 2021-10-23 VITALS — BP 150/65 | HR 68 | Ht 65.0 in | Wt 147.1 lb

## 2021-10-23 DIAGNOSIS — I10 Essential (primary) hypertension: Secondary | ICD-10-CM | POA: Diagnosis not present

## 2021-10-23 DIAGNOSIS — E7800 Pure hypercholesterolemia, unspecified: Secondary | ICD-10-CM | POA: Diagnosis not present

## 2021-10-23 DIAGNOSIS — I6523 Occlusion and stenosis of bilateral carotid arteries: Secondary | ICD-10-CM

## 2021-10-23 DIAGNOSIS — R072 Precordial pain: Secondary | ICD-10-CM

## 2021-10-23 DIAGNOSIS — I7141 Pararenal abdominal aortic aneurysm, without rupture: Secondary | ICD-10-CM

## 2021-10-23 MED ORDER — AMLODIPINE BESYLATE 5 MG PO TABS: 5.0000 mg | ORAL_TABLET | Freq: Every day | ORAL | 3 refills | Status: DC

## 2021-10-23 NOTE — Patient Instructions (Signed)
Medication Instructions:   INCREASE AMLODIPINE TO 5 MG ONCE DAILY= 2 OF THE 2.5 MG TABLETS ONCE DAILY  *If you need a refill on your cardiac medications before your next appointment, please call your pharmacy*   Follow-Up: At [facility], you and your health needs are our priority.  As part of our continuing mission to provide you with exceptional heart care, we have created designated Provider Care Teams.  These Care Teams include your primary Cardiologist (physician) and Advanced Practice Providers (APPs -  Physician Assistants and Nurse Practitioners) who all work together to provide you with the care you need, when you need it.  We recommend signing up for the patient portal called "MyChart".  Sign up information is provided on this After Visit Summary.  MyChart is used to connect with patients for Virtual Visits (Telemedicine).  Patients are able to view lab/test results, encounter notes, upcoming appointments, etc.  Non-urgent messages can be sent to your provider as well.   To learn more about what you can do with MyChart, go to [email or web address].    Your next appointment:   12 month(s)  The format for your next appointment:   In Person  Provider:   Kirk Ruths, MD

## 2021-11-21 DIAGNOSIS — Z1231 Encounter for screening mammogram for malignant neoplasm of breast: Secondary | ICD-10-CM | POA: Diagnosis not present

## 2021-11-28 ENCOUNTER — Ambulatory Visit: Payer: Medicare Other | Admitting: Family Medicine

## 2021-11-30 ENCOUNTER — Ambulatory Visit: Payer: Medicare Other | Admitting: Family Medicine

## 2021-12-01 ENCOUNTER — Other Ambulatory Visit: Payer: Self-pay

## 2021-12-01 DIAGNOSIS — I6523 Occlusion and stenosis of bilateral carotid arteries: Secondary | ICD-10-CM

## 2021-12-16 ENCOUNTER — Other Ambulatory Visit: Payer: Self-pay | Admitting: Cardiology

## 2021-12-16 DIAGNOSIS — I10 Essential (primary) hypertension: Secondary | ICD-10-CM

## 2021-12-18 NOTE — Telephone Encounter (Signed)
The original prescription was reordered on 10/23/2021 by Cristopher Estimable, RN. ?

## 2021-12-19 DIAGNOSIS — Z006 Encounter for examination for normal comparison and control in clinical research program: Secondary | ICD-10-CM

## 2021-12-19 MED ORDER — STUDY - VESALIUS - EVOLOCUMAB (AMG 145) 140 MG/ML OR PLACEBO SQ INJECTION (PI-HILTY): 140.0000 mg | INJECTION | SUBCUTANEOUS | Status: DC

## 2021-12-19 NOTE — Progress Notes (Signed)
[facility] [place] ?HISTORY AND PHYSICAL  ? ?History of Present Illness:  Patient is a 66 y.o. year old female who presents for evaluation of carotid stenosis.  She is followed by Dr. Donzetta Matters in our office.  She has a history of bilateral carotid stenosis that is asymptomatic.  She denies amaurosis, weakness or aphasia.   ? She has a 4 cm AAA that is asymptomatic.  The last duplex showed 4 cm in the largest diameter.  We will repeat the duplex in 1 year.  She denies abdominal pain or lumbar pain.  No symptoms of claudication or rest pain.   ? She manages her HTN with cozaar and amlodipine.  She has an allergy to Statins. ? ?Past Medical History:  ?Diagnosis Date  ? AAA (abdominal aortic aneurysm)   ? Arthritis   ? Bursitis of hip 08/02/2015  ? Carotid disease, bilateral ([place])   ? [place] 10/15: R 40-59%, L 50-69%, stable for years  ? Elevated sed rate   ? remote history of myelosuppressive disorder  ? Grave's disease   ? s/p I-131 ablation 1990s  ? Hyperlipidemia   ? Hypertension   ? HYPOTHYROIDISM   ? Left knee pain 03/26/2014  ? Personal history of colonic adenoma 11/30/2002  ? Preventative health care 09/04/2015  ? Thrombophlebitis 10/16/2016  ? Vitamin D deficiency   ? ? ?Past Surgical History:  ?Procedure Laterality Date  ? COLONOSCOPY    ? Jaw surgery  2001  ? OOPHORECTOMY Bilateral   ? POLYPECTOMY    ? VAGINAL HYSTERECTOMY  2009  ? vaginal hysterectomy  ? ? ? ?Social History ?Social History  ? ?Tobacco Use  ? Smoking status: Former  ?  Packs/day: 0.50  ?  Years: 12.00  ?  Pack years: 6.00  ?  Types: Cigarettes  ?  Quit date: 02/06/1993  ?  Years since quitting: 28.8  ? Smokeless tobacco: Never  ?Vaping Use  ? Vaping Use: Never used  ?Substance Use Topics  ? Alcohol use: No  ? Drug use: No  ? ? ?Family History ?Family History  ?Problem Relation Age of Onset  ? Hypertension Mother   ? Arthritis Mother   ? Hyperlipidemia Mother   ? Stroke Mother   ?     1st age 30, then recurrent 17  ? Atrial  fibrillation Mother   ? Epilepsy Mother   ? Arthritis Father   ? Breast cancer Sister   ? Cancer Sister   ?     Recurrent Breast CA  ? Atrial fibrillation Brother   ? Von Willebrand disease Daughter   ? Other Daughter   ?     POTS  ? Factor V Leiden deficiency Daughter   ? Breast cancer Other   ? Drug abuse Maternal Aunt   ? Diabetes Neg Hx   ? Heart attack Neg Hx   ? Colon cancer Neg Hx   ? Esophageal cancer Neg Hx   ? Rectal cancer Neg Hx   ? Stomach cancer Neg Hx   ? ? ?Allergies ? ?Allergies  ?Allergen Reactions  ? Penicillins Swelling  ?  REACTION: swelling/dyspnea ?REACTION: swelling/dyspnea ?REACTION: swelling/dyspnea  ? Statins Other (See Comments)  ?  Myalgias on several statins  ? ? ? ?Current Outpatient Medications  ?Medication Sig Dispense Refill  ? amLODipine (NORVASC) 5 MG tablet Take 1 tablet (5 mg total) by mouth daily. 90 tablet 3  ? losartan (COZAAR) 100 MG tablet Take 1 tablet (100  mg total) by mouth daily. 90 tablet 3  ? SYNTHROID 75 MCG tablet Take 1 tablet (75 mcg total) by mouth daily before breakfast. 90 tablet 3  ? tiZANidine (ZANAFLEX) 4 MG tablet Take 1 tablet (4 mg total) by mouth every 6 (six) hours as needed for muscle spasms. 30 tablet 0  ? ?Current Facility-Administered Medications  ?Medication Dose Route Frequency Provider Last Rate Last Admin  ? Study - VESALIUS - evolocumab (AMG 145) 140 mg/mL or placebo SQ injection (PI-Hilty)  140 mg Subcutaneous Q14 Days Hilty, Nadean Corwin, MD      ? ? ?ROS:  ? ?General:  No weight loss, Fever, chills ? ?HEENT: No recent headaches, no nasal bleeding, no visual changes, no sore throat ? ?Neurologic: No dizziness, blackouts, seizures. No recent symptoms of stroke or mini- stroke. No recent episodes of slurred speech, or temporary blindness. ? ?Cardiac: No recent episodes of chest pain/pressure, no shortness of breath at rest.  No shortness of breath with exertion.  Denies history of atrial fibrillation or irregular heartbeat ? ?Vascular: No history  of rest pain in feet.  No history of claudication.  No history of non-healing ulcer, No history of DVT  ? ?Pulmonary: No home oxygen, no productive cough, no hemoptysis,  No asthma or wheezing ? ?Musculoskeletal:  '[ ]'$  Arthritis, '[ ]'$  Low back pain,  '[ ]'$  Joint pain ? ?Hematologic:No history of hypercoagulable state.  No history of easy bleeding.  No history of anemia ? ?Gastrointestinal: No hematochezia or melena,  No gastroesophageal reflux, no trouble swallowing ? ?Urinary: '[ ]'$  chronic Kidney disease, '[ ]'$  on HD - '[ ]'$  MWF or '[ ]'$  TTHS, '[ ]'$  Burning with urination, '[ ]'$  Frequent urination, '[ ]'$  Difficulty urinating;  ? ?Skin: No rashes ? ?Psychological: No history of anxiety,  No history of depression ? ? ?Physical Examination ? ?Vitals:  ? 12/20/21 0831 12/20/21 5366  ?BP: 132/64 135/68  ?Pulse: (!) 54 (!) 56  ?Temp: 98 ?F (36.7 ?C)   ?Weight: 146 lb 8 oz (66.5 kg)   ?Height: '5\' 5"'$  (1.651 m)   ? ? ?Body mass index is 24.38 kg/m?. ? ?General:  Alert and oriented, no acute distress ?HEENT: Normal ?Neck: No bruit or JVD ?Pulmonary: Clear to auscultation bilaterally ?Cardiac: Regular Rate and Rhythm without murmur ?Gastrointestinal: Soft, non-tender, non-distended, no mass, no scars ?Skin: No rash ?Extremity Pulses:  2+ radial, brachial, femoral, dorsalis pedis, posterior tibial pulses bilaterally ?Musculoskeletal: No deformity or edema  ?Neurologic: Upper and lower extremity motor 5/5 and symmetric ? ?DATA:  ?   ?Right Carotid Findings:  ?+----------+--------+--------+--------+------------------+--------+  ?          PSV cm/sEDV cm/sStenosisPlaque DescriptionComments  ?+----------+--------+--------+--------+------------------+--------+  ?CCA Prox  58      8                                           ?+----------+--------+--------+--------+------------------+--------+  ?CCA Mid   81      12                                          ?+----------+--------+--------+--------+------------------+--------+  ?CCA  Distal67      14      <50%    diffuse                     ?+----------+--------+--------+--------+------------------+--------+  ?  ICA Prox  79      20      1-39%   smooth                      ?+----------+--------+--------+--------+------------------+--------+  ?ICA Mid   90      27                                          ?+----------+--------+--------+--------+------------------+--------+  ?ICA Distal105     27                                          ?+----------+--------+--------+--------+------------------+--------+  ?ECA       137     0       >50%    irregular                   ?+----------+--------+--------+--------+------------------+--------+  ? ?+----------+--------+-------+----------------+-------------------+  ?          PSV cm/sEDV cmsDescribe        Arm Pressure (mmHG)  ?+----------+--------+-------+----------------+-------------------+  ?[id number]      0      Multiphasic, WNL                     ?+----------+--------+-------+----------------+-------------------+  ? ?+---------+--------+--+--------+--+---------+  ?VertebralPSV cm/s48EDV cm/s13Antegrade  ?+---------+--------+--+--------+--+---------+  ? ?   ? ?Left Carotid Findings:  ?+----------+--------+--------+--------+------------------+--------+  ?          PSV cm/sEDV cm/sStenosisPlaque DescriptionComments  ?+----------+--------+--------+--------+------------------+--------+  ?CCA Prox  53      11                                          ?+----------+--------+--------+--------+------------------+--------+  ?CCA Mid   81      16      <50%    diffuse                     ?+----------+--------+--------+--------+------------------+--------+  ?CCA Distal73      19                                          ?+----------+--------+--------+--------+------------------+--------+  ?ICA Prox  183     49      40-59%  calcific                     ?+----------+--------+--------+--------+------------------+--------+  ?ICA Mid   154     26                                          ?+----------+--------+--------+--------+------------------+--------+  ?ICA Distal135     22                                          ?+----------+--------+--------+--------+------------------+--------+  ?ECA

## 2021-12-20 ENCOUNTER — Other Ambulatory Visit: Payer: Self-pay

## 2021-12-20 ENCOUNTER — Encounter: Payer: Self-pay | Admitting: Physician Assistant

## 2021-12-20 ENCOUNTER — Ambulatory Visit (HOSPITAL_COMMUNITY)
Admission: RE | Admit: 2021-12-20 | Discharge: 2021-12-20 | Disposition: A | Discharge status: Home / Self Care | Payer: Medicare Other | Source: Ambulatory Visit | Attending: Vascular Surgery | Admitting: Vascular Surgery

## 2021-12-20 ENCOUNTER — Ambulatory Visit (INDEPENDENT_AMBULATORY_CARE_PROVIDER_SITE_OTHER): Payer: Medicare Other | Admitting: Physician Assistant

## 2021-12-20 VITALS — BP 135/68 | HR 56 | Temp 98.0°F | Ht 65.0 in | Wt 146.5 lb

## 2021-12-20 DIAGNOSIS — I6523 Occlusion and stenosis of bilateral carotid arteries: Secondary | ICD-10-CM

## 2021-12-20 DIAGNOSIS — I714 Abdominal aortic aneurysm, without rupture, unspecified: Secondary | ICD-10-CM

## 2022-01-09 DIAGNOSIS — Z006 Encounter for examination for normal comparison and control in clinical research program: Secondary | ICD-10-CM

## 2022-01-09 NOTE — Research (Signed)
Review of medical records for Vesalius study completed. Patient not wanting to start back on IP at this time. Next follow up due in July. Updated in the [facility] for Vesalius ?

## 2022-02-01 DIAGNOSIS — L821 Other seborrheic keratosis: Secondary | ICD-10-CM | POA: Diagnosis not present

## 2022-02-21 ENCOUNTER — Encounter: Payer: Self-pay | Admitting: Cardiology

## 2022-02-22 ENCOUNTER — Other Ambulatory Visit: Payer: Self-pay

## 2022-02-22 DIAGNOSIS — I10 Essential (primary) hypertension: Secondary | ICD-10-CM

## 2022-02-22 MED ORDER — LOSARTAN POTASSIUM 100 MG PO TABS: 100.0000 mg | ORAL_TABLET | Freq: Every day | ORAL | 3 refills | Status: AC

## 2022-03-27 DIAGNOSIS — E039 Hypothyroidism, unspecified: Secondary | ICD-10-CM | POA: Diagnosis not present

## 2022-03-27 DIAGNOSIS — I10 Essential (primary) hypertension: Secondary | ICD-10-CM | POA: Diagnosis not present

## 2022-03-27 DIAGNOSIS — E782 Mixed hyperlipidemia: Secondary | ICD-10-CM | POA: Diagnosis not present

## 2022-03-27 DIAGNOSIS — R739 Hyperglycemia, unspecified: Secondary | ICD-10-CM | POA: Diagnosis not present

## 2022-03-29 DIAGNOSIS — Z1159 Encounter for screening for other viral diseases: Secondary | ICD-10-CM | POA: Diagnosis not present

## 2022-03-29 DIAGNOSIS — Z79899 Other long term (current) drug therapy: Secondary | ICD-10-CM | POA: Diagnosis not present

## 2022-03-29 DIAGNOSIS — E039 Hypothyroidism, unspecified: Secondary | ICD-10-CM | POA: Diagnosis not present

## 2022-03-29 DIAGNOSIS — I10 Essential (primary) hypertension: Secondary | ICD-10-CM | POA: Diagnosis not present

## 2022-03-29 DIAGNOSIS — E782 Mixed hyperlipidemia: Secondary | ICD-10-CM | POA: Diagnosis not present

## 2022-03-29 DIAGNOSIS — R768 Other specified abnormal immunological findings in serum: Secondary | ICD-10-CM | POA: Diagnosis not present

## 2022-04-25 DIAGNOSIS — Z006 Encounter for examination for normal comparison and control in clinical research program: Secondary | ICD-10-CM

## 2022-04-25 NOTE — Research (Signed)
Called patient to follow up with Vesalius trial. She denies any adverse events or recent hospitalizations. Reviewed all concomitant medications and no changes noted at this time. She stated that the doctor is just going to monitor her abdominal aortic aneurysm. Asked patient if she wanted to start back on the IP and she said she hasn't really thought much about it. States she will think things over and give me a call back.  Current Outpatient Medications:    amLODipine (NORVASC) 5 MG tablet, Take 1 tablet (5 mg total) by mouth daily., Disp: 90 tablet, Rfl: 3   losartan (COZAAR) 100 MG tablet, Take 1 tablet (100 mg total) by mouth daily., Disp: 90 tablet, Rfl: 3   SYNTHROID 75 MCG tablet, Take 1 tablet (75 mcg total) by mouth daily before breakfast., Disp: 90 tablet, Rfl: 3   tiZANidine (ZANAFLEX) 4 MG tablet, Take 1 tablet (4 mg total) by mouth every 6 (six) hours as needed for muscle spasms., Disp: 30 tablet, Rfl: 0  Current Facility-Administered Medications:    Study - VESALIUS - evolocumab (AMG 145) 140 mg/mL or placebo SQ injection (PI-Hilty), 140 mg, Subcutaneous, Q14 Days, Hilty, Nadean Corwin, MD

## 2022-05-09 DIAGNOSIS — M10079 Idiopathic gout, unspecified ankle and foot: Secondary | ICD-10-CM | POA: Diagnosis not present

## 2022-06-01 DIAGNOSIS — Z23 Encounter for immunization: Secondary | ICD-10-CM | POA: Diagnosis not present

## 2022-06-06 ENCOUNTER — Other Ambulatory Visit: Payer: Medicare Other

## 2022-06-18 DIAGNOSIS — K824 Cholesterolosis of gallbladder: Secondary | ICD-10-CM | POA: Diagnosis not present

## 2022-08-30 DIAGNOSIS — M546 Pain in thoracic spine: Secondary | ICD-10-CM | POA: Diagnosis not present

## 2022-08-30 DIAGNOSIS — R109 Unspecified abdominal pain: Secondary | ICD-10-CM | POA: Diagnosis not present

## 2022-08-31 DIAGNOSIS — M546 Pain in thoracic spine: Secondary | ICD-10-CM | POA: Diagnosis not present

## 2022-09-05 IMAGING — DX DG THORACIC SPINE 2V
3 series · 3 of 3 positions shown · non-contrast
Comparison: None.

CLINICAL DATA: 64 year-old female c/o LEFT shoulder pain near
rhomboid muscle that radiates into upper back between shoulder
blades x 4 weeks. Pt does indoor rowing and thinks it may be from
that. No sx.

EXAM:
THORACIC SPINE 2 VIEWS

[t-spine ap]
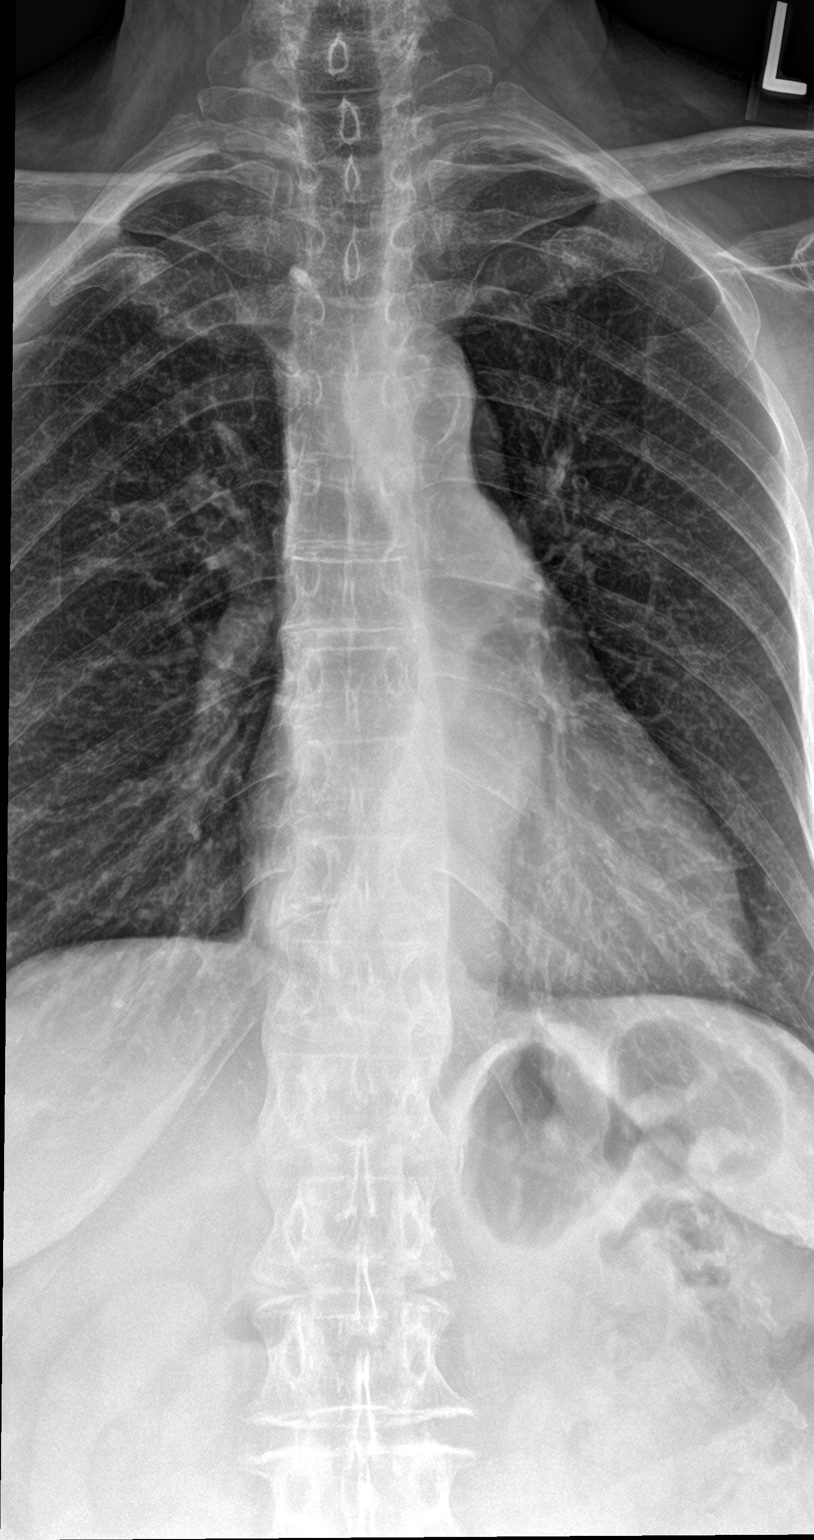

[t-spine lat]
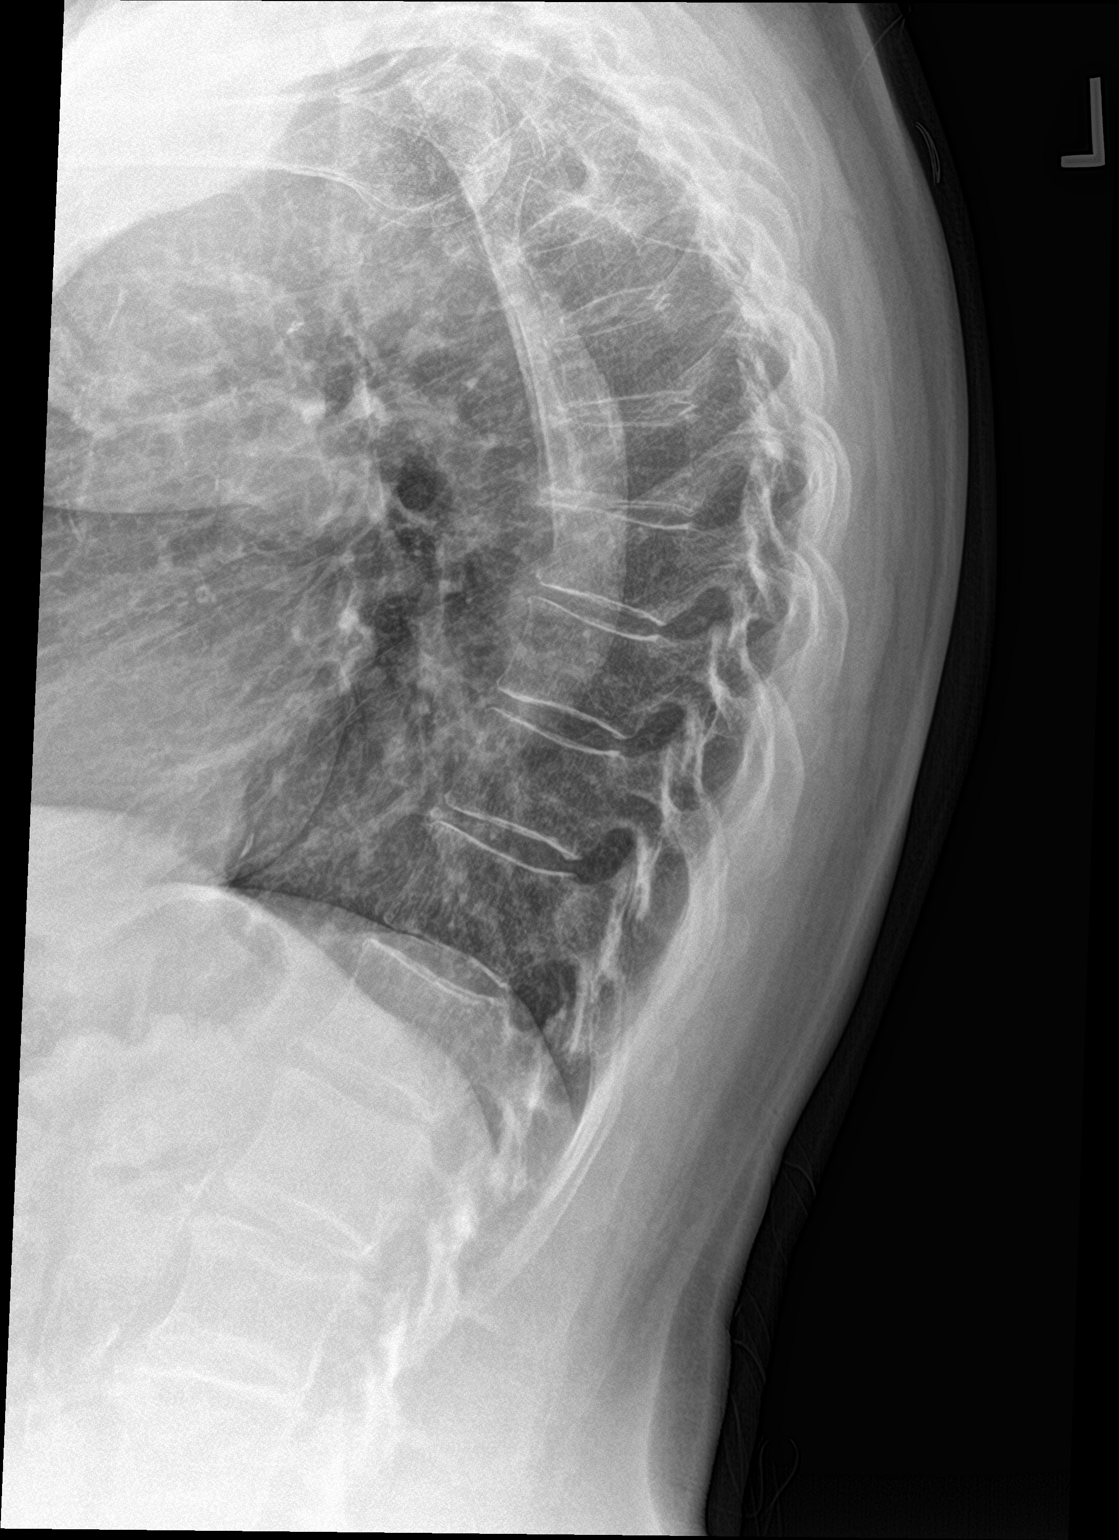

[t-spine swimmers]
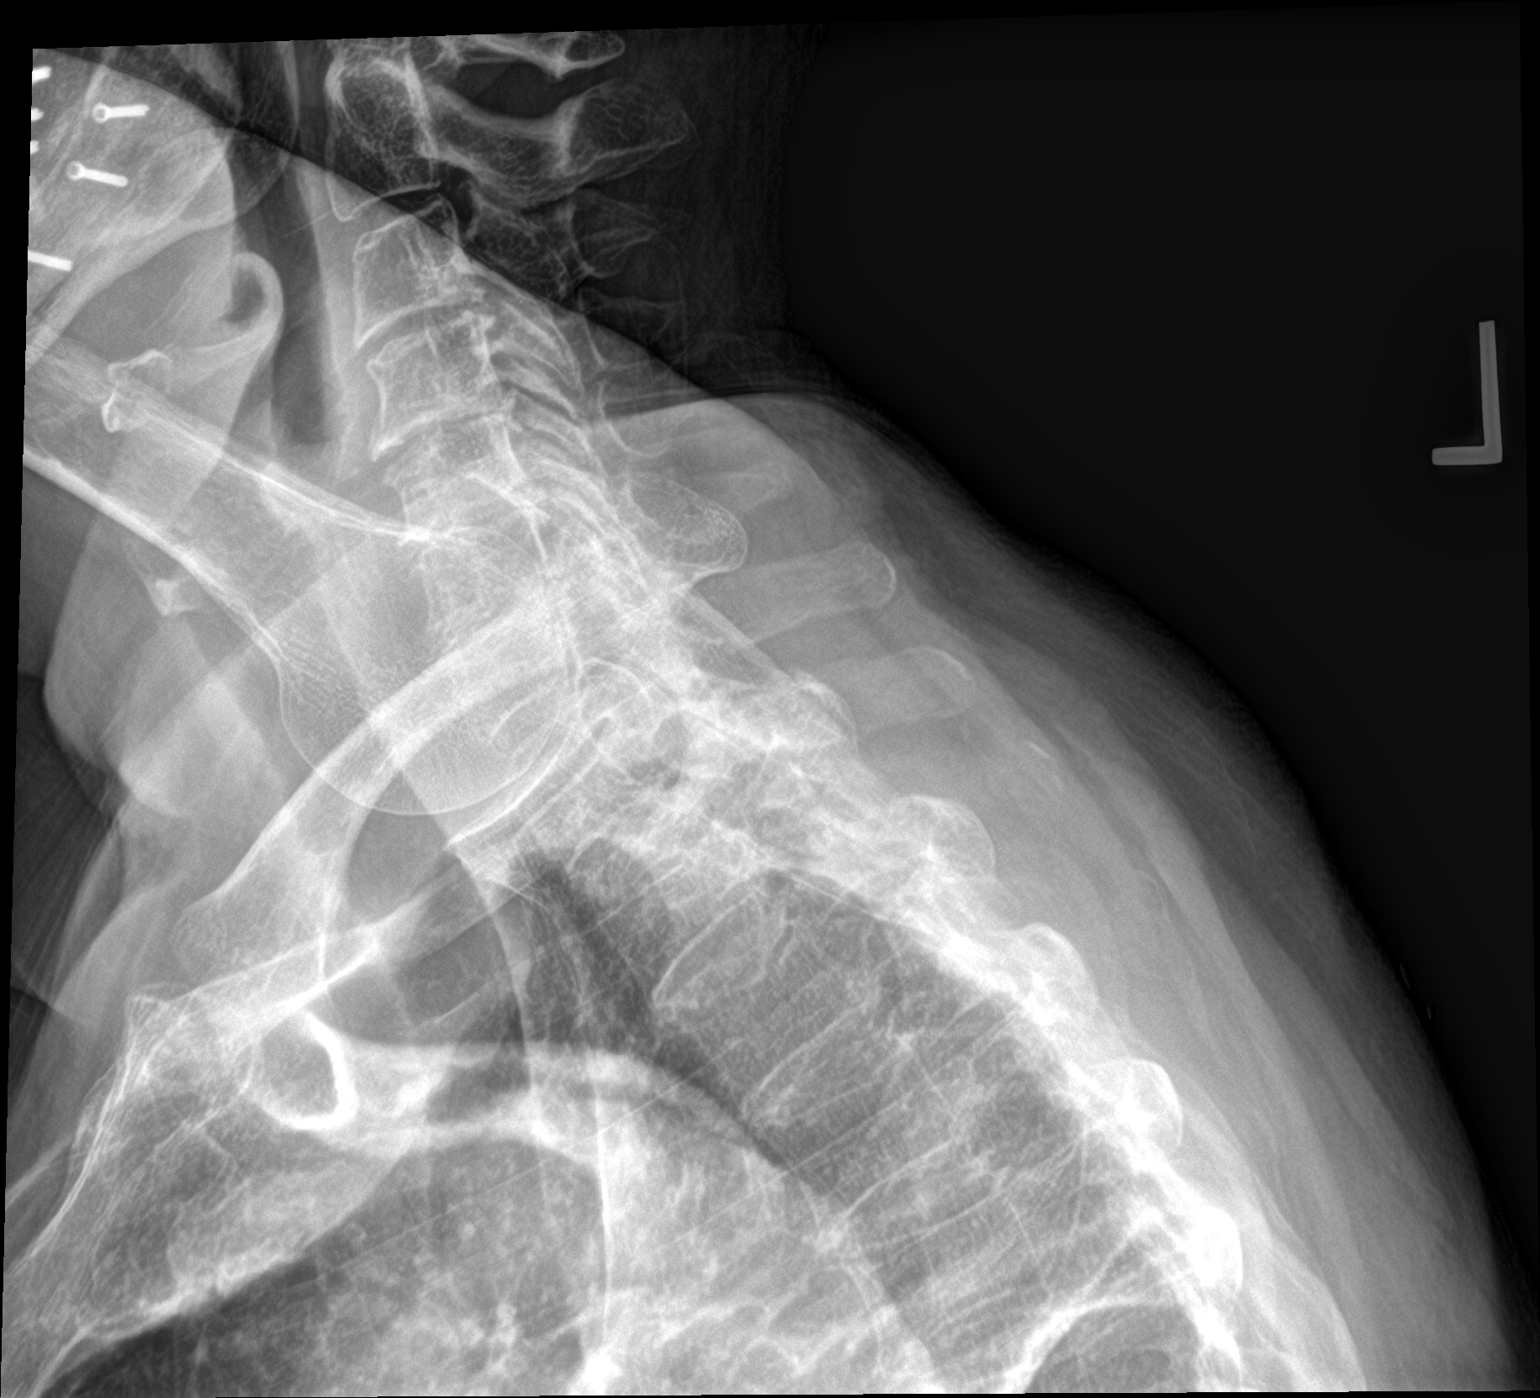

[3 of 3 positions shown; findings below may reference images not displayed]

FINDINGS: No fracture, bone lesion or spondylolisthesis.

Mild disc degenerative changes along the midthoracic spine reflected
loss of disc height with small endplate osteophytes.

Soft tissues are unremarkable.
IMPRESSION: 1. No fracture or acute finding.  No spondylolisthesis.
2. Mild midthoracic spine disc degenerative change.

## 2022-09-26 ENCOUNTER — Telehealth: Payer: Self-pay

## 2022-09-26 DIAGNOSIS — D225 Melanocytic nevi of trunk: Secondary | ICD-10-CM | POA: Diagnosis not present

## 2022-09-26 DIAGNOSIS — L814 Other melanin hyperpigmentation: Secondary | ICD-10-CM | POA: Diagnosis not present

## 2022-09-26 DIAGNOSIS — L578 Other skin changes due to chronic exposure to nonionizing radiation: Secondary | ICD-10-CM | POA: Diagnosis not present

## 2022-09-26 DIAGNOSIS — L821 Other seborrheic keratosis: Secondary | ICD-10-CM | POA: Diagnosis not present

## 2022-09-26 NOTE — Telephone Encounter (Addendum)
Called pt to follow up on Vesalius research trial. Pt stated she has been doing well and not having any problems. She stated her last cholesterol was very good. I asked her if she was still interested in restarting the IP for the trial and she said she wanted to talk to Dr.Crenshaw first and see what he thinks about it and then will decide if she wants to continue.          Non-Fatal Potential Endpoint Assessment Yes  No   Has the subject experienced/undergone any of the following since the last visit/contact?   []   [x]    Any Coronary Artery Revascularization/Cerebrovascular Revascularization/ Peripheral Artery Revascularization/Amputation Procedure   []   [x]    Myocardial Infarction []   [x]    Stroke   []   x  Provide the date for the non-fatal Potential Endpoints status:   []   [x] 

## 2022-11-21 NOTE — Progress Notes (Signed)
HPI:  FU cerebrovascular disease. Echocardiogram in 2004 showed normal LV function. Nuclear study January 2017 showed ejection fraction 58% and normal perfusion. ABIs March 2021 normal. Abdominal ultrasound December 2022 showed abdominal aortic aneurysm 3.3 cm.  Aortic atherosclerosis noted. Carotid Dopplers March 2023 showed 1 to 39% right, 40 to 59% left stenosis.  Since last seen, the patient denies any dyspnea on exertion, orthopnea, PND, pedal edema, palpitations, syncope or chest pain.   Current Outpatient Medications  Medication Sig Dispense Refill   amLODipine (NORVASC) 5 MG tablet Take 1 tablet (5 mg total) by mouth daily. 90 tablet 3   losartan (COZAAR) 100 MG tablet Take 1 tablet (100 mg total) by mouth daily. 90 tablet 3   predniSONE (DELTASONE) 20 MG tablet Take 20 mg by mouth 2 (two) times daily with a meal.     SYNTHROID 75 MCG tablet Take 1 tablet (75 mcg total) by mouth daily before breakfast. 90 tablet 3   tiZANidine (ZANAFLEX) 4 MG tablet Take 1 tablet (4 mg total) by mouth every 6 (six) hours as needed for muscle spasms. 30 tablet 0   Current Facility-Administered Medications  Medication Dose Route Frequency Provider Last Rate Last Admin   Study - VESALIUS - evolocumab (AMG 145) 140 mg/mL or placebo SQ injection (PI-Hilty)  140 mg Subcutaneous Q14 Days Hilty, Nadean Corwin, MD         Past Medical History:  Diagnosis Date   AAA (abdominal aortic aneurysm) ([place])    Arthritis    Bursitis of hip 08/02/2015   Carotid disease, bilateral ([place])    [place] 10/15: R 40-59%, L 50-69%, stable for years   Elevated sed rate    remote history of myelosuppressive disorder   Grave's disease    s/p I-131 ablation 1990s   Hyperlipidemia    Hypertension    HYPOTHYROIDISM    Left knee pain 03/26/2014   Personal history of colonic adenoma 11/30/2002   Preventative health care 09/04/2015   Thrombophlebitis 10/16/2016   Vitamin D deficiency     Past Surgical History:  Procedure  Laterality Date   COLONOSCOPY     Jaw surgery  2001   OOPHORECTOMY Bilateral    POLYPECTOMY     VAGINAL HYSTERECTOMY  2009   vaginal hysterectomy    Social History   Socioeconomic History   Marital status: Married    Spouse name: Not on file   Number of children: Not on file   Years of education: Not on file   Highest education level: Not on file  Occupational History    Comment: [employer]  Tobacco Use   Smoking status: Former    Packs/day: 0.50    Years: 12.00    Total pack years: 6.00    Types: Cigarettes    Quit date: 02/06/1993    Years since quitting: 29.8   Smokeless tobacco: Never  Vaping Use   Vaping Use: Never used  Substance and Sexual Activity   Alcohol use: No   Drug use: No   Sexual activity: Yes  Other Topics Concern   Not on file  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   Financial Resource Strain: Not on file  Food Insecurity: Not on file  Transportation Needs: Not on file  Physical Activity: Not on file  Stress: Not on file  Social Connections: Not on file  Intimate Partner Violence: Not on file    Family History  Problem Relation Age of Onset   Hypertension Mother    Arthritis Mother    Hyperlipidemia Mother    Stroke Mother        1st age 44, then recurrent 78   Atrial fibrillation Mother    Epilepsy Mother    Arthritis Father    Breast cancer Sister    Cancer Sister        Recurrent Breast CA   Atrial fibrillation Brother    Von Willebrand disease Daughter    Other Daughter        POTS   Factor V Leiden deficiency Daughter    Breast cancer Other    Drug abuse Maternal Aunt    Diabetes Neg Hx    Heart attack Neg Hx    Colon cancer Neg Hx    Esophageal cancer Neg Hx    Rectal cancer Neg Hx    Stomach cancer Neg Hx     ROS: no fevers or chills, productive cough, hemoptysis, dysphasia, odynophagia, melena, hematochezia, dysuria, hematuria, rash, seizure activity, orthopnea,  PND, pedal edema, claudication. Remaining systems are negative.  Physical Exam: Well-developed well-nourished in no acute distress.  Skin is warm and dry.  HEENT is normal.  Neck is supple.  Chest is clear to auscultation with normal expansion.  Cardiovascular exam is regular rate and rhythm.  Abdominal exam nontender or distended. No masses palpated. Extremities show no edema. neuro grossly intact  ECG-normal sinus rhythm at a rate of 77, no ST changes.  Personally reviewed  A/P  1 carotid artery disease-patient is followed by vascular surgery.  Plan follow-up carotid Dopplers March 2024.  2 abdominal aortic aneurysm-patient is also followed by vascular surgery for this issue.  She needs follow-up abdominal ultrasound scheduled.  3 hypertension-blood pressure is mildly elevated.  However she follows this at home and it is controlled.  Continue present medications and follow.  4 hyperlipidemia-intolerant to statins.  Will refer to lipid clinic for PCSK9 inhibitor.  Kirk Ruths, MD

## 2022-11-28 DIAGNOSIS — Z006 Encounter for examination for normal comparison and control in clinical research program: Secondary | ICD-10-CM

## 2022-11-28 NOTE — Research (Addendum)
Called pt today to see how she was doing. Pt stated she was doing fine. No ED or urgent care visits. She has no complaints at this time. She is going to see Dr.Crenshaw next week and will call me and let me know if she wants to continue with the study. I have given her the number to the office and my desk.          Non-Fatal Potential Endpoint Assessment Yes  No   Has the subject experienced/undergone any of the following since the last visit/contact?   []   [x]    Any Coronary Artery Revascularization/Cerebrovascular Revascularization/ Peripheral Artery Revascularization/Amputation Procedure   []   [x]    Myocardial Infarction []   [x]    Stroke   []   x  Provide the date for the non-fatal Potential Endpoints status:   []   [x] 

## 2022-12-05 ENCOUNTER — Encounter: Payer: Self-pay | Admitting: Cardiology

## 2022-12-05 ENCOUNTER — Ambulatory Visit (INDEPENDENT_AMBULATORY_CARE_PROVIDER_SITE_OTHER): Payer: Medicare Other | Admitting: Cardiology

## 2022-12-05 VITALS — BP 142/75 | HR 77 | Ht 65.0 in | Wt 147.1 lb

## 2022-12-05 DIAGNOSIS — I6523 Occlusion and stenosis of bilateral carotid arteries: Secondary | ICD-10-CM | POA: Diagnosis not present

## 2022-12-05 DIAGNOSIS — I10 Essential (primary) hypertension: Secondary | ICD-10-CM

## 2022-12-05 DIAGNOSIS — E7800 Pure hypercholesterolemia, unspecified: Secondary | ICD-10-CM | POA: Diagnosis not present

## 2022-12-05 NOTE — Patient Instructions (Signed)
    Follow-Up: At [facility], you and your health needs are our priority.  As part of our continuing mission to provide you with exceptional heart care, we have created designated Provider Care Teams.  These Care Teams include your primary Cardiologist (physician) and Advanced Practice Providers (APPs -  Physician Assistants and Nurse Practitioners) who all work together to provide you with the care you need, when you need it.  We recommend signing up for the patient portal called "MyChart".  Sign up information is provided on this After Visit Summary.  MyChart is used to connect with patients for Virtual Visits (Telemedicine).  Patients are able to view lab/test results, encounter notes, upcoming appointments, etc.  Non-urgent messages can be sent to your provider as well.   To learn more about what you can do with MyChart, go to [email or web address].    Your next appointment:   12 month(s)  Provider:   Brian Crenshaw, MD     

## 2022-12-06 ENCOUNTER — Other Ambulatory Visit: Payer: Self-pay | Admitting: *Deleted

## 2022-12-06 DIAGNOSIS — I714 Abdominal aortic aneurysm, without rupture, unspecified: Secondary | ICD-10-CM

## 2022-12-06 DIAGNOSIS — I6523 Occlusion and stenosis of bilateral carotid arteries: Secondary | ICD-10-CM

## 2022-12-10 NOTE — Addendum Note (Signed)
Addended by: Cristopher Estimable on: 12/10/2022 09:09 AM   Modules accepted: Orders

## 2022-12-24 ENCOUNTER — Ambulatory Visit (HOSPITAL_COMMUNITY)
Admission: RE | Admit: 2022-12-24 | Discharge: 2022-12-24 | Disposition: A | Discharge status: Home / Self Care | Payer: Medicare Other | Source: Ambulatory Visit | Attending: Surgery | Admitting: Surgery

## 2022-12-24 ENCOUNTER — Ambulatory Visit (INDEPENDENT_AMBULATORY_CARE_PROVIDER_SITE_OTHER)
Admission: RE | Admit: 2022-12-24 | Discharge: 2022-12-24 | Disposition: A | Discharge status: Home / Self Care | Payer: Medicare Other | Source: Ambulatory Visit | Attending: Surgery | Admitting: Surgery

## 2022-12-24 DIAGNOSIS — I6523 Occlusion and stenosis of bilateral carotid arteries: Secondary | ICD-10-CM

## 2022-12-24 DIAGNOSIS — I714 Abdominal aortic aneurysm, without rupture, unspecified: Secondary | ICD-10-CM | POA: Diagnosis present

## 2022-12-26 ENCOUNTER — Ambulatory Visit (INDEPENDENT_AMBULATORY_CARE_PROVIDER_SITE_OTHER): Payer: Medicare Other | Admitting: Physician Assistant

## 2022-12-26 VITALS — BP 143/76 | HR 80 | Temp 97.1°F | Resp 18 | Ht 65.0 in | Wt 145.6 lb

## 2022-12-26 DIAGNOSIS — I6523 Occlusion and stenosis of bilateral carotid arteries: Secondary | ICD-10-CM

## 2022-12-26 DIAGNOSIS — I714 Abdominal aortic aneurysm, without rupture, unspecified: Secondary | ICD-10-CM

## 2022-12-26 NOTE — Progress Notes (Signed)
Office Note     CC:  follow up Requesting Provider:  Mosie Lukes, MD  HPI: Lindsay Hodges is a 67 y.o. (July 18, 1956) female who presents for surveillance of AAA as well as carotid artery stenosis.  Since last office visit she denies any neurological events including slurring speech, changes in vision, or one-sided weakness.  She has been followed for years for asymptomatic carotid artery stenosis.  She has a statin allergy however has an appointment with the lipid clinic at the end of the month.  She is followed for AAA surveillance.  1 year ago AAA measured 4 cm.  She denies any new or changing abdominal or back pain.  She denies any claudication, rest pain, or tissue changes of bilateral lower extremities.  She is a former smoker.  She likes to vacation in [place] where her sister has a house.   Past Medical History:  Diagnosis Date   AAA (abdominal aortic aneurysm) ([place])    Arthritis    Bursitis of hip 08/02/2015   Carotid disease, bilateral ([place])    [place] 10/15: R 40-59%, L 50-69%, stable for years   Elevated sed rate    remote history of myelosuppressive disorder   Grave's disease    s/p I-131 ablation 1990s   Hyperlipidemia    Hypertension    HYPOTHYROIDISM    Left knee pain 03/26/2014   Personal history of colonic adenoma 11/30/2002   Preventative health care 09/04/2015   Thrombophlebitis 10/16/2016   Vitamin D deficiency     Past Surgical History:  Procedure Laterality Date   COLONOSCOPY     Jaw surgery  2001   OOPHORECTOMY Bilateral    POLYPECTOMY     VAGINAL HYSTERECTOMY  2009   vaginal hysterectomy    Social History   Socioeconomic History   Marital status: Married    Spouse name: Not on file   Number of children: Not on file   Years of education: Not on file   Highest education level: Not on file  Occupational History    Comment: [employer]  Tobacco Use   Smoking status: Former    Packs/day: 0.50    Years: 12.00     Additional pack years: 0.00    Total pack years: 6.00    Types: Cigarettes    Quit date: 02/06/1993    Years since quitting: 29.9   Smokeless tobacco: Never  Vaping Use   Vaping Use: Never used  Substance and Sexual Activity   Alcohol use: No   Drug use: No   Sexual activity: Yes  Other Topics Concern   Not on file  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   Financial Resource Strain: Not on file  Food Insecurity: Not on file  Transportation Needs: Not on file  Physical Activity: Not on file  Stress: Not on file  Social Connections: Not on file  Intimate Partner Violence: Not on file    Family History  Problem Relation Age of Onset   Hypertension Mother    Arthritis Mother    Hyperlipidemia Mother    Stroke Mother        1st age 24, then recurrent 75   Atrial fibrillation Mother    Epilepsy Mother    Arthritis Father    Breast cancer Sister    Cancer Sister        Recurrent Breast CA   Atrial fibrillation Brother  Von Willebrand disease Daughter    Other Daughter        POTS   Factor V Leiden deficiency Daughter    Breast cancer Other    Drug abuse Maternal Aunt    Diabetes Neg Hx    Heart attack Neg Hx    Colon cancer Neg Hx    Esophageal cancer Neg Hx    Rectal cancer Neg Hx    Stomach cancer Neg Hx     Current Outpatient Medications  Medication Sig Dispense Refill   losartan (COZAAR) 100 MG tablet Take 1 tablet (100 mg total) by mouth daily. 90 tablet 3   SYNTHROID 75 MCG tablet Take 1 tablet (75 mcg total) by mouth daily before breakfast. 90 tablet 3   tiZANidine (ZANAFLEX) 4 MG tablet Take 1 tablet (4 mg total) by mouth every 6 (six) hours as needed for muscle spasms. 30 tablet 0   amLODipine (NORVASC) 5 MG tablet Take 1 tablet (5 mg total) by mouth daily. 90 tablet 3   predniSONE (DELTASONE) 20 MG tablet Take 20 mg by mouth 2 (two) times daily with a meal.     Current Facility-Administered Medications   Medication Dose Route Frequency Provider Last Rate Last Admin   Study - VESALIUS - evolocumab (AMG 145) 140 mg/mL or placebo SQ injection (PI-Hilty)  140 mg Subcutaneous Q14 Days Hilty, Nadean Corwin, MD        Allergies  Allergen Reactions   Penicillins Swelling    REACTION: swelling/dyspnea REACTION: swelling/dyspnea REACTION: swelling/dyspnea   Statins Other (See Comments)    Myalgias on several statins     REVIEW OF SYSTEMS:   [X]  denotes positive finding, [ ]  denotes negative finding Cardiac  Comments:  Chest pain or chest pressure:    Shortness of breath upon exertion:    Short of breath when lying flat:    Irregular heart rhythm:        Vascular    Pain in calf, thigh, or hip brought on by ambulation:    Pain in feet at night that wakes you up from your sleep:     Blood clot in your veins:    Leg swelling:         Pulmonary    Oxygen at home:    Productive cough:     Wheezing:         Neurologic    Sudden weakness in arms or legs:     Sudden numbness in arms or legs:     Sudden onset of difficulty speaking or slurred speech:    Temporary loss of vision in one eye:     Problems with dizziness:         Gastrointestinal    Blood in stool:     Vomited blood:         Genitourinary    Burning when urinating:     Blood in urine:        Psychiatric    Major depression:         Hematologic    Bleeding problems:    Problems with blood clotting too easily:        Skin    Rashes or ulcers:        Constitutional    Fever or chills:      PHYSICAL EXAMINATION:  Vitals:   12/26/22 1010 12/26/22 1013  BP: (!) 148/70 (!) 143/76  Pulse: 80   Resp: 18   Temp: (!) 97.1 F (36.2 C)  TempSrc: Oral   SpO2: 100%   Weight: 145 lb 9.6 oz (66 kg)   Height: 5\' 5"  (1.651 m)     General:  WDWN in NAD; vital signs documented above Gait: Not observed HENT: WNL, normocephalic Pulmonary: normal non-labored breathing  Cardiac: regular HR Abdomen: soft, NT, no  masses Skin: without rashes Vascular Exam/Pulses: 2+ R DP; 1+ L DP Extremities: without ischemic changes, without Gangrene , without cellulitis; without open wounds;  Musculoskeletal: no muscle wasting or atrophy  Neurologic: A&O X 3; CN grossly intact Psychiatric:  The pt has Normal affect.   Non-Invasive Vascular Imaging:   4 cm AAA  Right ICA 1 to 39% stenosis Left ICA 40 to 59% stenosis    ASSESSMENT/PLAN:: 67 y.o. female here for follow up for surveillance of carotid artery stenosis and AAA  -AAA duplex unchanged demonstrating 4 cm AAA.  No indication for repair at this time.  We will continue surveillance until AAA approaches 5 cm -Carotid duplex also unchanged.  She has 1 to 39% stenosis of the right ICA and 40 to 59% stenosis of the left ICA.  Left ICA stenosis remains asymptomatic.  No indication for revascularization currently. -We will repeat AAA and carotid duplex in 1 year.  Patient will call/return office sooner with any questions or concerns.   Dagoberto Ligas, PA-C [facility] [phone]  Clinic MD:   Scot Dock on call

## 2022-12-30 IMAGING — DX DG THORACIC SPINE 2V
3 series · 3 of 3 positions shown · non-contrast
Comparison: August 18, 2020

CLINICAL DATA: Thoracic back pain.

EXAM:
THORACIC SPINE 2 VIEWS

[t-spine ap]
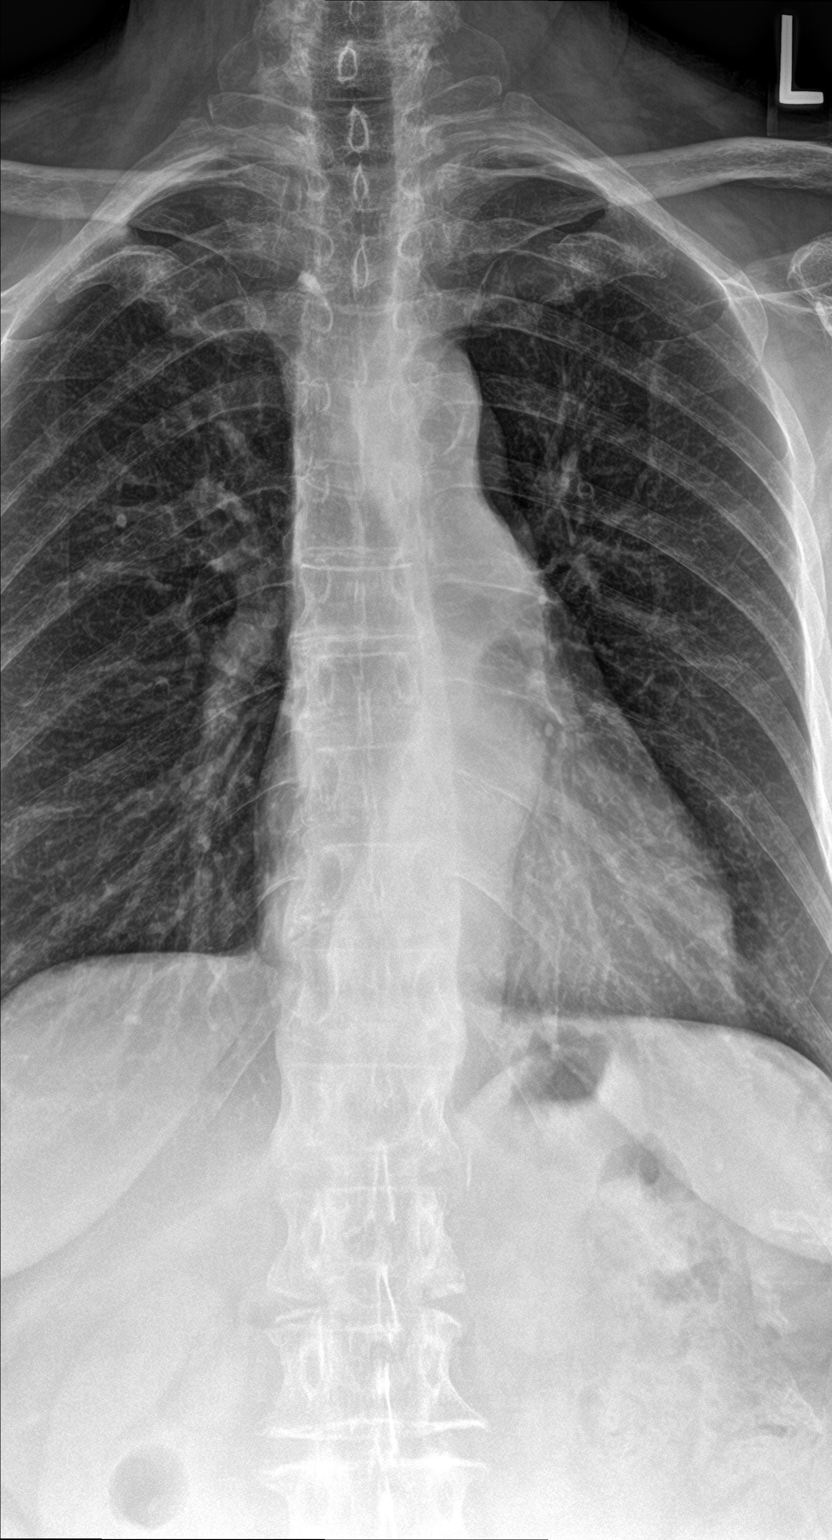

[t-spine lat]
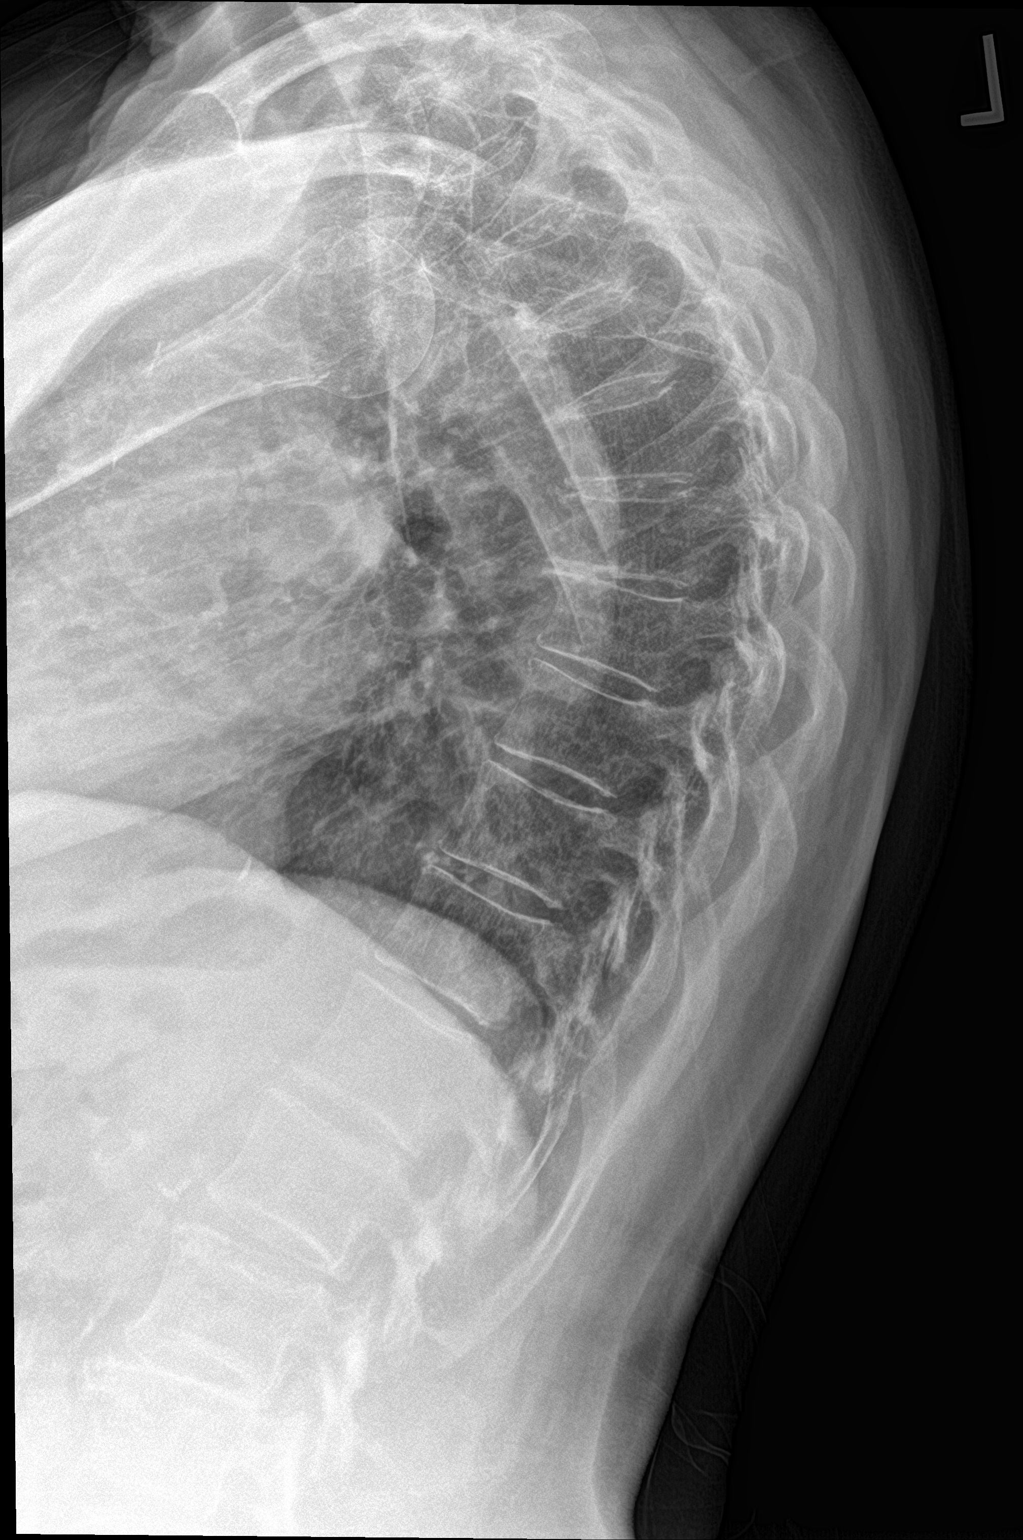

[t-spine swimmers]
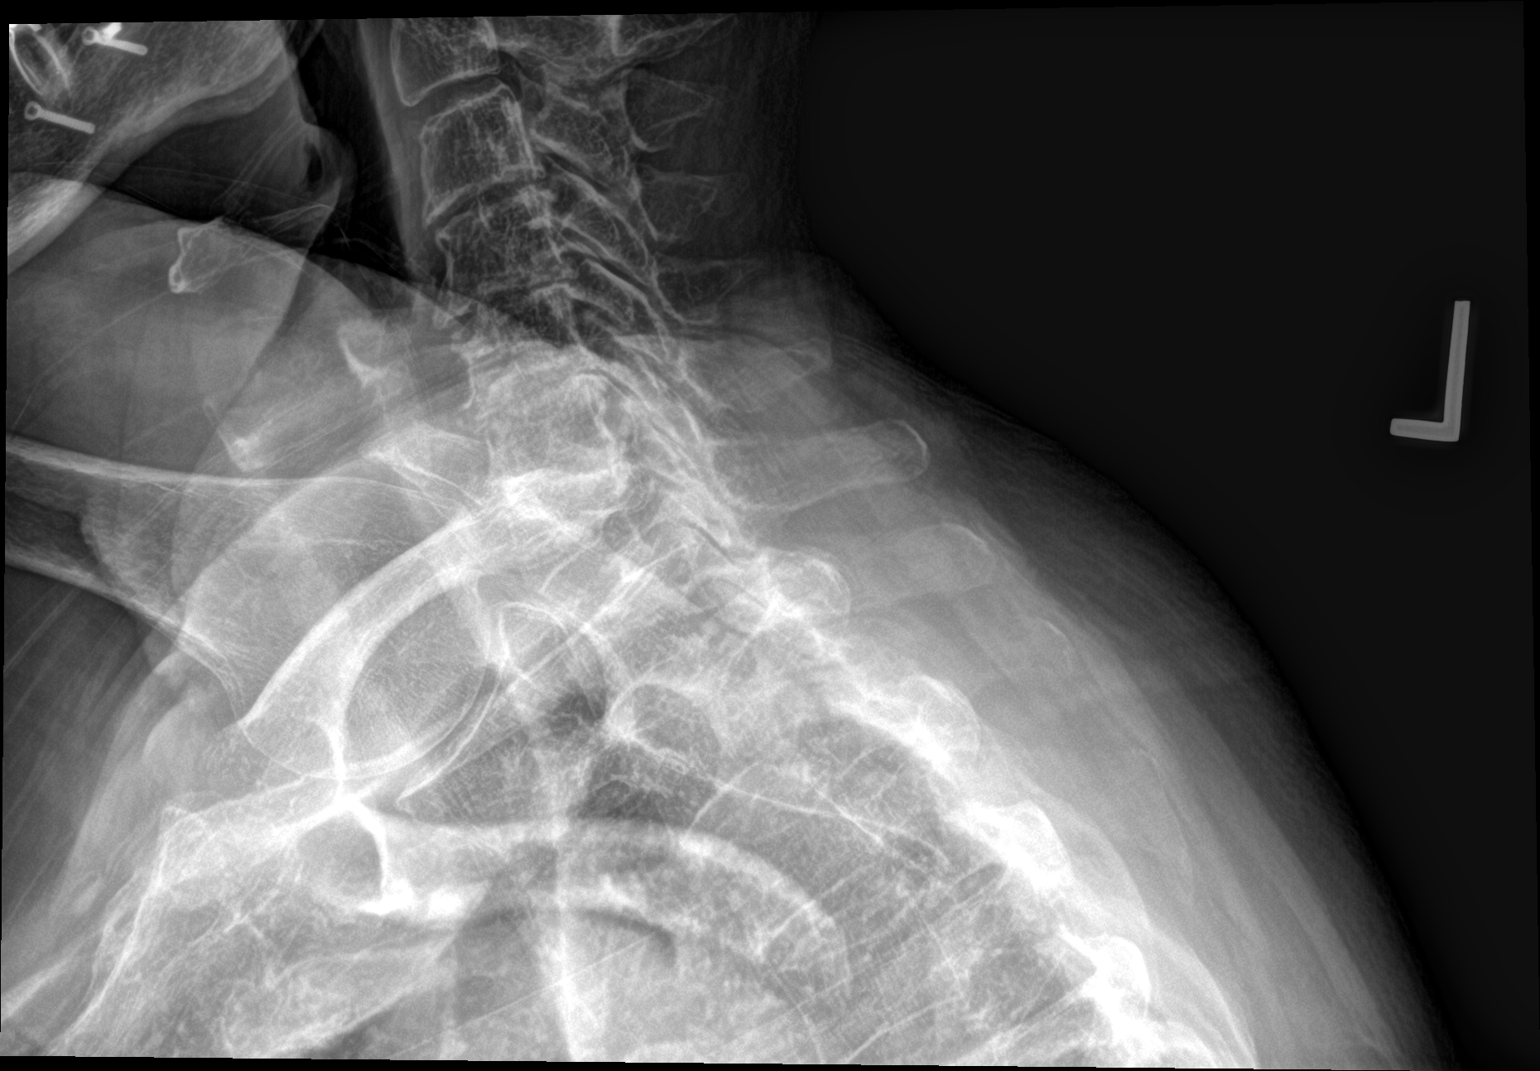

[3 of 3 positions shown; findings below may reference images not displayed]

FINDINGS: There is no acute osseous abnormality. Mild degenerative changes are
noted of the thoracic spine, similar to prior study. Aortic
calcifications are noted.
IMPRESSION: 1. No acute osseous abnormality.
2. Similar degenerative changes of the thoracic spine when compared
to prior study. Given the patient's persistent symptoms, consider
further evaluation with cross-sectional imaging.
3.  Aortic Atherosclerosis ([id number]-AF8.8).

## 2023-01-09 ENCOUNTER — Ambulatory Visit: Payer: Medicare Other

## 2023-01-21 ENCOUNTER — Encounter: Payer: Self-pay | Admitting: Pharmacist Clinician (PhC)/ Clinical Pharmacy Specialist

## 2023-01-21 ENCOUNTER — Ambulatory Visit
Payer: Medicare Other | Attending: Cardiovascular Disease | Admitting: Pharmacist Clinician (PhC)/ Clinical Pharmacy Specialist

## 2023-01-21 DIAGNOSIS — E785 Hyperlipidemia, unspecified: Secondary | ICD-10-CM | POA: Insufficient documentation

## 2023-01-21 NOTE — Progress Notes (Signed)
Office Visit    Patient Name: Lindsay Hodges Date of Encounter: 01/21/2023  Primary Care Provider:  Montez Hageman, DO Primary Cardiologist:  Olga Millers, MD  Chief Complaint    Hyperlipidemia   Significant Past Medical History   CAD Carotid doppler show 50-69% left stenosis, 40-59% right, followed by Dr. Pascal Lux  HTN On amlodipine, losartan  hypothyroid Graves' disease, I-131 ablation, now on levothyroxine  Vit D deficiency 1/24 level at 12     Allergies  Allergen Reactions   Penicillins Swelling    REACTION: swelling/dyspnea REACTION: swelling/dyspnea REACTION: swelling/dyspnea   Statins Other (See Comments)    Myalgias on several statins    History of Present Illness    Lindsay Hodges is a 67 y.o. female patient of Dr Jens Som, in the office today to discuss options for cholesterol management.  She was enrolled in the VESALIUS trial back in 2020 (effects of Evolocumab in patients at high CV risk w/o prior MI or stroke), but has not been taking the medications at this time.    Insurance Carrier: [employer]  LDL Cholesterol goal:  LDL < 70  Current Medications:  none  (was enrolled in VESALIUS trial; had some issues with increased liver enzymes, currently not taking study medication and notes she has dropped the study.    Previously tried:  atorvastatin, pravastatin, rosuvastatin - myalgias after 2-3 weeks              Cost issues previously with ezetimibe - $200 with insurance for 90 days  Family Hx:  don't know about father family; mother died at 21 after stroke; 1 sister deceased from breast cancer complications; 3 daughters, one with high cholesterol   Social Hx: Tobacco: quit over 30 years ago Alcohol: none    Diet:    mostly home cooked meals, more chicken and pork, plenty of vegetables and only rare fried foods.    Exercise: walks 3-4 miles per day, indoor rowing also  Adherence Assessment  Do you ever forget to take your medication?  Yes No  Do you ever skip doses due to side effects? Yes No  Do you have trouble affording your medicines? Yes No  Are you ever unable to pick up your medication due to transportation difficulties? Yes No  Do you ever stop taking your medications because you don't believe they are helping? Yes No    Accessory Clinical Findings   10/05/22  (in Care Everywhere)  TC 233, TG 94, HDL 74, LDL 147  Lab Results  Component Value Date   CHOL 147 05/18/2021   HDL 68.10 05/18/2021   LDLCALC 62 05/18/2021   TRIG 81.0 05/18/2021   CHOLHDL 2 05/18/2021    Lab Results  Component Value Date   ALT 91 (H) 06/19/2021   AST 64 (H) 06/19/2021   ALKPHOS 79 06/19/2021   BILITOT 0.5 06/19/2021   Lab Results  Component Value Date   CREATININE 0.68 09/06/2021   BUN 16 09/06/2021   NA 138 09/06/2021   K 4.1 09/06/2021   CL 107 09/06/2021   CO2 24 09/06/2021   Lab Results  Component Value Date   HGBA1C 6.0 05/18/2021    Home Medications    Current Outpatient Medications  Medication Sig Dispense Refill   amLODipine (NORVASC) 5 MG tablet Take 1 tablet (5 mg total) by mouth daily. 90 tablet 3   losartan (COZAAR) 100 MG tablet Take 1 tablet (100 mg total) by mouth daily. 90  tablet 3   SYNTHROID 75 MCG tablet Take 1 tablet (75 mcg total) by mouth daily before breakfast. 90 tablet 3   tiZANidine (ZANAFLEX) 4 MG tablet Take 1 tablet (4 mg total) by mouth every 6 (six) hours as needed for muscle spasms. 30 tablet 0   Current Facility-Administered Medications  Medication Dose Route Frequency Provider Last Rate Last Admin   Study - VESALIUS - evolocumab (AMG 145) 140 mg/mL or placebo SQ injection (PI-Hilty)  140 mg Subcutaneous Q14 Days Hilty, Lisette Abu, MD         Assessment & Plan    Hyperlipidemia Assessment: Patient with ASCVD not at LDL goal of < 70 Most recent LDL 147 on 10/05/22 Not able to tolerate statins secondary to myalgias Has not taken IP in VESALIUS trial  for some time, secondary to elevated AST/ALT enzymes. AST and ALT now back to WNL Reviewed options for lowering LDL cholesterol, including ezetimibe, PCSK-9 inhibitors, bempedoic acid and inclisiran.  Discussed mechanisms of action, dosing, side effects, potential decreases in LDL cholesterol and costs.  Also reviewed potential options for patient assistance.  Plan: Patient agreeable to starting inclisiran Repeat labs after:   Lipid - after 3 months Liver function - after 1 month Patient was given information on [employer] grant/signed up for [employer] while in office today.    Phillips Hay, PharmD CPP [facility] [address]  [place], [place] [address] [phone]  01/21/2023, 11:12 AM

## 2023-01-21 NOTE — Assessment & Plan Note (Signed)
Assessment: Patient with ASCVD not at LDL goal of < 70 Most recent LDL 147 on 10/05/22 Not able to tolerate statins secondary to myalgias Has not taken IP in VESALIUS trial for some time, secondary to elevated AST/ALT enzymes. AST and ALT now back to WNL Reviewed options for lowering LDL cholesterol, including ezetimibe, PCSK-9 inhibitors, bempedoic acid and inclisiran.  Discussed mechanisms of action, dosing, side effects, potential decreases in LDL cholesterol and costs.  Also reviewed potential options for patient assistance.  Plan: Patient agreeable to starting inclisiran Repeat labs after:   Lipid - after 3 months Liver function - after 1 month Patient was given information on [employer] grant/signed up for [employer] while in office today.

## 2023-01-21 NOTE — Patient Instructions (Signed)
Your Results:             Your most recent labs Goal  Total Cholesterol 233 < 200  Triglycerides 94 < 150  HDL (happy/good cholesterol) 74 > 40  LDL (lousy/bad cholesterol 147 < 70   Medication changes:  We will start the process to get Leqvio covered by your insurance.  Once the prior authorization is complete, I will call/send a MyChart message to let you know and confirm pharmacy information.   You will take one injection every 3 months for 2 doses then every 6 months therafter  Lab orders:  We want to repeat labs after 2-3 months.  We will send you a lab order to remind you once we get closer to that time.      Thank you for choosing [facility]

## 2023-01-23 ENCOUNTER — Other Ambulatory Visit: Payer: Self-pay | Admitting: Pharmacist Clinician (PhC)/ Clinical Pharmacy Specialist

## 2023-01-23 NOTE — Progress Notes (Signed)
Leqvio orders placed 

## 2023-01-28 ENCOUNTER — Telehealth: Payer: Self-pay

## 2023-01-28 NOTE — Telephone Encounter (Signed)
-----   Message from Chrystie Nose, MD sent at 01/28/2023  8:38 AM EDT ----- Regarding: RE: research patient Ok .Marland Kitchen Would be ok to switch to [facility] per pharmacy if that is ok with the study follow-up..  Dr Rexene Edison ----- Message ----- From: Myra Rude, LPN Sent: 0/98/1191  10:31 AM EDT To: Chrystie Nose, MD Subject: RE: research patient                           Sorry, I forgot to tell you that she stopped the study drug in August 2022 due to elevated ALT.  We have only been doing telephone follow ups with her since 2022. ----- Message ----- From: Chrystie Nose, MD Sent: 01/25/2023   9:37 AM EDT To: Rosalee Kaufman, RPH-CPP; # Subject: RE: research patient                           Is she having side effects? Why the change to [facility] - would like to keep her in [facility] since she committed to the trial until completion if possible.  -[place]  ----- Message ----- From: Myra Rude, LPN Sent: 4/78/2956   4:08 PM EDT To: Chrystie Nose, MD Subject: FW: research patient                           Hi Dr.Hilty,  Please read the message below from [facility] A. I called the pt and she said she didn't mind staying in the [place] study just for telephone follow ups but she does not want the study drug at this time. She would like to try the Leqvio.  Please advise.  ----- Message ----- From: Rosalee Kaufman, RPH-CPP Sent: 01/23/2023  11:07 AM EDT To: Myra Rude, LPN Subject: research patient                               Lindsay Hodges  I saw this patient a few days ago and we would like to have her start on Leqvio.  It looks like she has been in the VESVALIUS trial, but not actively taking med.  (Maybe 2/2 elevated liver enzymes?)  Can we go ahead and start her on Leqvio?  Phillips Hay PharmD

## 2023-01-28 NOTE — Telephone Encounter (Cosign Needed)
Received message from [place] A.PharmD about pt switching to [facility]. I called the pt and she does not want to start back on the [place] study drug d/t elevated liver enzymes. Pt is wanting to try Leqvio. She is aware that she cannot be on both medications and she stated it was ok to continue the telephone follow ups until the end of the [place] study. I have sent a message to the monitor of the study and she checked with the [place] team and it is ok for the pt to be put on Leqvio if she is not re-starting on the study drug. I have sent a message to [facility] A. PharmD

## 2023-02-06 NOTE — Research (Addendum)
late entry:       Non-Fatal Potential Endpoint Assessment Yes  No   Has the subject experienced/undergone any of the following since the last visit/contact?   []   [x]    Any Coronary Artery Revascularization/Cerebrovascular Revascularization/ Peripheral Artery Revascularization/Amputation Procedure   []   [x]    Myocardial Infarction []   [x]    Stroke   []   x  Provide the date for the non-fatal Potential Endpoints status:   []   [x] 

## 2023-02-06 NOTE — Telephone Encounter (Addendum)
late entry:       Non-Fatal Potential Endpoint Assessment Yes  No   Has the subject experienced/undergone any of the following since the last visit/contact?   []  [x]   Any Coronary Artery Revascularization/Cerebrovascular Revascularization/ Peripheral Artery Revascularization/Amputation Procedure   []  [x]   Myocardial Infarction []  [x]   Stroke   []  x  Provide the date for the non-fatal Potential Endpoints status:   []  [x]    

## 2023-02-06 NOTE — Research (Addendum)
late entry:      Non-Fatal Potential Endpoint Assessment Yes  No   Has the subject experienced/undergone any of the following since the last visit/contact?   []   [x]    Any Coronary Artery Revascularization/Cerebrovascular Revascularization/ Peripheral Artery Revascularization/Amputation Procedure   []   [x]    Myocardial Infarction []   [x]    Stroke   []   x  Provide the date for the non-fatal Potential Endpoints status:   []   [x] 

## 2023-02-21 ENCOUNTER — Telehealth: Payer: Self-pay | Admitting: Cardiology

## 2023-02-21 NOTE — Telephone Encounter (Signed)
Routed to pharmacy team 

## 2023-02-21 NOTE — Telephone Encounter (Signed)
Pt c/o medication issue:  1. Name of Medication:  Leqvio  2. How are you currently taking this medication (dosage and times per day)?   3. Are you having a reaction (difficulty breathing--STAT)?   4. What is your medication issue?  See 4/29 encounter. Patient is following up regarding starting on Leqvio. She would like a call back to discuss.

## 2023-02-27 ENCOUNTER — Other Ambulatory Visit: Payer: Self-pay | Admitting: Pharmacist Clinician (PhC)/ Clinical Pharmacy Specialist

## 2023-03-01 ENCOUNTER — Encounter: Payer: Self-pay | Admitting: Cardiology

## 2023-03-01 NOTE — Progress Notes (Signed)
Leqvio order placed 

## 2023-03-04 ENCOUNTER — Telehealth: Payer: Self-pay

## 2023-03-04 ENCOUNTER — Telehealth: Payer: Self-pay | Admitting: Pharmacy Technician

## 2023-03-04 NOTE — Telephone Encounter (Signed)
[facility] note:  Auth Submission: NO AUTH NEEDED Site of care: Site of care: [facility] Payer: medicare a/b & cigna supp Medication & CPT/J Code(s) submitted: Leqvio (Inclisiran) [phone] Route of submission (phone, fax, portal):  Phone # Fax # Auth type: Buy/Bill Units/visits requested: x2 Reference number:  Approval from: 03/04/23 to 09/30/25   Medicare will cover 80% and supp will pick-up remaining 20%. Patient will be scheduled as soon as possible.

## 2023-03-04 NOTE — Telephone Encounter (Signed)
-----   Message from Kenneth C Hilty, MD sent at 01/28/2023  8:38 AM EDT ----- Regarding: RE: research patient Ok .. Would be ok to switch to Leqvio per pharmacy if that is ok with the study follow-up..  Dr H ----- Message ----- From: Ethylene Reznick H, LPN Sent: 01/25/2023  10:31 AM EDT To: Kenneth C Hilty, MD Subject: RE: research patient                           Sorry, I forgot to tell you that she stopped the study drug in August 2022 due to elevated ALT.  We have only been doing telephone follow ups with her since 2022. ----- Message ----- From: Hilty, Kenneth C, MD Sent: 01/25/2023   9:37 AM EDT To: Kristin L Alvstad, RPH-CPP; # Subject: RE: research patient                           Is she having side effects? Why the change to Leqvio - would like to keep her in [place] since she committed to the trial until completion if possible.  -[place]  ----- Message ----- From: Tinesha Siegrist H, LPN Sent: 01/24/2023   4:08 PM EDT To: Kenneth C Hilty, MD Subject: FW: research patient                           Hi Dr.Hilty,  Please read the message below from Kristin A. I called the pt and she said she didn't mind staying in the [place] study just for telephone follow ups but she does not want the study drug at this time. She would like to try the Leqvio.  Please advise.  ----- Message ----- From: Alvstad, Kristin L, RPH-CPP Sent: 01/23/2023  11:07 AM EDT To: [name] H Trinady Milewski, LPN Subject: research patient                               Hi [name]  I saw this patient a few days ago and we would like to have her start on Leqvio.  It looks like she has been in the VESVALIUS trial, but not actively taking med.  (Maybe 2/2 elevated liver enzymes?)  Can we go ahead and start her on Leqvio?  Kristin Alvstad PharmD       

## 2023-03-04 NOTE — Research (Addendum)
Late entry:  Physical measurements for this visit per documentation:  Height: 65 inches Weight: 146lbs BMI: 24.3  Vitals for this visit per documentation:  BP: 139/54 (sitting,Arm) HR: 65

## 2023-03-13 NOTE — Research (Signed)
Late entry:  Per patients chart: she has documented intolerance to atorvastatin, pravastatin ,rosuvastatin due to Myalgia.

## 2023-03-14 ENCOUNTER — Ambulatory Visit (INDEPENDENT_AMBULATORY_CARE_PROVIDER_SITE_OTHER): Payer: Medicare Other

## 2023-03-14 VITALS — BP 146/60 | HR 52 | Temp 97.3°F | Resp 18 | Ht 65.0 in | Wt 144.8 lb

## 2023-03-14 DIAGNOSIS — E785 Hyperlipidemia, unspecified: Secondary | ICD-10-CM | POA: Diagnosis not present

## 2023-03-14 MED ORDER — INCLISIRAN SODIUM 284 MG/1.5ML ~~LOC~~ SOSY
284.0000 mg | PREFILLED_SYRINGE | Freq: Once | SUBCUTANEOUS | Status: AC
  Administered 2023-03-14: 284 mg via SUBCUTANEOUS
  Filled 2023-03-14: qty 1.5

## 2023-03-14 NOTE — Patient Instructions (Signed)
Inclisiran Injection What is this medication? INCLISIRAN (in kli SIR an) treats high cholesterol. It works by decreasing bad cholesterol (such as LDL) in your blood. Changes to diet and exercise are often combined with this medication. This medicine may be used for other purposes; ask your health care provider or pharmacist if you have questions. COMMON BRAND NAME(S): LEQVIO What should I tell my care team before I take this medication? They need to know if you have any of these conditions: An unusual or allergic reaction to inclisiran, other medications, foods, dyes, or preservatives Pregnant or trying to get pregnant Breast-feeding How should I use this medication? This medication is injected under the skin. It is given by your care team in a hospital or clinic setting. Talk to your care team about the use of this medication in children. Special care may be needed. Overdosage: If you think you have taken too much of this medicine contact a poison control center or emergency room at once. NOTE: This medicine is only for you. Do not share this medicine with others. What if I miss a dose? Keep appointments for follow-up doses. It is important not to miss your dose. Call your care team if you are unable to keep an appointment. What may interact with this medication? Interactions are not expected. This list may not describe all possible interactions. Give your health care provider a list of all the medicines, herbs, non-prescription drugs, or dietary supplements you use. Also tell them if you smoke, drink alcohol, or use illegal drugs. Some items may interact with your medicine. What should I watch for while using this medication? Visit your care team for regular checks on your progress. Tell your care team if your symptoms do not start to get better or if they get worse. You may need blood work while you are taking this medication. What side effects may I notice from receiving this  medication? Side effects that you should report to your care team as soon as possible: Allergic reactions--skin rash, itching, hives, swelling of the face, lips, tongue, or throat Side effects that usually do not require medical attention (report these to your care team if they continue or are bothersome): Joint pain Pain, redness, or irritation at injection site This list may not describe all possible side effects. Call your doctor for medical advice about side effects. You may report side effects to FDA at 1-800-FDA-1088. Where should I keep my medication? This medication is given in a hospital or clinic. It will not be stored at home. NOTE: This sheet is a summary. It may not cover all possible information. If you have questions about this medicine, talk to your doctor, pharmacist, or health care provider.  2024 Elsevier/Gold Standard (2022-11-25 00:00:00)  

## 2023-03-14 NOTE — Progress Notes (Signed)
Diagnosis: Hyperlipidemia  Provider:  Mannam, Praveen MD  Procedure: Injection  Leqvio (inclisiran), Dose: 284 mg, Site: subcutaneous, Number of injections: 1  Post Care: Observation period completed  Discharge: Condition: Good, Destination: Home . AVS Provided  Performed by:  Mallory Schaad, RN       

## 2023-03-21 DIAGNOSIS — Z006 Encounter for examination for normal comparison and control in clinical research program: Secondary | ICD-10-CM

## 2023-03-21 NOTE — Research (Signed)
   Medical record review done for week 160 of the vesalius trial. Spoke with pt recently and she is not wanting to restart IP-  she has recently started Leqvio. Updated in [facility].              Non-Fatal Potential Endpoint Assessment Yes  No   Has the subject experienced/undergone any of the following since the last visit/contact?   []   [x]    Any Coronary Artery Revascularization/Cerebrovascular Revascularization/ Peripheral Artery Revascularization/Amputation Procedure   []   [x]    Myocardial Infarction []   [x]    Stroke   []   x  Provide the date for the non-fatal Potential Endpoints status:   []   [x]

## 2023-05-28 ENCOUNTER — Encounter: Payer: Self-pay | Admitting: Internal Medicine

## 2023-06-12 ENCOUNTER — Encounter: Payer: Self-pay | Admitting: Cardiology

## 2023-06-13 ENCOUNTER — Ambulatory Visit (AMBULATORY_SURGERY_CENTER): Payer: Medicare Other

## 2023-06-13 VITALS — Ht 65.0 in | Wt 140.0 lb

## 2023-06-13 DIAGNOSIS — Z86010 Personal history of colonic polyps: Secondary | ICD-10-CM

## 2023-06-13 NOTE — Progress Notes (Signed)

## 2023-06-17 ENCOUNTER — Ambulatory Visit (INDEPENDENT_AMBULATORY_CARE_PROVIDER_SITE_OTHER): Payer: Medicare Other

## 2023-06-17 VITALS — BP 137/74 | HR 65 | Temp 97.6°F | Resp 16 | Ht 65.0 in | Wt 147.4 lb

## 2023-06-17 DIAGNOSIS — E785 Hyperlipidemia, unspecified: Secondary | ICD-10-CM | POA: Diagnosis not present

## 2023-06-17 MED ORDER — INCLISIRAN SODIUM 284 MG/1.5ML ~~LOC~~ SOSY
284.0000 mg | PREFILLED_SYRINGE | Freq: Once | SUBCUTANEOUS | Status: AC
  Administered 2023-06-17: 284 mg via SUBCUTANEOUS
  Filled 2023-06-17: qty 1.5

## 2023-06-17 NOTE — Progress Notes (Signed)
Diagnosis: Hyperlipidemia  Provider:  Chilton Greathouse MD  Procedure: Injection  Leqvio (inclisiran), Dose: 284 mg, Site: subcutaneous, Number of injections: 1  Post Care: Patient declined observation  Discharge: Condition: Good, Destination: Home . AVS Provided  Performed by:  Rico Ala, LPN

## 2023-06-17 NOTE — Patient Instructions (Signed)
Inclisiran Injection What is this medication? INCLISIRAN (in kli SIR an) treats high cholesterol. It works by decreasing bad cholesterol (such as LDL) in your blood. Changes to diet and exercise are often combined with this medication. This medicine may be used for other purposes; ask your health care provider or pharmacist if you have questions. COMMON BRAND NAME(S): LEQVIO What should I tell my care team before I take this medication? They need to know if you have any of these conditions: An unusual or allergic reaction to inclisiran, other medications, foods, dyes, or preservatives Pregnant or trying to get pregnant Breast-feeding How should I use this medication? This medication is injected under the skin. It is given by your care team in a hospital or clinic setting. Talk to your care team about the use of this medication in children. Special care may be needed. Overdosage: If you think you have taken too much of this medicine contact a poison control center or emergency room at once. NOTE: This medicine is only for you. Do not share this medicine with others. What if I miss a dose? Keep appointments for follow-up doses. It is important not to miss your dose. Call your care team if you are unable to keep an appointment. What may interact with this medication? Interactions are not expected. This list may not describe all possible interactions. Give your health care provider a list of all the medicines, herbs, non-prescription drugs, or dietary supplements you use. Also tell them if you smoke, drink alcohol, or use illegal drugs. Some items may interact with your medicine. What should I watch for while using this medication? Visit your care team for regular checks on your progress. Tell your care team if your symptoms do not start to get better or if they get worse. You may need blood work while you are taking this medication. What side effects may I notice from receiving this  medication? Side effects that you should report to your care team as soon as possible: Allergic reactions--skin rash, itching, hives, swelling of the face, lips, tongue, or throat Side effects that usually do not require medical attention (report these to your care team if they continue or are bothersome): Joint pain Pain, redness, or irritation at injection site This list may not describe all possible side effects. Call your doctor for medical advice about side effects. You may report side effects to FDA at 1-800-FDA-1088. Where should I keep my medication? This medication is given in a hospital or clinic. It will not be stored at home. NOTE: This sheet is a summary. It may not cover all possible information. If you have questions about this medicine, talk to your doctor, pharmacist, or health care provider.  2024 Elsevier/Gold Standard (2022-04-13 00:00:00)

## 2023-07-08 ENCOUNTER — Encounter: Payer: Self-pay | Admitting: *Deleted

## 2023-07-08 NOTE — Research (Unsigned)
Spoke with patient on phone  Vesalius Follow up week 176  Vitals: []   NOT DONE DUE TO PHONE CALL  Experience any AE/SAE/Hospitalizations [x]  NO  If yes please explain:  PATIENT WENT TO ER FOR CHEST PAIN - NO HEART ATTACK AND WAS DISCHARGED HOME 08/07@ 0759 -08/08 @ 1500  Non-Fatal Potential Endpoint Assessment Yes  No   Has the subject experienced/undergone any of the following since the last visit/contact?   []   [x]    Any Coronary Artery Revascularization/Cerebrovascular Revascularization/ Peripheral Artery Revascularization/Amputation Procedure   []   [x]    Myocardial Infarction []   [x]    Stroke   []   [x]    Provide the date for the non-fatal Potential Endpoints status:   []   [x]    IP admin please see MAR (please add Box # to comment section on MAR) []   NOT GIVEN   MED LIST UPDATED   Current Outpatient Medications:    aspirin EC 81 MG tablet, Take by mouth., Disp: , Rfl:    losartan (COZAAR) 100 MG tablet, Take 1 tablet (100 mg total) by mouth daily., Disp: 90 tablet, Rfl: 3   SYNTHROID 75 MCG tablet, Take 1 tablet (75 mcg total) by mouth daily before breakfast., Disp: 90 tablet, Rfl: 3   tiZANidine (ZANAFLEX) 4 MG tablet, Take 1 tablet (4 mg total) by mouth every 6 (six) hours as needed for muscle spasms., Disp: 30 tablet, Rfl: 0   Vitamin D, Ergocalciferol, (DRISDOL) 1.25 MG (50000 UNIT) CAPS capsule, Take 50,000 Units by mouth every 7 (seven) days., Disp: , Rfl:    amLODipine (NORVASC) 5 MG tablet, Take 1 tablet (5 mg total) by mouth daily. (Patient not taking: Reported on 07/08/2023), Disp: 90 tablet, Rfl: 3  PATIENT IS TAKING INCLISARIN EVERY 6 MONTHS FIRST DOSE WAS 03/14/2023

## 2023-07-11 ENCOUNTER — Encounter: Payer: Self-pay | Admitting: Internal Medicine

## 2023-07-11 ENCOUNTER — Ambulatory Visit: Payer: Medicare Other | Admitting: Internal Medicine

## 2023-07-11 VITALS — BP 131/55 | HR 70 | Temp 97.7°F | Resp 16 | Ht 65.0 in | Wt 140.0 lb

## 2023-07-11 DIAGNOSIS — Z09 Encounter for follow-up examination after completed treatment for conditions other than malignant neoplasm: Secondary | ICD-10-CM | POA: Diagnosis not present

## 2023-07-11 DIAGNOSIS — D122 Benign neoplasm of ascending colon: Secondary | ICD-10-CM | POA: Diagnosis not present

## 2023-07-11 DIAGNOSIS — D123 Benign neoplasm of transverse colon: Secondary | ICD-10-CM

## 2023-07-11 DIAGNOSIS — D120 Benign neoplasm of cecum: Secondary | ICD-10-CM

## 2023-07-11 DIAGNOSIS — Z860100 Personal history of colon polyps, unspecified: Secondary | ICD-10-CM

## 2023-07-11 MED ORDER — SODIUM CHLORIDE 0.9 % IV SOLN: 500.0000 mL | INTRAVENOUS | Status: DC

## 2023-07-11 NOTE — Progress Notes (Signed)
Called to room to assist during endoscopic procedure.  Patient ID and intended procedure confirmed with present staff. Received instructions for my participation in the procedure from the performing physician.  

## 2023-07-11 NOTE — Progress Notes (Signed)
Report to PACU, RN, vss, BBS= Clear.  

## 2023-07-11 NOTE — Progress Notes (Signed)
[facility] History and Physical   Primary Care Physician:  Montez Hageman, DO   Reason for Procedure:    Encounter Diagnosis  Name Primary?   History of colonic polyps Yes     Plan:    colonoscopy     HPI: SOLARA GOODCHILD is a 67 y.o. female w/ last colonoscopy 2019  - One diminutive polyp in the ascending colon,                            removed with a cold snare. Resected and retrieved.                           - Diverticulosis in the sigmoid colon.                           - The examination was otherwise normal on direct                            and retroflexion views.                           - Personal history of colonic polyp - diminutive                            adenoma 2004, no polyps 2009  Polyp hx 11/2002 sub-centimeter adenoma 2009 - no polyps. 07/31/2018 diminutive polyp - adenoma recall 2024    Past Medical History:  Diagnosis Date   AAA (abdominal aortic aneurysm) (HCC)    Arthritis    Bursitis of hip 08/02/2015   Carotid disease, bilateral (HCC)    [place] 10/15: R 40-59%, L 50-69%, stable for years   Elevated sed rate    remote history of myelosuppressive disorder   Grave's disease    s/p I-131 ablation 1990s   Hyperlipidemia    Hypertension    HYPOTHYROIDISM    Left knee pain 03/26/2014   Personal history of colonic adenoma 11/30/2002   Preventative health care 09/04/2015   Thrombophlebitis 10/16/2016   Vitamin D deficiency     Past Surgical History:  Procedure Laterality Date   COLONOSCOPY     Jaw surgery  2001   OOPHORECTOMY Bilateral    POLYPECTOMY     VAGINAL HYSTERECTOMY  2009   vaginal hysterectomy    Prior to Admission medications   Medication Sig Start Date End Date Taking? Authorizing Provider  aspirin EC 81 MG tablet Take by mouth.   Yes [provider]  losartan (COZAAR) 100 MG tablet Take 1 tablet (100 mg total) by mouth daily. 02/22/22  Yes Lewayne Bunting, MD  SYNTHROID 75 MCG tablet Take  1 tablet (75 mcg total) by mouth daily before breakfast. 12/12/20  Yes Bradd Canary, MD  Vitamin D, Ergocalciferol, (DRISDOL) 1.25 MG (50000 UNIT) CAPS capsule Take 50,000 Units by mouth every 7 (seven) days.   Yes [provider]  amLODipine (NORVASC) 5 MG tablet Take 1 tablet (5 mg total) by mouth daily. Patient not taking: Reported on 07/08/2023 10/23/21   Lewayne Bunting, MD  tiZANidine (ZANAFLEX) 4 MG tablet Take 1 tablet (4 mg total) by mouth every 6 (six) hours as needed for muscle spasms. 05/18/21   Bradd Canary, MD  Current Outpatient Medications  Medication Sig Dispense Refill   aspirin EC 81 MG tablet Take by mouth.     losartan (COZAAR) 100 MG tablet Take 1 tablet (100 mg total) by mouth daily. 90 tablet 3   SYNTHROID 75 MCG tablet Take 1 tablet (75 mcg total) by mouth daily before breakfast. 90 tablet 3   Vitamin D, Ergocalciferol, (DRISDOL) 1.25 MG (50000 UNIT) CAPS capsule Take 50,000 Units by mouth every 7 (seven) days.     amLODipine (NORVASC) 5 MG tablet Take 1 tablet (5 mg total) by mouth daily. (Patient not taking: Reported on 07/08/2023) 90 tablet 3   tiZANidine (ZANAFLEX) 4 MG tablet Take 1 tablet (4 mg total) by mouth every 6 (six) hours as needed for muscle spasms. 30 tablet 0   Current Facility-Administered Medications  Medication Dose Route Frequency Provider Last Rate Last Admin   0.9 %  sodium chloride infusion  500 mL Intravenous Continuous Iva Boop, MD        Allergies as of 07/11/2023 - Review Complete 07/11/2023  Allergen Reaction Noted   Penicillins Swelling 06/30/2008   Statins Other (See Comments) 03/01/2017    Family History  Problem Relation Age of Onset   Hypertension Mother    Arthritis Mother    Hyperlipidemia Mother    Stroke Mother        1st age 61, then recurrent 43   Atrial fibrillation Mother    Epilepsy Mother    Arthritis Father    Breast cancer Sister    Cancer Sister        Recurrent Breast CA   Atrial  fibrillation Brother    Drug abuse Maternal Aunt    Von Willebrand disease Daughter    Other Daughter        POTS   Factor V Leiden deficiency Daughter    Breast cancer Other    Diabetes Neg Hx    Heart attack Neg Hx    Colon cancer Neg Hx    Esophageal cancer Neg Hx    Rectal cancer Neg Hx    Stomach cancer Neg Hx    Colon polyps Neg Hx     Social History   Socioeconomic History   Marital status: Married    Spouse name: Not on file   Number of children: Not on file   Years of education: Not on file   Highest education level: Not on file  Occupational History    Comment: [employer]  Tobacco Use   Smoking status: Former    Current packs/day: 0.00    Average packs/day: 0.5 packs/day for 12.0 years (6.0 ttl pk-yrs)    Types: Cigarettes    Start date: 02/06/1981    Quit date: 02/06/1993    Years since quitting: 30.4   Smokeless tobacco: Never  Vaping Use   Vaping status: Never Used  Substance and Sexual Activity   Alcohol use: No   Drug use: No   Sexual activity: Yes  Other Topics Concern   Not on file  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   Financial Resource Strain: Not on file  Food Insecurity: Low Risk  (05/10/2023)   Received from [facility]   Hunger Vital Sign    Worried About Running Out of Food in the Last Year: Never true    Ran Out of Food in the Last Year: Never true  Transportation Needs: Not on file (05/10/2023)  Physical  Activity: Not on file  Stress: Not on file  Social Connections: Not on file  Intimate Partner Violence: Not on file    Review of Systems:  All other review of systems negative except as mentioned in the HPI.  Physical Exam: Vital signs BP 134/60   Pulse 65   Temp 97.7 F (36.5 C)   Ht 5\' 5"  (1.651 m)   Wt 140 lb (63.5 kg)   SpO2 100%   BMI 23.30 kg/m   General:   Alert,  Well-developed, well-nourished, pleasant and cooperative in NAD Lungs:  Clear throughout to  auscultation.   Heart:  Regular rate and rhythm; no murmurs, clicks, rubs,  or gallops. Abdomen:  Soft, nontender and nondistended. Normal bowel sounds.   Neuro/Psych:  Alert and cooperative. Normal mood and affect. A and O x 3   @Awa Bachicha  Sena Slate, MD, [facility] [phone] (pager) 07/11/2023 8:00 AM@

## 2023-07-11 NOTE — Patient Instructions (Addendum)
I removed 4 very small polyps today.  I will let you know pathology results and when to have another routine colonoscopy by mail and/or My Chart.  I appreciate the opportunity to care for you. Iva Boop, MD, FACG   YOU HAD AN ENDOSCOPIC PROCEDURE TODAY AT THE [facility]:   Refer to the procedure report that was given to you for any specific questions about what was found during the examination.  If the procedure report does not answer your questions, please call your gastroenterologist to clarify.  If you requested that your care partner not be given the details of your procedure findings, then the procedure report has been included in a sealed envelope for you to review at your convenience later.  YOU SHOULD EXPECT: Some feelings of bloating in the abdomen. Passage of more gas than usual.  Walking can help get rid of the air that was put into your GI tract during the procedure and reduce the bloating. If you had a lower endoscopy (such as a colonoscopy or flexible sigmoidoscopy) you may notice spotting of blood in your stool or on the toilet paper. If you underwent a bowel prep for your procedure, you may not have a normal bowel movement for a few days.  Please Note:  You might notice some irritation and congestion in your nose or some drainage.  This is from the oxygen used during your procedure.  There is no need for concern and it should clear up in a day or so.  SYMPTOMS TO REPORT IMMEDIATELY:  Following lower endoscopy (colonoscopy or flexible sigmoidoscopy):  Excessive amounts of blood in the stool  Significant tenderness or worsening of abdominal pains  Swelling of the abdomen that is new, acute  Fever of 100F or higher  For urgent or emergent issues, a gastroenterologist can be reached at any hour by calling [phone]. Do not use MyChart messaging for urgent concerns.    DIET:  We do recommend a small meal at first, but then you may proceed to your regular  diet.  Drink plenty of fluids but you should avoid alcoholic beverages for 24 hours.  ACTIVITY:  You should plan to take it easy for the rest of today and you should NOT DRIVE or use heavy machinery until tomorrow (because of the sedation medicines used during the test).    FOLLOW UP: Our staff will call the number listed on your records the next business day following your procedure.  We will call around 7:15- 8:00 am to check on you and address any questions or concerns that you may have regarding the information given to you following your procedure. If we do not reach you, we will leave a message.     If any biopsies were taken you will be contacted by phone or by letter within the next 1-3 weeks.  Please call us at [phone] if you have not heard about the biopsies in 3 weeks.    SIGNATURES/CONFIDENTIALITY: You and/or your care partner have signed paperwork which will be entered into your electronic medical record.  These signatures attest to the fact that that the information above on your After Visit Summary has been reviewed and is understood.  Full responsibility of the confidentiality of this discharge information lies with you and/or your care-partner.

## 2023-07-11 NOTE — Op Note (Signed)
[facility] Patient Name: Lindsay Hodges Procedure Date: 07/11/2023 7:12 AM MRN: [id number] Endoscopist: Iva Boop , MD, [id number] Age: 67 Referring MD:  Date of Birth: 07/29/1956 Gender: Female Account #: [id number] Procedure:                Colonoscopy Indications:              Last colonoscopy: October 2019 Medicines:                Monitored Anesthesia Care Procedure:                Pre-Anesthesia Assessment:                           - Prior to the procedure, a History and Physical                            was performed, and patient medications and                            allergies were reviewed. The patient's tolerance of                            previous anesthesia was also reviewed. The risks                            and benefits of the procedure and the sedation                            options and risks were discussed with the patient.                            All questions were answered, and informed consent                            was obtained. Prior Anticoagulants: The patient has                            taken no anticoagulant or antiplatelet agents. ASA                            Grade Assessment: II - A patient with mild systemic                            disease. After reviewing the risks and benefits,                            the patient was deemed in satisfactory condition to                            undergo the procedure.                           After obtaining informed consent, the colonoscope  was passed under direct vision. Throughout the                            procedure, the patient's blood pressure, pulse, and                            oxygen saturations were monitored continuously. The                            CF [id number] #[id number] was introduced through the anus                            and advanced to the the cecum, identified by                            appendiceal orifice and  ileocecal valve. The                            colonoscopy was performed without difficulty. The                            patient tolerated the procedure well. The quality                            of the bowel preparation was adequate. The                            ileocecal valve, appendiceal orifice, and rectum                            were photographed. The bowel preparation used was                            Miralax via split dose instruction. Scope In: 8:10:00 AM Scope Out: 8:23:46 AM Scope Withdrawal Time: 0 hours 9 minutes 54 seconds  Total Procedure Duration: 0 hours 13 minutes 46 seconds  Findings:                 The perianal and digital rectal examinations were                            normal.                           Four sessile polyps were found in the proximal                            transverse colon, ascending colon and cecum. The                            polyps were 1 to 6 mm in size. These polyps were                            removed with a cold snare. Resection and retrieval  were complete. Verification of patient                            identification for the specimen was done. Estimated                            blood loss was minimal.                           Multiple diverticula were found in the sigmoid                            colon.                           The exam was otherwise without abnormality on                            direct and retroflexion views. Complications:            No immediate complications. Estimated Blood Loss:     Estimated blood loss was minimal. Impression:               - Four 1 to 6 mm polyps in the proximal transverse                            colon, in the ascending colon and in the cecum,                            removed with a cold snare. Resected and retrieved.                           - Diverticulosis in the sigmoid colon.                           - The examination was  otherwise normal on direct                            and retroflexion views.                           - Personal history of colonic polyps. 11/2002                            sub-centimeter adenoma                           2009 - no polyps.                           07/31/2018 diminutive polyp - Recommendation:           - Patient has a contact number available for                            emergencies. The signs and symptoms of potential  delayed complications were discussed with the                            patient. Return to normal activities tomorrow.                            Written discharge instructions were provided to the                            patient.                           - Resume previous diet.                           - Continue present medications.                           - Repeat colonoscopy is recommended for                            surveillance. The colonoscopy date will be                            determined after pathology results from today's                            exam become available for review. Iva Boop, MD 07/11/2023 8:34:16 AM This report has been signed electronically.

## 2023-07-12 ENCOUNTER — Telehealth: Payer: Self-pay | Admitting: *Deleted

## 2023-07-12 NOTE — Telephone Encounter (Signed)
  Follow up Call-     07/11/2023    7:11 AM  Call back number  Post procedure Call Back phone  # [phone]  Permission to leave phone message Yes     Patient questions:  Do you have a fever, pain , or abdominal swelling? No. Pain Score  0 *  Have you tolerated food without any problems? Yes.    Have you been able to return to your normal activities? Yes.    Do you have any questions about your discharge instructions: Diet   No. Medications  No. Follow up visit  No.  Do you have questions or concerns about your Care? No.  Actions: * If pain score is 4 or above: No action needed, pain <4.

## 2023-07-15 LAB — SURGICAL PATHOLOGY

## 2023-07-22 ENCOUNTER — Encounter: Payer: Self-pay | Admitting: Internal Medicine

## 2023-09-03 ENCOUNTER — Encounter: Payer: Self-pay | Admitting: Cardiology

## 2023-09-26 NOTE — Progress Notes (Signed)
 HPI: FU cerebrovascular disease. Echocardiogram in 2004 showed normal LV function. Nuclear study January 2017 showed ejection fraction 58% and normal perfusion. ABIs March 2021 normal. Abdominal ultrasound December 2022 showed abdominal aortic aneurysm 3.3 cm.  Aortic atherosclerosis noted. Carotid Dopplers March 2024 showed 1 to 39% right, 40 to 59% left stenosis.  Abdominal ultrasound March 2024 showed 4 cm abdominal aortic aneurysm.  Patient admitted to [facility] August 2024 with chest pain.  CTA showed 4.3 cm descending thoracic aortic aneurysm Ryann coronary calcification.  Nuclear study August 2024 showed normal LV function with ejection fraction 67% and no ischemia or infarction.  Since last seen, the patient denies any dyspnea on exertion, orthopnea, PND, pedal edema, palpitations, syncope or chest pain.   Current Outpatient Medications  Medication Sig Dispense Refill   aspirin EC 81 MG tablet Take by mouth.     losartan  (COZAAR ) 100 MG tablet Take 1 tablet (100 mg total) by mouth daily. 90 tablet 3   SYNTHROID  75 MCG tablet Take 1 tablet (75 mcg total) by mouth daily before breakfast. 90 tablet 3   tiZANidine (ZANAFLEX) 4 MG tablet Take 1 tablet (4 mg total) by mouth every 6 (six) hours as needed for muscle spasms. 30 tablet 0   Vitamin D , Ergocalciferol , (DRISDOL ) 1.25 MG (50000 UNIT) CAPS capsule Take 50,000 Units by mouth every 7 (seven) days.     No current facility-administered medications for this visit.     Past Medical History:  Diagnosis Date   AAA (abdominal aortic aneurysm) (HCC)    Arthritis    Bursitis of hip 08/02/2015   Carotid disease, bilateral (HCC)    US  10/15: R 40-59%, L 50-69%, stable for years   Elevated sed rate    remote history of myelosuppressive disorder   Grave's disease    s/p I-131 ablation 1990s   Hyperlipidemia    Hypertension    HYPOTHYROIDISM    Left knee pain 03/26/2014   Personal history of colonic adenoma 11/30/2002   Preventative  health care 09/04/2015   Thrombophlebitis 10/16/2016   Vitamin D  deficiency     Past Surgical History:  Procedure Laterality Date   COLONOSCOPY     Jaw surgery  2001   OOPHORECTOMY Bilateral    POLYPECTOMY     VAGINAL HYSTERECTOMY  2009   vaginal hysterectomy    Social History   Socioeconomic History   Marital status: Married    Spouse name: Not on file   Number of children: Not on file   Years of education: Not on file   Highest education level: Not on file  Occupational History    Comment: [place] [employer]  Tobacco Use   Smoking status: Former    Current packs/day: 0.00    Average packs/day: 0.5 packs/day for 12.0 years (6.0 ttl pk-yrs)    Types: Cigarettes    Start date: 02/06/1981    Quit date: 02/06/1993    Years since quitting: 30.6   Smokeless tobacco: Never  Vaping Use   Vaping status: Never Used  Substance and Sexual Activity   Alcohol use: No   Drug use: No   Sexual activity: Yes  Other Topics Concern   Not on file  Social History Narrative   pharmacy tech III   Social Drivers of Health   Financial Resource Strain: Not on file  Food Insecurity: Low Risk  (10/07/2023)   Received from [facility]   Hunger Vital Sign    Worried About Running  Out of Food in the Last Year: Never true    Ran Out of Food in the Last Year: Never true  Transportation Needs: No Transportation Needs (10/07/2023)   Received from [facility]    In the past 12 months, has lack of reliable transportation kept you from medical appointments, meetings, work or from getting things needed for daily living? : No  Physical Activity: Not on file  Stress: Not on file  Social Connections: Not on file  Intimate Partner Violence: Not on file    Family History  Problem Relation Age of Onset   Hypertension Mother    Arthritis Mother    Hyperlipidemia Mother    Stroke Mother        1st age 57, then recurrent 53   Atrial fibrillation Mother    Epilepsy Mother     Arthritis Father    Breast cancer Sister    Cancer Sister        Recurrent Breast CA   Atrial fibrillation Brother    Drug abuse Maternal Aunt    Von Willebrand disease Daughter    Other Daughter        POTS   Factor V Leiden deficiency Daughter    Breast cancer Other    Diabetes Neg Hx    Heart attack Neg Hx    Colon cancer Neg Hx    Esophageal cancer Neg Hx    Rectal cancer Neg Hx    Stomach cancer Neg Hx    Colon polyps Neg Hx     ROS: no fevers or chills, productive cough, hemoptysis, dysphasia, odynophagia, melena, hematochezia, dysuria, hematuria, rash, seizure activity, orthopnea, PND, pedal edema, claudication. Remaining systems are negative.  Physical Exam: Well-developed well-nourished in no acute distress.  Skin is warm and dry.  HEENT is normal.  Neck is supple.  Chest is clear to auscultation with normal expansion.  Cardiovascular exam is regular rate and rhythm.  Abdominal exam nontender or distended. No masses palpated. Extremities show no edema. neuro grossly intact   A/P  1 carotid artery disease-followed by vascular surgery.  She plans follow-up carotid Dopplers with vascular surgery in [place].  2 abdominal aortic aneurysm-she plans follow-up ultrasound with vascular surgery in [place].  3 hyperlipidemia-intolerant to statins.  4 hypertension-blood pressure controlled.  Continue present medical regimen.  5 thoracic aortic aneurysm-she will discuss this further with the vascular surgeons in [facility].  Redell Shallow, MD

## 2023-10-08 ENCOUNTER — Encounter: Payer: Self-pay | Admitting: Cardiology

## 2023-10-08 ENCOUNTER — Ambulatory Visit: Payer: Medicare Other | Attending: Cardiology | Admitting: Cardiology

## 2023-10-08 VITALS — BP 136/58 | HR 73 | Ht 65.0 in | Wt 146.0 lb

## 2023-10-08 DIAGNOSIS — I739 Peripheral vascular disease, unspecified: Secondary | ICD-10-CM | POA: Diagnosis not present

## 2023-10-08 DIAGNOSIS — I10 Essential (primary) hypertension: Secondary | ICD-10-CM | POA: Insufficient documentation

## 2023-10-08 DIAGNOSIS — R072 Precordial pain: Secondary | ICD-10-CM | POA: Insufficient documentation

## 2023-10-08 NOTE — Patient Instructions (Signed)

## 2023-11-04 ENCOUNTER — Encounter: Payer: Self-pay | Admitting: *Deleted

## 2023-11-04 DIAGNOSIS — Z006 Encounter for examination for normal comparison and control in clinical research program: Secondary | ICD-10-CM

## 2023-11-04 NOTE — Research (Signed)
[place]:   Phone call only  Vitals: []   [x] N/A Experience any AE/SAE/Hospitalizations []   If yes please explain:   Non-Fatal Potential Endpoint Assessment Yes  No   Has the subject experienced/undergone any of the following since the last visit/contact?   []   [x]    Any Coronary Artery Revascularization/Cerebrovascular Revascularization/ Peripheral Artery Revascularization/Amputation Procedure   []   [x]    Myocardial Infarction []   [x]    Stroke   []   [x]    Provide the date for the non-fatal Potential Endpoints status:   []   [x]    Patient is followed by phone only.  Patient went to hospital for high blood pressure - checked out good and got discharged in stable condition.  No changes in her meds - still taking inclisarin next injection due in March 2025.

## 2023-11-15 ENCOUNTER — Telehealth: Payer: Self-pay | Admitting: Pharmacy Technician

## 2023-11-15 NOTE — Telephone Encounter (Signed)
Auth Submission: NO AUTH NEEDED Site of care: Site of care: [facility] Payer: MEDICARE A/B Medication & CPT/J Code(s) submitted: Leqvio (Inclisiran) J1306 Route of submission (phone, fax, portal):  Phone # Fax # Auth type: Buy/Bill HB Units/visits requested: 2 DOSES Reference number:  Approval from:  to      [employer] supp is in-active. Patient will be responsible for remaining 20%.  [facility]: Pending

## 2023-12-13 ENCOUNTER — Other Ambulatory Visit: Payer: Self-pay

## 2023-12-16 ENCOUNTER — Ambulatory Visit (INDEPENDENT_AMBULATORY_CARE_PROVIDER_SITE_OTHER): Payer: Medicare Other

## 2023-12-16 VITALS — BP 153/77 | HR 63 | Temp 97.7°F | Resp 16 | Ht 65.0 in | Wt 148.4 lb

## 2023-12-16 DIAGNOSIS — E785 Hyperlipidemia, unspecified: Secondary | ICD-10-CM

## 2023-12-16 MED ORDER — INCLISIRAN SODIUM 284 MG/1.5ML ~~LOC~~ SOSY
284.0000 mg | PREFILLED_SYRINGE | Freq: Once | SUBCUTANEOUS | Status: AC
  Administered 2023-12-16: 284 mg via SUBCUTANEOUS
  Filled 2023-12-16: qty 1.5

## 2023-12-16 NOTE — Progress Notes (Signed)
 Diagnosis: Hyperlipidemia  Provider:  Chilton Greathouse MD  Procedure: Injection  Leqvio (inclisiran), Dose: 284 mg, Site: subcutaneous, Number of injections: 1  Injection Site(s): Left lower quad. abdomen  Post Care: Patient declined observation  Discharge: Condition: Good, Destination: Home . AVS Provided  Performed by:  Wyvonne Lenz, RN

## 2024-02-20 ENCOUNTER — Encounter: Payer: Self-pay | Admitting: *Deleted

## 2024-02-20 DIAGNOSIS — Z006 Encounter for examination for normal comparison and control in clinical research program: Secondary | ICD-10-CM

## 2024-02-20 NOTE — Research (Signed)
 Vitals: []   No v/s due to phone call only Experience any AE/SAE/Hospitalizations []   If yes please explain:   Non-Fatal Potential Endpoint Assessment Yes  No   Has the subject experienced/undergone any of the following since the last visit/contact?   []   [x]    Any Coronary Artery Revascularization/Cerebrovascular Revascularization/ Peripheral Artery Revascularization/Amputation Procedure   []   [x]    Myocardial Infarction []   [x]    Stroke   []   [x]    Provide the date for the non-fatal Potential Endpoints status:   []   [x]     Current Outpatient Medications:    aspirin EC 81 MG tablet, Take by mouth., Disp: , Rfl:    losartan  (COZAAR ) 100 MG tablet, Take 1 tablet (100 mg total) by mouth daily., Disp: 90 tablet, Rfl: 3   SYNTHROID  75 MCG tablet, Take 1 tablet (75 mcg total) by mouth daily before breakfast., Disp: 90 tablet, Rfl: 3   tiZANidine (ZANAFLEX) 4 MG tablet, Take 1 tablet (4 mg total) by mouth every 6 (six) hours as needed for muscle spasms., Disp: 30 tablet, Rfl: 0   Vitamin D , Ergocalciferol , (DRISDOL ) 1.25 MG (50000 UNIT) CAPS capsule, Take 50,000 Units by mouth every 7 (seven) days., Disp: , Rfl:

## 2024-04-02 ENCOUNTER — Encounter: Payer: Self-pay | Admitting: *Deleted

## 2024-04-02 DIAGNOSIS — Z006 Encounter for examination for normal comparison and control in clinical research program: Secondary | ICD-10-CM

## 2024-04-02 NOTE — Research (Signed)
 Vitals: []   Not done phone call only  Experience any AE/SAE/Hospitalizations []   If yes please explain:   Non-Fatal Potential Endpoint Assessment Yes  No   Has the subject experienced/undergone any of the following since the last visit/contact?   []   [x]    Any Coronary Artery Revascularization/Cerebrovascular Revascularization/ Peripheral Artery Revascularization/Amputation Procedure   []   [x]    Myocardial Infarction []   [x]    Stroke   []   [x]    Provide the date for the non-fatal Potential Endpoints status:   04/02/2024   Thanked the patient for participating in the study.  Cardiologist has been contacted as what next steps are for this patient.  Subject completed

## 2024-04-17 ENCOUNTER — Encounter: Payer: Self-pay | Admitting: Advanced Practice Midwife

## 2024-06-17 ENCOUNTER — Ambulatory Visit (INDEPENDENT_AMBULATORY_CARE_PROVIDER_SITE_OTHER)

## 2024-06-17 VITALS — BP 158/73 | HR 61 | Temp 97.7°F | Resp 16 | Ht 65.0 in | Wt 147.4 lb

## 2024-06-17 DIAGNOSIS — E785 Hyperlipidemia, unspecified: Secondary | ICD-10-CM | POA: Diagnosis not present

## 2024-06-17 MED ORDER — INCLISIRAN SODIUM 284 MG/1.5ML ~~LOC~~ SOSY
284.0000 mg | PREFILLED_SYRINGE | Freq: Once | SUBCUTANEOUS | Status: AC
  Administered 2024-06-17: 284 mg via SUBCUTANEOUS
  Filled 2024-06-17: qty 1.5

## 2024-06-17 NOTE — Progress Notes (Signed)
 Diagnosis: Hyperlipidemia  Provider:  Mannam, Praveen MD  Procedure: Injection  Leqvio  (inclisiran), Dose: 284 mg, Site: subcutaneous, Number of injections: 1  Injection Site(s): Right lower quad. abdomne  Post Care: Patient declined observation  Discharge: Condition: Good, Destination: Home . AVS Declined  Performed by:  Rocky FORBES Sar, RN

## 2024-10-07 ENCOUNTER — Encounter: Payer: Self-pay | Admitting: Cardiology

## 2024-10-14 ENCOUNTER — Telehealth: Payer: Self-pay

## 2024-10-14 NOTE — Telephone Encounter (Signed)
 Auth Submission: NO AUTH NEEDED Site of care: Site of care: [facility] Payer: Medicare A/B Medication & CPT/J Code(s) submitted: Leqvio  (Inclisiran) J1306 Diagnosis Code:  Route of submission (phone, fax, portal):  Phone # Fax # Auth type: Buy/Bill PB Units/visits requested: 284mg  x 2 doses Reference number:  Approval from: 10/14/24 to 10/31/25   I called the patient and gave her the option to switch to [facility] based on her home address. She wishes to stay at [facility] for now but will look to see where [facility] is and will ask to switch at her next visit if she wishes.

## 2024-11-19 ENCOUNTER — Ambulatory Visit: Admitting: Cardiology

## 2024-12-16 ENCOUNTER — Ambulatory Visit
